# Patient Record
Sex: Male | Born: 1980 | Race: White | Hispanic: No | Marital: Married | Smoking: Former smoker
Health system: Southern US, Community
[De-identification: ages and names within clinical notes are randomized; demographics above are authoritative.]

## PROBLEM LIST (undated history)

## (undated) DIAGNOSIS — K449 Diaphragmatic hernia without obstruction or gangrene: Secondary | ICD-10-CM

## (undated) DIAGNOSIS — F419 Anxiety disorder, unspecified: Secondary | ICD-10-CM

## (undated) DIAGNOSIS — F329 Major depressive disorder, single episode, unspecified: Secondary | ICD-10-CM

## (undated) DIAGNOSIS — R002 Palpitations: Secondary | ICD-10-CM

## (undated) DIAGNOSIS — K200 Eosinophilic esophagitis: Secondary | ICD-10-CM

## (undated) DIAGNOSIS — K219 Gastro-esophageal reflux disease without esophagitis: Secondary | ICD-10-CM

## (undated) DIAGNOSIS — J8410 Pulmonary fibrosis, unspecified: Secondary | ICD-10-CM

## (undated) DIAGNOSIS — F32A Depression, unspecified: Secondary | ICD-10-CM

## (undated) DIAGNOSIS — J449 Chronic obstructive pulmonary disease, unspecified: Secondary | ICD-10-CM

## (undated) DIAGNOSIS — F99 Mental disorder, not otherwise specified: Secondary | ICD-10-CM

## (undated) DIAGNOSIS — F410 Panic disorder [episodic paroxysmal anxiety] without agoraphobia: Secondary | ICD-10-CM

## (undated) DIAGNOSIS — J45909 Unspecified asthma, uncomplicated: Secondary | ICD-10-CM

## (undated) DIAGNOSIS — M1990 Unspecified osteoarthritis, unspecified site: Secondary | ICD-10-CM

## (undated) HISTORY — DX: Unspecified asthma, uncomplicated: J45.909

## (undated) HISTORY — PX: FOREIGN BODY REMOVAL: SHX962

## (undated) HISTORY — DX: Mental disorder, not otherwise specified: F99

## (undated) HISTORY — DX: Unspecified osteoarthritis, unspecified site: M19.90

## (undated) HISTORY — PX: OTHER SURGICAL HISTORY: SHX169

---

## 2004-09-09 ENCOUNTER — Other Ambulatory Visit: Payer: Self-pay

## 2004-09-09 ENCOUNTER — Inpatient Hospital Stay: Payer: Self-pay

## 2005-09-20 ENCOUNTER — Ambulatory Visit: Payer: Self-pay | Admitting: Pulmonary Disease

## 2006-11-09 ENCOUNTER — Ambulatory Visit: Payer: Self-pay | Admitting: Specialist

## 2006-12-02 ENCOUNTER — Ambulatory Visit: Payer: Self-pay | Admitting: Specialist

## 2007-04-07 ENCOUNTER — Emergency Department: Payer: Self-pay | Admitting: Emergency Medicine

## 2009-04-13 ENCOUNTER — Emergency Department: Payer: Self-pay | Admitting: Emergency Medicine

## 2010-05-31 ENCOUNTER — Emergency Department: Payer: Self-pay | Admitting: Emergency Medicine

## 2010-12-18 ENCOUNTER — Emergency Department: Payer: Self-pay | Admitting: Internal Medicine

## 2011-03-07 ENCOUNTER — Emergency Department: Payer: Self-pay | Admitting: Emergency Medicine

## 2011-06-04 ENCOUNTER — Emergency Department: Payer: Self-pay | Admitting: Emergency Medicine

## 2011-09-06 ENCOUNTER — Emergency Department: Payer: Self-pay | Admitting: *Deleted

## 2012-08-18 ENCOUNTER — Emergency Department: Payer: Self-pay | Admitting: Emergency Medicine

## 2012-12-06 ENCOUNTER — Emergency Department (HOSPITAL_COMMUNITY)
Admission: EM | Admit: 2012-12-06 | Discharge: 2012-12-06 | Disposition: A | Discharge status: Home / Self Care | Payer: Medicare Other | Attending: Emergency Medicine | Admitting: Emergency Medicine

## 2012-12-06 ENCOUNTER — Encounter (HOSPITAL_COMMUNITY): Payer: Self-pay | Admitting: Emergency Medicine

## 2012-12-06 DIAGNOSIS — K625 Hemorrhage of anus and rectum: Secondary | ICD-10-CM | POA: Insufficient documentation

## 2012-12-06 DIAGNOSIS — K644 Residual hemorrhoidal skin tags: Secondary | ICD-10-CM | POA: Insufficient documentation

## 2012-12-06 DIAGNOSIS — Z79899 Other long term (current) drug therapy: Secondary | ICD-10-CM | POA: Insufficient documentation

## 2012-12-06 DIAGNOSIS — IMO0002 Reserved for concepts with insufficient information to code with codable children: Secondary | ICD-10-CM | POA: Insufficient documentation

## 2012-12-06 MED ORDER — HYDROCORTISONE 2.5 % RE CREA: TOPICAL_CREAM | RECTAL | Status: DC

## 2012-12-06 NOTE — ED Provider Notes (Signed)
History     CSN: [id number]  Arrival date & time 12/06/12  0205   First MD Initiated Contact with Patient 12/06/12 0231      Chief Complaint  Patient presents with  . Rectal Bleeding    (Consider location/radiation/quality/duration/timing/severity/associated sxs/prior treatment) HPI Steven [name] is a 32 y.o. male who presents to the Emergency Department complaining of rectal pain and bleeding. He has been using Tucs and Preparation H with no relief.  No past medical history on file.  No past surgical history on file.  No family history on file.  History  Substance Use Topics  . Smoking status: Not on file  . Smokeless tobacco: Not on file  . Alcohol Use: Not on file      Review of Systems  Constitutional: Negative for fever.       10 Systems reviewed and are negative for acute change except as noted in the HPI.  HENT: Negative for congestion.   Eyes: Negative for discharge and redness.  Respiratory: Negative for cough and shortness of breath.   Cardiovascular: Negative for chest pain.  Gastrointestinal: Negative for vomiting and abdominal pain.  Genitourinary:       Rectal pain and bleeding.  Musculoskeletal: Negative for back pain.  Skin: Negative for rash.  Neurological: Negative for syncope, numbness and headaches.  Psychiatric/Behavioral:       No behavior change.    Allergies  Review of patient's allergies indicates not on file.  Home Medications   Current Outpatient Rx  Name  Route  Sig  Dispense  Refill  . ACETAMINOPHEN 325 MG PO TABS   Oral   Take 650 mg by mouth every 6 (six) hours as needed.         . ALBUTEROL SULFATE HFA 108 (90 BASE) MCG/ACT IN AERS   Inhalation   Inhale 2 puffs into the lungs every 6 (six) hours as needed.         . ALBUTEROL SULFATE (5 MG/ML) 0.5% IN NEBU   Nebulization   Take 2.5 mg by nebulization every 6 (six) hours as needed.         Maximino Greenland 18-103 MCG/ACT IN AERO   Inhalation   Inhale 2  puffs into the lungs every 6 (six) hours as needed.         . ALPRAZOLAM 1 MG PO TABS   Oral   Take 1 mg by mouth at bedtime as needed.         Marland Kitchen CLONAZEPAM 1 MG PO TABS   Oral   Take 1 mg by mouth 2 (two) times daily as needed.         Marland Kitchen FEXOFENADINE HCL 180 MG PO TABS   Oral   Take 180 mg by mouth daily.         Marland Kitchen FLUTICASONE-SALMETEROL 500-50 MCG/DOSE IN AEPB   Inhalation   Inhale 1 puff into the lungs every 12 (twelve) hours.         . IPRATROPIUM-ALBUTEROL 0.5-2.5 (3) MG/3ML IN SOLN   Nebulization   Take 3 mLs by nebulization.         Marland Kitchen LEVALBUTEROL HCL 1.25 MG/3ML IN NEBU   Nebulization   Take 1.25 mg by nebulization every 4 (four) hours as needed.         Marland Kitchen OMEPRAZOLE 20 MG PO CPDR   Oral   Take 20 mg by mouth daily.         Marland Kitchen PAROXETINE HCL 10 MG PO TABS  Oral   Take 10 mg by mouth every morning.           BP 145/99  Pulse 115  Temp 98 F (36.7 C) (Oral)  Resp 18  Ht 5\' 8"  (1.727 m)  Wt 272 lb (123.378 kg)  BMI 41.36 kg/m2  SpO2 100%  Physical Exam  Nursing note and vitals reviewed. Constitutional:       Awake, alert, nontoxic appearance.  HENT:  Head: Atraumatic.  Eyes: Right eye exhibits no discharge. Left eye exhibits no discharge.  Neck: Neck supple.  Pulmonary/Chest: Effort normal. He exhibits no tenderness.  Abdominal: Soft. There is no tenderness. There is no rebound.  Genitourinary:       Genital and rectal exam performed with pt permission and male ED Tech chaparone present during exam.  Pt examined laying and standing.    Musculoskeletal: He exhibits no tenderness.       Baseline ROM, no obvious new focal weakness.  Neurological:       Mental status and motor strength appears baseline for patient and situation.  Skin: No rash noted.  Psychiatric: He has a normal mood and affect.    ED Course  Procedures (including critical care time)     MDM  Patient with external hemorrhoid that is inflamed, not thrombosed.  Rx for anusol cream. Pt stable in ED with no significant deterioration in condition.The patient appears reasonably screened and/or stabilized for discharge and I doubt any other medical condition or other [facility] requiring further screening, evaluation, or treatment in the ED at this time prior to discharge.  MDM Reviewed: nursing note and vitals           Nicoletta Dress. Colon Branch, MD 12/06/12 [phone]

## 2012-12-06 NOTE — ED Notes (Signed)
Pt was in the shower tonight when he noticed bleeding from his rectum, states he had had problems with hemmorhoids in the past.

## 2013-03-02 ENCOUNTER — Emergency Department: Payer: Self-pay | Admitting: Internal Medicine

## 2013-03-21 DIAGNOSIS — E041 Nontoxic single thyroid nodule: Secondary | ICD-10-CM | POA: Insufficient documentation

## 2013-08-02 DIAGNOSIS — R079 Chest pain, unspecified: Secondary | ICD-10-CM | POA: Insufficient documentation

## 2014-04-03 DIAGNOSIS — Z9109 Other allergy status, other than to drugs and biological substances: Secondary | ICD-10-CM | POA: Insufficient documentation

## 2015-03-17 ENCOUNTER — Encounter: Payer: Self-pay | Admitting: Internal Medicine

## 2015-03-17 ENCOUNTER — Other Ambulatory Visit: Payer: Self-pay | Admitting: *Deleted

## 2015-03-17 ENCOUNTER — Ambulatory Visit (INDEPENDENT_AMBULATORY_CARE_PROVIDER_SITE_OTHER): Payer: Medicare Other | Admitting: Internal Medicine

## 2015-03-17 VITALS — BP 110/88 | HR 86 | Temp 98.2°F | Ht 67.0 in | Wt 287.0 lb

## 2015-03-17 DIAGNOSIS — J4540 Moderate persistent asthma, uncomplicated: Secondary | ICD-10-CM | POA: Diagnosis not present

## 2015-03-17 DIAGNOSIS — E669 Obesity, unspecified: Secondary | ICD-10-CM | POA: Diagnosis not present

## 2015-03-17 DIAGNOSIS — J449 Chronic obstructive pulmonary disease, unspecified: Secondary | ICD-10-CM | POA: Insufficient documentation

## 2015-03-17 DIAGNOSIS — R0609 Other forms of dyspnea: Secondary | ICD-10-CM | POA: Diagnosis not present

## 2015-03-17 MED ORDER — FLUTICASONE FUROATE-VILANTEROL 200-25 MCG/INH IN AEPB: 200.0000 ug | INHALATION_SPRAY | Freq: Every day | RESPIRATORY_TRACT | Status: DC

## 2015-03-17 NOTE — Patient Instructions (Signed)
Follow up with Dr. Dema SeverinMungal in 3 months - pulmonary function testing and 6 minute walk test prior to follow up - BreoEllipta (200mcg)- 1 puff daily, gargle and rinse after each use.  - start walking, 30mins daily at least 3 times a week - avoid any form of smoking (tobacco, ecigs, etc) - stop chewing tobacco  - use your inhalers (maintenance inhalers) as directed - use nebulizer only for rescue purposes - for intense coughing, wheezing, and shortness breath

## 2015-03-17 NOTE — Progress Notes (Signed)
Date: 03/17/2015  MRN# [id number] Steven [name] Dec 24, 1980  Referring Physician: [facility], PA-C  Steven [name] is a 34 y.o. old male seen in consultation for asthma/COPD optimization  CC:  Chief Complaint  Patient presents with  . Advice Only    Pt referred COPD; pt reports cough,sob wheeze all the times.    HPI:  She is a 34 year old male presenting today for optimization of COPD/asthma. He has an extensive history of seen pulmonary and [facility] and at [facility], and the patient insistence for shortness of breath, he's had childhood asthma, and problems with controlling his asthma subsequent diagnosed with COPD along the line, and patient personally told that he should be evaluated for lung transplant in the past. He is currently on Advair 500/50 and Combivent. States that these meds are providing little relief, he is using his nebulizers constantly multiple times throughout the day for shortness of breath. He does have a history of smoking, mildly, in the past, but now is chewing tobacco. He states that he chews 1-1.5 cans per day. He is also noted to have nocturnal hypoxemia and wears 3 L of oxygen at night, this was diagnosed during a split-night study in 2010. He states that his last admission for breathing issues was 4 years ago, he was seen in ED/urgent care earlier this year and was given "couple of breathing treatments." He states that his breathing is worse during the winter, all with rapid seasonal changes. Today he does endorse shortness of breath with exertion, but no cough, no fever, no night sweats. He is currently unemployed, has a pet dull, states that he does not do much exercise or is not that mobile. His father had pulmonary fibrosis, and his mother was diagnosed with COPD.  Patient's chart was reviewed in great detail, 35 minutes spent reviewing records. 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 Also allergies, sees an allergy specialist in [place] [facility], previously on immunotherapy but got sick with each shot and so discontinued. Comorbidities include anxiety, depression, and panic attacks, as well as a cardiac arrhythmia and hypertrophy. No recent hospitalizations - in 2001 was intubated for anaphylactic reaction to benadryl. Now presents to the hospital 2-4x/year with asthma/COPD exacerbation, gets breathing treatments and steroids, has not needed bipap/cpap. In the last 2 years has had frequent presentations to his pcp as well, needing a steroid taper. Had a sleep study for sleep apnea but did not sleep well enough; has been given oxygen for sleeping for 4+ years but no cpap. Still having a lot of trouble with sleeping.  Symptoms are SOB, wheezing, chest tightness. Also notices palpitations seconds - minutes and has reported atypical chest pain. Activity limited to walking halfway through the grocery store (on good days) walking to the bathroom on bad days, and will present to his pcp when he has resting sob.  Pertinent Test Results from [facility]:  CT non con 08/2013 FINDINGS:The partially visualized thyroid gland is unremarkable.Small amount of residual thymus is noted in the anterior mediastinum. Tiny mediastinal nodes are noted. There is no mediastinal, hilar, or axillary lymphadenopathy.The heart size is normal. There is no pericardial effusion.There is no focal airspace consolidation. Dependent atelectasis is noted on both prone and supine images. There is no pneumothorax or pleural effusion. No pulmonary nodules or lung masses are identified.  There is no reticulation, honeycombing, or evidence of interstitial lung disease.The partially visualized upper abdomen is unremarkable.The osseous structures are unremarkable. [facility] No  evidence of interstitial lung disease.  CT chest 2003 No evidence of pulmonary parenchymal disease.   CXR 2012 IMPRESSION: Lungs clear.   PET myocardial perfusion 08/29/2013 - Normal myocardial perfusion study - No evidence for significant ischemia or scar is noted. - During stress: Global systolic function is low normal. The ejection  fraction calculated at 51%.  - No significant coronary calcifications were noted on the attenuation CT  Echo 08/01/2013 Technically suboptimal study due to chest wall and lung interference Contractile left ventricular dysfunction (mild) Segmental contractile left ventricular dysfunction (see detail below) Decreased left ventricular ejection fraction (45-50%) Diastolic left ventricular dysfunction Degenerative mitral valve disease Dilated left atrium Normal right ventricular contractile performance  Steven [name] is a 32 yom with severely decreased activity tolerance and disability 2/2 severe asthma, worsened by comorbid deconditioning, allergies, GERD, and possible sleep apnea.  Pt instructions: 1) Asthma Increase advair 500/50 2puffs twice A day   2) Morbid obesity Pulmonary rehab nutrition  3) allergeis allegra in daytime Start singulair flonase nasl sinus irrigation 2 times a day  4) manage GERD       PMHX:   Past Medical History  Diagnosis Date  . Asthma   . Arthritis     knee  . Chronic mental illness    Surgical Hx:  Past Surgical History  Procedure Laterality Date  . None     Family Hx:  Family History  Problem Relation Age of Onset  . COPD Mother   . Asthma Mother   . Mental illness Mother   . COPD Father   . Pulmonary fibrosis Father   . Lung cancer Father    Social Hx:   History  Substance Use Topics  . Smoking status: Former Smoker -- 0.30 packs/day for 3 years    Types: Cigarettes  . Smokeless tobacco: Current User    Types: Chew     Comment: 2003  . Alcohol Use: No   Medication:    Current Outpatient Rx  Name  Route  Sig  Dispense  Refill  . albuterol (PROVENTIL HFA;VENTOLIN HFA) 108 (90 BASE) MCG/ACT inhaler   Inhalation   Inhale 2 puffs into the lungs every 6 (six) hours as needed.         Marland Kitchen albuterol (PROVENTIL) (5 MG/ML) 0.5% nebulizer solution   Nebulization   Take 2.5 mg by nebulization every 6 (six) hours as needed.         Marland Kitchen albuterol-ipratropium (COMBIVENT) 18-103 MCG/ACT inhaler   Inhalation   Inhale 2 puffs into the lungs every 6 (six) hours as needed.         . clonazePAM (KLONOPIN) 1 MG tablet   Oral   Take 1 mg by mouth 2 (two) times daily as needed.         Marland Kitchen escitalopram (LEXAPRO) 20 MG tablet   Oral   Take 20 mg by mouth daily.         . fexofenadine (ALLEGRA) 180 MG tablet   Oral   Take 180 mg by mouth daily.         . Fluticasone-Salmeterol (ADVAIR) 500-50 MCG/DOSE AEPB   Inhalation   Inhale 1 puff into the lungs every 12 (twelve) hours.         Marland Kitchen ipratropium-albuterol (DUONEB) 0.5-2.5 (3) MG/3ML SOLN   Nebulization   Take  3 mLs by nebulization.         Marland Kitchen. levalbuterol (XOPENEX) 1.25 MG/3ML nebulizer solution   Nebulization   Take 1.25 mg by nebulization every 4 (four) hours as needed.         Marland Kitchen. omeprazole (PRILOSEC) 20 MG capsule   Oral   Take 20 mg by mouth daily.         . traMADol (ULTRAM) 50 MG tablet   Oral   Take 50 mg by mouth as needed.         Marland Kitchen. acetaminophen (TYLENOL) 325 MG tablet   Oral   Take 650 mg by mouth every 6 (six) hours as needed.             Allergies:  Diphenhydramine; Guaifenesin; and Amoxicillin  Review of Systems: Gen:  Denies  fever, sweats, chills HEENT: Denies blurred vision, double vision, ear pain, eye pain, hearing loss, nose bleeds, sore throat Cvc:  No dizziness, chest pain or heaviness Resp:   Denies cough or sputum porduction, shortness of breath Gi: Denies swallowing difficulty, stomach pain, nausea or vomiting, diarrhea, constipation, bowel  incontinence Gu:  Denies bladder incontinence, burning urine Ext:   No Joint pain, stiffness or swelling Skin: No skin rash, easy bruising or bleeding or hives Endoc:  No polyuria, polydipsia , polyphagia or weight change Psych: No depression, insomnia or hallucinations  Other:  All other systems negative  Physical Examination:   VS: BP 110/88 mmHg  Pulse 86  Temp(Src) 98.2 F (36.8 C) (Oral)  Ht 5\' 7"  (1.702 m)  Wt 287 lb (130.182 kg)  BMI 44.94 kg/m2  SpO2 94%  General Appearance: No distress  Neuro:without focal findings, mental status, speech normal, alert and oriented, cranial nerves 2-12 intact, reflexes normal and symmetric, sensation grossly normal  HEENT: PERRLA, EOM intact, no ptosis, no other lesions noticed Pulmonary: normal breath sounds., diaphragmatic excursion normal.No wheezing, No rales;   Sputum Production:  None CardiovascularNormal S1,S2.  No m/r/g.  Abdominal aorta pulsation normal.    Abdomen: Benign, Soft, non-tender, No masses, hepatosplenomegaly, No lymphadenopathy Renal:  No costovertebral tenderness  GU:  No performed at this time. Endoc: No evident thyromegaly, no signs of acromegaly or Cushing features Skin:   warm, no rashes, no ecchymosis  Extremities: normal, no cyanosis, clubbing, no edema, warm with normal capillary refill. Other findings: None  Assessment and Plan:Ramsey L Marvel PlanWhitehead is a 32 yom with severely decreased activity tolerance and disability 2/2 severe asthma, worsened by comorbid deconditioning, allergies, GERD, seen in consultation today for optimization of asthma/COPD. Asthma, chronic Patient's primary problem seems to be with controlling his asthma, and having a good understanding of asthma, its triggers, and control and use of rescue inhalers. He states that he's had childhood asthma and has had difficulty controlling it, [facility], with extensive workup of multiple CAT scans,  obstructive sleep apnea workup, cardiac echo. I believe that immobility, deconditioning, obesity and bad habits such as chewing tobacco and lack of exercise with poor dietary control, it is adding to his asthma issues and making it more difficult to control. Patient visit was educational, and I spent 40 minutes going over his history, current plan of care, and counseling.  Plan: -Pulmonary function testing in 6 to walk test prior to follow-up visit. -Patient states that Advair 500/50 is providing much benefit, will try Breo (200) 9-increased daily walking, at least 3 times per week, -Avoid any form of tobacco  smoking which he may include E cigarettes, cigars, vapors, etc. Stop chewing tobacco Patient was educated extensively in the use of his inhalers, rescue versus maintenance inhaler. He verbalizes understanding of both types. Patient was also educated on when to use his nebulizer, basically for intense coughing, wheezing, and shortness of breath.   Chronic obstructive airway disease with asthma Patient counseled extensively on the use of tobacco,. I believe that he has more asthma versus COPD, will further evaluate with pulmonary function testing.  See plan for asthma.   Obesity OBESITY  Discussed importance of weight reduction.  Educated regarding limitation of  intake of greasy/fried foods.  Instructed on benefit of  a low-impact exercise program, starting slowly.  Discussed benefits of 30-45 minutes of some form of exercise daily as well as benefit of supervised exercise program.  Advised patient to start out slowly, walking 30 minutes, at least 3 times per week.    DOE (dyspnea on exertion) Defect or renal: COPD, asthma, poorly controlled obstructive lung disease, obesity, deconditioning, immobility, poor health maintenance.  See plan for asthma, COPD, obesity.     Updated Medication List Outpatient Encounter Prescriptions as of 03/17/2015  Medication Sig  . albuterol  (PROVENTIL HFA;VENTOLIN HFA) 108 (90 BASE) MCG/ACT inhaler Inhale 2 puffs into the lungs every 6 (six) hours as needed.  Marland Kitchen. albuterol (PROVENTIL) (5 MG/ML) 0.5% nebulizer solution Take 2.5 mg by nebulization every 6 (six) hours as needed.  Marland Kitchen. albuterol-ipratropium (COMBIVENT) 18-103 MCG/ACT inhaler Inhale 2 puffs into the lungs every 6 (six) hours as needed.  . clonazePAM (KLONOPIN) 1 MG tablet Take 1 mg by mouth 2 (two) times daily as needed.  Marland Kitchen. escitalopram (LEXAPRO) 20 MG tablet Take 20 mg by mouth daily.  . fexofenadine (ALLEGRA) 180 MG tablet Take 180 mg by mouth daily.  . Fluticasone-Salmeterol (ADVAIR) 500-50 MCG/DOSE AEPB Inhale 1 puff into the lungs every 12 (twelve) hours.  Marland Kitchen. ipratropium-albuterol (DUONEB) 0.5-2.5 (3) MG/3ML SOLN Take 3 mLs by nebulization.  Marland Kitchen. levalbuterol (XOPENEX) 1.25 MG/3ML nebulizer solution Take 1.25 mg by nebulization every 4 (four) hours as needed.  Marland Kitchen. omeprazole (PRILOSEC) 20 MG capsule Take 20 mg by mouth daily.  . traMADol (ULTRAM) 50 MG tablet Take 50 mg by mouth as needed.  . [DISCONTINUED] PARoxetine (PAXIL) 10 MG tablet Take 10 mg by mouth every morning.  Marland Kitchen. acetaminophen (TYLENOL) 325 MG tablet Take 650 mg by mouth every 6 (six) hours as needed.  . [DISCONTINUED] ALPRAZolam (XANAX) 1 MG tablet Take 1 mg by mouth at bedtime as needed.  . [DISCONTINUED] hydrocortisone (ANUSOL-HC) 2.5 % rectal cream Apply rectally 2 times daily (Patient not taking: Reported on 03/17/2015)   No facility-administered encounter medications on file as of 03/17/2015.    Orders for this visit: Orders Placed This Encounter  Procedures  . Pulmonary function test    Standing Status: Future     Number of Occurrences:      Standing Expiration Date: 03/16/2016    Scheduling Instructions:     [place] office    Order Specific Question:  Where should this test be performed?    Answer:  [facility]    Order Specific Question:  Full PFT: includes the following: basic spirometry,  spirometry pre & post bronchodilator, diffusion capacity (DLCO), lung volumes    Answer:  Full PFT    Order Specific Question:  MIP/MEP    Answer:  No    Order Specific Question:  6 minute walk    Answer:  Yes  Order Specific Question:  ABG    Answer:  No    Order Specific Question:  Diffusion capacity (DLCO)    Answer:  No    Order Specific Question:  Lung volumes    Answer:  No    Order Specific Question:  Methacholine challenge    Answer:  No     Thank  you for the consultation and for allowing [facility] to assist in the care of your patient. Our recommendations are noted above.  Please contact us if we can be of further service.   Stephanie Acre, MD [facility] Office Number: [phone]

## 2015-04-05 DIAGNOSIS — J45909 Unspecified asthma, uncomplicated: Secondary | ICD-10-CM | POA: Insufficient documentation

## 2015-04-05 NOTE — Assessment & Plan Note (Signed)
Patient counseled extensively on the use of tobacco,. I believe that he has more asthma versus COPD, will further evaluate with pulmonary function testing.  See plan for asthma.

## 2015-04-05 NOTE — Assessment & Plan Note (Signed)
OBESITY  Discussed importance of weight reduction.  Educated regarding limitation of  intake of greasy/fried foods.  Instructed on benefit of  a low-impact exercise program, starting slowly.  Discussed benefits of 30-45 minutes of some form of exercise daily as well as benefit of supervised exercise program.  Advised patient to start out slowly, walking 30 minutes, at least 3 times per week.

## 2015-04-05 NOTE — Assessment & Plan Note (Signed)
Patient's primary problem seems to be with controlling his asthma, and having a good understanding of asthma, its triggers, and control and use of rescue inhalers. He states that he's had childhood asthma and has had difficulty controlling it, [facility], with extensive workup of multiple CAT scans, obstructive sleep apnea workup, cardiac echo. I believe that immobility, deconditioning, obesity and bad habits such as chewing tobacco and lack of exercise with poor dietary control, it is adding to his asthma issues and making it more difficult to control. Patient visit was educational, and I spent 40 minutes going over his history, current plan of care, and counseling.  Plan: -Pulmonary function testing in 6 to walk test prior to follow-up visit. -Patient states that Advair 500/50 is providing much benefit, will try Breo (200) 9-increased daily walking, at least 3 times per week, -Avoid any form of tobacco smoking which he may include E cigarettes, cigars, vapors, etc. Stop chewing tobacco Patient was educated extensively in the use of his inhalers, rescue versus maintenance inhaler. He verbalizes understanding of both types. Patient was also educated on when to use his nebulizer, basically for intense coughing, wheezing, and shortness of breath.

## 2015-04-05 NOTE — Assessment & Plan Note (Signed)
Defect or renal: COPD, asthma, poorly controlled obstructive lung disease, obesity, deconditioning, immobility, poor health maintenance.  See plan for asthma, COPD, obesity.

## 2015-05-26 ENCOUNTER — Other Ambulatory Visit: Payer: Self-pay | Admitting: Sports Medicine

## 2015-05-26 DIAGNOSIS — E049 Nontoxic goiter, unspecified: Secondary | ICD-10-CM

## 2015-06-01 ENCOUNTER — Ambulatory Visit
Admission: RE | Admit: 2015-06-01 | Discharge: 2015-06-01 | Disposition: A | Discharge status: Home / Self Care | Payer: Medicare Other | Source: Ambulatory Visit | Attending: Sports Medicine | Admitting: Sports Medicine

## 2015-06-01 DIAGNOSIS — R221 Localized swelling, mass and lump, neck: Secondary | ICD-10-CM | POA: Diagnosis not present

## 2015-06-01 DIAGNOSIS — E049 Nontoxic goiter, unspecified: Secondary | ICD-10-CM | POA: Diagnosis present

## 2015-07-11 ENCOUNTER — Emergency Department (HOSPITAL_COMMUNITY): Payer: Medicare Other

## 2015-07-11 ENCOUNTER — Encounter (HOSPITAL_COMMUNITY): Payer: Self-pay | Admitting: Emergency Medicine

## 2015-07-11 ENCOUNTER — Emergency Department (HOSPITAL_COMMUNITY)
Admission: EM | Admit: 2015-07-11 | Discharge: 2015-07-11 | Disposition: A | Discharge status: Home / Self Care | Payer: Medicare Other | Attending: Emergency Medicine | Admitting: Emergency Medicine

## 2015-07-11 DIAGNOSIS — Z87891 Personal history of nicotine dependence: Secondary | ICD-10-CM | POA: Diagnosis not present

## 2015-07-11 DIAGNOSIS — F99 Mental disorder, not otherwise specified: Secondary | ICD-10-CM | POA: Insufficient documentation

## 2015-07-11 DIAGNOSIS — M1990 Unspecified osteoarthritis, unspecified site: Secondary | ICD-10-CM | POA: Insufficient documentation

## 2015-07-11 DIAGNOSIS — Z79899 Other long term (current) drug therapy: Secondary | ICD-10-CM | POA: Insufficient documentation

## 2015-07-11 DIAGNOSIS — R0602 Shortness of breath: Secondary | ICD-10-CM | POA: Diagnosis present

## 2015-07-11 DIAGNOSIS — Z7951 Long term (current) use of inhaled steroids: Secondary | ICD-10-CM | POA: Insufficient documentation

## 2015-07-11 DIAGNOSIS — J45901 Unspecified asthma with (acute) exacerbation: Secondary | ICD-10-CM

## 2015-07-11 DIAGNOSIS — J441 Chronic obstructive pulmonary disease with (acute) exacerbation: Secondary | ICD-10-CM | POA: Diagnosis not present

## 2015-07-11 HISTORY — DX: Pulmonary fibrosis, unspecified: J84.10

## 2015-07-11 HISTORY — DX: Chronic obstructive pulmonary disease, unspecified: J44.9

## 2015-07-11 MED ORDER — BENZONATATE 100 MG PO CAPS: 100.0000 mg | ORAL_CAPSULE | Freq: Three times a day (TID) | ORAL | Status: DC

## 2015-07-11 MED ORDER — PREDNISONE 20 MG PO TABS: 20.0000 mg | ORAL_TABLET | Freq: Two times a day (BID) | ORAL | Status: DC

## 2015-07-11 MED ORDER — ALBUTEROL (5 MG/ML) CONTINUOUS INHALATION SOLN
10.0000 mg/h | INHALATION_SOLUTION | Freq: Once | RESPIRATORY_TRACT | Status: AC
  Administered 2015-07-11: 10 mg/h via RESPIRATORY_TRACT
  Filled 2015-07-11: qty 20

## 2015-07-11 MED ORDER — METHYLPREDNISOLONE SODIUM SUCC 125 MG IJ SOLR
125.0000 mg | Freq: Once | INTRAMUSCULAR | Status: AC
  Administered 2015-07-11: 125 mg via INTRAVENOUS
  Filled 2015-07-11: qty 2

## 2015-07-11 MED ORDER — METHYLPREDNISOLONE SODIUM SUCC 125 MG IJ SOLR
INTRAMUSCULAR | Status: AC
  Filled 2015-07-11: qty 2

## 2015-07-11 NOTE — ED Notes (Signed)
Respiratory paged for continuous neb tx.

## 2015-07-11 NOTE — ED Provider Notes (Signed)
CSN: [id number]     Arrival date & time 07/11/15  1552 History   First MD Initiated Contact with Patient 07/11/15 1645     Chief Complaint  Patient presents with  . Shortness of Breath      HPI  Patient presents for evaluation of difficulty breathing. Long history of COPD/asthma. States he's been flared up for about 2 days with a dry cough and using home nebulizer and states he only gets temporary and incomplete relief. Dry nonproductive cough. No fever. Some subcostal pain from coughing. No hemoptysis or sputum production. No additional symptoms.  Past Medical History  Diagnosis Date  . Asthma   . Arthritis     knee  . Chronic mental illness   . COPD (chronic obstructive pulmonary disease)   . Pulmonary fibrosis    Past Surgical History  Procedure Laterality Date  . None     Family History  Problem Relation Age of Onset  . COPD Mother   . Asthma Mother   . Mental illness Mother   . COPD Father   . Pulmonary fibrosis Father   . Lung cancer Father    Social History  Substance Use Topics  . Smoking status: Former Smoker -- 0.30 packs/day for 3 years    Types: Cigarettes  . Smokeless tobacco: Current User    Types: Chew     Comment: 2003  . Alcohol Use: No    Review of Systems  Constitutional: Negative for fever, chills, diaphoresis, appetite change and fatigue.  HENT: Negative for mouth sores, sore throat and trouble swallowing.   Eyes: Negative for visual disturbance.  Respiratory: Positive for cough, shortness of breath and wheezing. Negative for chest tightness.   Cardiovascular: Negative for chest pain.  Gastrointestinal: Negative for nausea, vomiting, abdominal pain, diarrhea and abdominal distention.  Endocrine: Negative for polydipsia, polyphagia and polyuria.  Genitourinary: Negative for dysuria, frequency and hematuria.  Musculoskeletal: Negative for gait problem.  Skin: Negative for color change, pallor and rash.  Neurological: Negative for dizziness,  syncope, light-headedness and headaches.  Hematological: Does not bruise/bleed easily.  Psychiatric/Behavioral: Negative for behavioral problems and confusion.      Allergies  Diphenhydramine; Guaifenesin; and Amoxicillin  Home Medications   Prior to Admission medications   Medication Sig Start Date End Date Taking? Authorizing Provider  acetaminophen (TYLENOL) 500 MG tablet Take 1,000 mg by mouth every 6 (six) hours as needed for moderate pain.   Yes Historical Provider, MD  albuterol (PROVENTIL HFA;VENTOLIN HFA) 108 (90 BASE) MCG/ACT inhaler Inhale 2 puffs into the lungs every 6 (six) hours as needed.   Yes Historical Provider, MD  albuterol (PROVENTIL) (5 MG/ML) 0.5% nebulizer solution Take 2.5 mg by nebulization every 6 (six) hours as needed.   Yes Historical Provider, MD  albuterol-ipratropium (COMBIVENT) 18-103 MCG/ACT inhaler Inhale 2 puffs into the lungs every 6 (six) hours as needed.   Yes Historical Provider, MD  clonazePAM (KLONOPIN) 1 MG tablet Take 1 mg by mouth 2 (two) times daily as needed.   Yes Historical Provider, MD  escitalopram (LEXAPRO) 20 MG tablet Take 20 mg by mouth daily. 02/18/15  Yes Historical Provider, MD  fexofenadine (ALLEGRA) 180 MG tablet Take 180 mg by mouth daily.   Yes Historical Provider, MD  fluticasone (FLONASE) 50 MCG/ACT nasal spray Place 1 spray into both nostrils daily. 06/23/15  Yes Historical Provider, MD  Fluticasone-Salmeterol (ADVAIR) 500-50 MCG/DOSE AEPB Inhale 1 puff into the lungs every 12 (twelve) hours.   Yes Historical Provider,  MD  ipratropium-albuterol (DUONEB) 0.5-2.5 (3) MG/3ML SOLN Take 3 mLs by nebulization every 4 (four) hours as needed (Shortness of Breath).    Yes Historical Provider, MD  omeprazole (PRILOSEC) 20 MG capsule Take 20 mg by mouth daily.   Yes Historical Provider, MD  benzonatate (TESSALON) 100 MG capsule Take 1 capsule (100 mg total) by mouth every 8 (eight) hours. 07/11/15   Rolland Porter, MD  Fluticasone  Furoate-Vilanterol (BREO ELLIPTA) 200-25 MCG/INH AEPB Inhale 200 mcg into the lungs daily. Rinse and gargle after each use. 03/17/15   Vishal Mungal, MD  predniSONE (DELTASONE) 20 MG tablet Take 1 tablet (20 mg total) by mouth 2 (two) times daily with a meal. 07/11/15   Rolland Porter, MD  traMADol (ULTRAM) 50 MG tablet Take 50 mg by mouth as needed. 02/16/15   Historical Provider, MD   BP 105/56 mmHg  Pulse 85  Temp(Src) 98.2 F (36.8 C) (Oral)  Resp 16  Ht 5\' 7"  (1.702 m)  Wt 270 lb (122.471 kg)  BMI 42.28 kg/m2  SpO2 100% Physical Exam  Constitutional: He is oriented to person, place, and time. He appears well-developed and well-nourished. No distress.  HENT:  Head: Normocephalic.  Eyes: Conjunctivae are normal. Pupils are equal, round, and reactive to light. No scleral icterus.  Neck: Normal range of motion. Neck supple. No thyromegaly present.  Cardiovascular: Normal rate and regular rhythm.  Exam reveals no gallop and no friction rub.   No murmur heard. Pulmonary/Chest: Effort normal. No accessory muscle usage. No respiratory distress. He has wheezes. He has no rales.  Globally diminished breath sounds with wheezing and prolongation in all fields.  Abdominal: Soft. Bowel sounds are normal. He exhibits no distension. There is no tenderness. There is no rebound.  Musculoskeletal: Normal range of motion.  Neurological: He is alert and oriented to person, place, and time.  Skin: Skin is warm and dry. No rash noted.  Psychiatric: He has a normal mood and affect. His behavior is normal.    ED Course  Procedures (including critical care time) Labs Review Labs Reviewed - No data to display  Imaging Review Dg Chest 2 View  07/11/2015   CLINICAL DATA:  Shortness of breath and dry cough since yesterday. History of asthma.  EXAM: CHEST  2 VIEW  COMPARISON:  None.  FINDINGS: The heart size and mediastinal contours are within normal limits. Both lungs are clear. The visualized skeletal  structures are unremarkable. Mild scarring at the LEFT base without effusion is chronic.  IMPRESSION: No active cardiopulmonary disease.   Electronically Signed   By: Elsie Stain M.D.   On: 07/11/2015 16:50   I have personally reviewed and evaluated these images and lab results as part of my medical decision-making.   EKG Interpretation None      MDM   Final diagnoses:  Asthma exacerbation    Plan chest x-ray, IV steroids, nebulized albuterol continuous over one hour. Reevaluation.  CRITICAL CARE Performed by: Rolland Porter JOSEPH   Total critical care time: 60 minutes of continuous albuterol nebulizer.  Critical care time was exclusive of separately billable procedures and treating other patients.  Critical care was necessary to treat or prevent imminent or life-threatening deterioration.  Critical care was time spent personally by me on the following activities: development of treatment plan with patient and/or surrogate as well as nursing, discussions with consultants, evaluation of patient's response to treatment, examination of patient, obtaining history from patient or surrogate, ordering and performing treatments and  interventions, ordering and review of laboratory studies, ordering and review of radiographic studies, pulse oximetry and re-evaluation of patient's condition.   18:52:  Patient underwent one hour of continuous nebulized albuterol and IV sterile 8. X-ray shows no acute findings. Clear lungs well oxygenated area and good symptom relief. He is appropriate for discharge home. Prescription prednisone, Tessalon for cough, primary care follow-up.  Rolland Porter, MD 07/11/15 [phone]

## 2015-07-11 NOTE — ED Notes (Signed)
MD James at bedside.  

## 2015-07-11 NOTE — Discharge Instructions (Signed)

## 2015-07-11 NOTE — ED Notes (Signed)
Went into room to discharge pt, pt states that he feels the same as when he came into the er, Dr Fayrene Fearing notified, in room to speak with pt,

## 2015-07-11 NOTE — ED Notes (Signed)
Pt c/o sob and dry cough since yesterday. No relief from home nebulizer. H/s asthma/copd.

## 2015-08-31 ENCOUNTER — Ambulatory Visit: Payer: Medicare Other

## 2015-08-31 ENCOUNTER — Ambulatory Visit: Payer: Medicare Other | Admitting: Internal Medicine

## 2015-11-16 ENCOUNTER — Ambulatory Visit (INDEPENDENT_AMBULATORY_CARE_PROVIDER_SITE_OTHER): Payer: Medicare Other | Admitting: *Deleted

## 2015-11-16 DIAGNOSIS — R0609 Other forms of dyspnea: Secondary | ICD-10-CM | POA: Diagnosis not present

## 2015-11-16 DIAGNOSIS — J4540 Moderate persistent asthma, uncomplicated: Secondary | ICD-10-CM

## 2015-11-16 LAB — PULMONARY FUNCTION TEST
DL/VA % pred: 112 %
DL/VA: 4.97 ml/min/mmHg/L
DLCO UNC % PRED: 101 %
DLCO UNC: 27.37 ml/min/mmHg
FEF 25-75 Post: 1.14 L/sec
FEF 25-75 Pre: 0.77 L/sec
FEF2575-%CHANGE-POST: 48 %
FEF2575-%PRED-POST: 28 %
FEF2575-%PRED-PRE: 19 %
FEV1-%CHANGE-POST: 20 %
FEV1-%PRED-POST: 51 %
FEV1-%Pred-Pre: 42 %
FEV1-POST: 2 L
FEV1-Pre: 1.66 L
FEV1FVC-%CHANGE-POST: 10 %
FEV1FVC-%Pred-Pre: 61 %
FEV6-%Change-Post: 11 %
FEV6-%PRED-PRE: 69 %
FEV6-%Pred-Post: 77 %
FEV6-PRE: 3.28 L
FEV6-Post: 3.67 L
FEV6FVC-%Change-Post: 2 %
FEV6FVC-%PRED-PRE: 98 %
FEV6FVC-%Pred-Post: 101 %
FVC-%Change-Post: 9 %
FVC-%PRED-PRE: 70 %
FVC-%Pred-Post: 76 %
FVC-PRE: 3.36 L
FVC-Post: 3.67 L
POST FEV1/FVC RATIO: 54 %
POST FEV6/FVC RATIO: 100 %
Pre FEV1/FVC ratio: 49 %
Pre FEV6/FVC Ratio: 98 %
RV % PRED: 106 %
RV: 1.55 L
TLC % pred: 91 %
TLC: 5.61 L

## 2015-11-16 NOTE — Progress Notes (Signed)
PFT performed today. 

## 2015-11-16 NOTE — Progress Notes (Signed)
SMW performed today. 

## 2015-11-17 ENCOUNTER — Ambulatory Visit: Payer: Medicare Other | Admitting: Internal Medicine

## 2015-11-19 ENCOUNTER — Encounter: Payer: Self-pay | Admitting: *Deleted

## 2015-11-19 ENCOUNTER — Ambulatory Visit: Payer: Medicare Other | Admitting: Internal Medicine

## 2015-12-10 ENCOUNTER — Ambulatory Visit (INDEPENDENT_AMBULATORY_CARE_PROVIDER_SITE_OTHER): Payer: Medicare Other | Admitting: Internal Medicine

## 2015-12-10 ENCOUNTER — Encounter: Payer: Self-pay | Admitting: Internal Medicine

## 2015-12-10 VITALS — BP 122/70 | HR 110 | Ht 66.0 in | Wt 276.8 lb

## 2015-12-10 DIAGNOSIS — R0609 Other forms of dyspnea: Secondary | ICD-10-CM

## 2015-12-10 DIAGNOSIS — J4540 Moderate persistent asthma, uncomplicated: Secondary | ICD-10-CM

## 2015-12-10 DIAGNOSIS — E669 Obesity, unspecified: Secondary | ICD-10-CM

## 2015-12-10 MED ORDER — FLUTICASONE FUROATE-VILANTEROL 200-25 MCG/INH IN AEPB: 1.0000 | INHALATION_SPRAY | Freq: Every day | RESPIRATORY_TRACT | Status: AC

## 2015-12-10 MED ORDER — FLUTICASONE FUROATE-VILANTEROL 200-25 MCG/INH IN AEPB: 1.0000 | INHALATION_SPRAY | Freq: Every day | RESPIRATORY_TRACT | Status: DC

## 2015-12-10 NOTE — Patient Instructions (Addendum)
Follow up with Dr. Dema Severin in: 6 weeks - stop Advair, combivent, atrovent - we will start you on BreoEllipta (200/25)- 1puff daily, gargle and rinse after each use - please limit the use of albuterol?Duoneb to urgent cases - wheezing and significant shortness of breath. Continued use of albuterol can cause palpitations - keep follow up with your cardiologist at [facility] - per records you have an appt on 12/15/15 with Dr. Morton Peters, and again on 12/31/15 with Dr. Jaymes Graff - we will make a referral for you to start Pulmonary Rehab  - please try to start this prior to your next visit.

## 2015-12-10 NOTE — Assessment & Plan Note (Signed)
OBESITY  Discussed importance of weight reduction.  Educated regarding limitation of  intake of greasy/fried foods.  Instructed on benefit of  a low-impact exercise program, starting slowly.  Discussed benefits of 30-45 minutes of some form of exercise daily as well as benefit of supervised exercise program.  Advised patient to start out slowly, walking 30 minutes, at least 3 times per week.  

## 2015-12-10 NOTE — Progress Notes (Signed)
. Date: 12/10/2015  MRN# [id number] Steven [name] October 14, 1981  Referring Physician: [facility], PA-C  Steven [name] is a 35 y.o. old male seen in consultation for asthma/COPD optimization  CC:  Chief Complaint  Patient presents with  . Follow-up    pt. states breathing has worsen. increased SOB X3d occ. chest pain/tightness X1wk. occ. wheezing .     HPI:  She is a 35 year old male presenting today for optimization of COPD/asthma. He has an extensive history of seen [name] and [facility] and at [facility], and the patient insistence for shortness of breath, he's had childhood asthma, and problems with controlling his asthma subsequent diagnosed with COPD along the line, and patient personally told that he should be evaluated for lung transplant in the past. He is currently on Advair 500/50 and Combivent. States that these meds are providing little relief, he is using his nebulizers constantly multiple times throughout the day for shortness of breath. He does have a history of smoking, mildly, in the past, but now is chewing tobacco. He states that he chews 1-1.5 cans per day. He is also noted to have nocturnal hypoxemia and wears 3 L of oxygen at night, this was diagnosed during a split-night study in 2010. He states that his last admission for breathing issues was 4 years ago, he was seen in ED/urgent [name] earlier this year and was given "couple of breathing treatments." He states that his breathing is worse during the winter, all with rapid seasonal changes. Today he does endorse shortness of breath with exertion, but no cough, no fever, no night sweats. He is currently unemployed, has a pet dull, states that he does not do much exercise or is not that mobile. His father had [name] fibrosis, and his mother was diagnosed with COPD.  Patient's chart was reviewed in great detail, 35 minutes spent reviewing records. 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 Also allergies, sees an allergy specialist in [facility], previously on immunotherapy but got sick with each shot and so discontinued. Comorbidities include anxiety, depression, and panic attacks, as well as a cardiac arrhythmia and hypertrophy. No recent hospitalizations - in 2001 was intubated for anaphylactic reaction to benadryl. Now presents to the hospital 2-4x/year with asthma/COPD exacerbation, gets breathing treatments and steroids, has not needed bipap/cpap. In the last 2 years has had frequent presentations to his pcp as well, needing a steroid taper. Had a sleep study for sleep apnea but did not sleep well enough; has been given oxygen for sleeping for 4+ years but no cpap. Still having a lot of trouble with sleeping.  Symptoms are SOB, wheezing, chest tightness. Also notices palpitations seconds - minutes and has reported atypical chest pain. Activity limited to walking halfway through the grocery store (on good days) walking to the bathroom on bad days, and will present to his pcp when he has resting sob.  Plan - con't with inhalers, asthma education, proper use of albuterol, diet and weight loss.   Pertinent Test Results from [facility]:  CT non con 08/2013 FINDINGS:The partially visualized thyroid gland is unremarkable.Small amount of residual thymus is noted in the anterior mediastinum. Tiny mediastinal nodes are noted. There is no mediastinal, hilar, or axillary lymphadenopathy.The heart size is normal. There is no pericardial effusion.There is no focal airspace consolidation. Dependent atelectasis  is noted on both prone and supine images. There is no pneumothorax or pleural effusion. No [name] nodules or lung masses are identified. There is no reticulation, honeycombing, or evidence of interstitial lung disease.The  partially visualized upper abdomen is unremarkable.The osseous structures are unremarkable. [facility] No evidence of interstitial lung disease.  CT chest 2003 No evidence of [name] parenchymal disease.   CXR 2012 IMPRESSION: Lungs clear.   PET myocardial perfusion 08/29/2013 - Normal myocardial perfusion study - No evidence for significant ischemia or scar is noted. - During stress: Global systolic function is low normal. The ejection  fraction calculated at 51%.  - No significant coronary calcifications were noted on the attenuation CT  Echo 08/01/2013 Technically suboptimal study due to chest wall and lung interference Contractile left ventricular dysfunction (mild) Segmental contractile left ventricular dysfunction (see detail below) Decreased left ventricular ejection fraction (45-50%) Diastolic left ventricular dysfunction Degenerative mitral valve disease Dilated left atrium Normal right ventricular contractile performance  Steven [name] is a 32 yom with severely decreased activity tolerance and disability 2/2 severe asthma, worsened by comorbid deconditioning, allergies, GERD, and possible sleep apnea.  Pt instructions: 1) Asthma Increase advair 500/50 2puffs twice A day   2) Morbid obesity [name] rehab nutrition  3) allergeis allegra in daytime Start singulair flonase nasl sinus irrigation 2 times a day  4) manage GERD  EVENTS since last clinic visit: Patient presents today for a follow-up visit, of his asthma. Since his last visit he's had one visit to the urgent [name], by review of chart, for shortness of breath, possible upper story tract infection. He tells me he is seeing his cardiologist at [facility] for chest pain and palpitations last week, they have started him on a blood pressure medication (lisinopril), and further workup to include Holter monitor and echocardiogram. Review of records showed he had  a stress test about a year ago that was normal. He states that he is currently using Advair as prescribed 1 puff twice a day, he has a prescription for Combivent which she uses every night 2 puffs, at times he will supplement his Combivent with albuterol inhaler and Atrovent inhaler at least 2-3 times per day, he does not use albuterol nebulizer, and he still using DuoNeb's throughout the day and Combivent throughout the day. He has tried going to the gym, but once he got a job he stopped going. He still endorses chronic shortness of breath, that has been worse over the past 3 days, this associated with palpitations and chest pain, he has increased his use of beta adrenergic drugs over the last 1 week. In the past, his pulmonologist at [facility] has made referrals for him to attend [name] rehabilitation, but felt unknown reasons he has not been. Today he denies any fever, chills, significant wheezing, recent sick contacts. Today he states that he's been also using his albuterol inhaler at least 6-8 times per day for the last 3 days, inaddition to what is stated above.    PMHX:   Past Medical History  Diagnosis Date  . Asthma   . Arthritis     knee  . Chronic mental illness   . COPD (chronic obstructive [name] disease) (HCC)   . [name] fibrosis (HCC)    Surgical Hx:  Past Surgical History  Procedure Laterality Date  . None     Family Hx:  Family History  Problem Relation Age of Onset  . COPD Mother   . Asthma  Mother   . Mental illness Mother   . COPD Father   . [name] fibrosis Father   . Lung cancer Father    Social Hx:   Social History  Substance Use Topics  . Smoking status: Former Smoker -- 0.30 packs/day for 3 years    Types: Cigarettes  . Smokeless tobacco: Current User    Types: Chew     Comment: 2003  . Alcohol Use: No   Medication:   Current Outpatient Rx  Name  Route  Sig  Dispense  Refill  . acetaminophen (TYLENOL) 500 MG tablet   Oral    Take 1,000 mg by mouth every 6 (six) hours as needed for moderate pain.         Marland Kitchen albuterol (PROVENTIL HFA;VENTOLIN HFA) 108 (90 BASE) MCG/ACT inhaler   Inhalation   Inhale 2 puffs into the lungs every 6 (six) hours as needed.         Marland Kitchen albuterol (PROVENTIL) (5 MG/ML) 0.5% nebulizer solution   Nebulization   Take 2.5 mg by nebulization every 6 (six) hours as needed.         . clonazePAM (KLONOPIN) 1 MG tablet   Oral   Take 1 mg by mouth 2 (two) times daily as needed.         Marland Kitchen EPIPEN 2-PAK 0.3 MG/0.3ML SOAJ injection                 Dispense as written.   . escitalopram (LEXAPRO) 20 MG tablet   Oral   Take 20 mg by mouth daily.         . fexofenadine (ALLEGRA) 180 MG tablet   Oral   Take 180 mg by mouth daily.         . fluticasone (FLONASE) 50 MCG/ACT nasal spray   Each Nare   Place 1 spray into both nostrils daily.         Marland Kitchen ipratropium-albuterol (DUONEB) 0.5-2.5 (3) MG/3ML SOLN   Nebulization   Take 3 mLs by nebulization every 4 (four) hours as needed (Shortness of Breath).          Marland Kitchen lisinopril (PRINIVIL,ZESTRIL) 10 MG tablet   Oral   Take 5 mg by mouth.         . meloxicam (MOBIC) 7.5 MG tablet               . omeprazole (PRILOSEC) 20 MG capsule   Oral   Take 20 mg by mouth daily.         . Fluticasone Furoate-Vilanterol (BREO ELLIPTA) 200-25 MCG/INH AEPB   Inhalation   Inhale 1 puff into the lungs daily.   14 each   0   . Fluticasone Furoate-Vilanterol (BREO ELLIPTA) 200-25 MCG/INH AEPB   Inhalation   Inhale 1 puff into the lungs daily.   60 each   5       Allergies:  Diphenhydramine; Guaifenesin; and Amoxicillin  Review of Systems: Gen:  Denies  fever, sweats, chills HEENT: Denies blurred vision, double vision, ear pain, eye pain, hearing loss, nose bleeds, sore throat Cvc:  Palpitations and chest pain Resp:   Denies cough or sputum porduction, shortness of breath Gi: Denies swallowing difficulty, stomach pain,  nausea or vomiting, diarrhea, constipation, bowel incontinence Gu:  Denies bladder incontinence, burning urine Ext:   No Joint pain, stiffness or swelling Skin: No skin rash, easy bruising or bleeding or hives Endoc:  No polyuria, polydipsia , polyphagia or weight change Psych: No  depression, insomnia or hallucinations  Other:  All other systems negative  Physical Examination:   VS: BP 122/70 mmHg  Pulse 110  Ht 5\' 6"  (1.676 m)  Wt 276 lb 12.8 oz (125.556 kg)  BMI 44.70 kg/m2  SpO2 94%  General Appearance: No distress  Neuro:without focal findings, mental status, speech normal, alert and oriented, cranial nerves 2-12 intact, reflexes normal and symmetric, sensation grossly normal  HEENT: PERRLA, EOM intact, no ptosis, no other lesions noticed [name]: normal breath sounds., diaphragmatic excursion normal.No wheezing, No rales;   Sputum Production:  None CardiovascularNormal S1,S2.  No m/r/g.  Abdominal aorta pulsation normal.    Abdomen: Benign, Soft, non-tender, No masses, hepatosplenomegaly, No lymphadenopathy Renal:  No costovertebral tenderness  GU:  No performed at this time. Endoc: No evident thyromegaly, no signs of acromegaly or Cushing features Skin:   warm, no rashes, no ecchymosis  Extremities: normal, no cyanosis, clubbing, no edema, warm with normal capillary refill. Other findings: None  Testing reviewed by Dr. Dema Severin on 12/10/15 walk test -11/16/15-  distance 312, lowest sat 94%, comments - walk at moderate pace, complained of chest tightness at the end of the test  PFT 11/16/15 at this time by ATS criteria, is evident for:  Moderate to severe obstruction with significant bronchodilator response, no significant restrictive defects noted.   Pre-Bronch       Post-Bronch     FEV1: Actual 1.66   Predicted 42%  Actual 2.00 Pred 51%  20%Chg  FEV1/FVC: Actual 49   Predicted 61%  Actual 54 Pred 67%  10%Chg  RV: Actual 1.55   Predicted 106%     TLC: Actual  5.61   Predicted 91%     RV/TLC ([place])(%): Actual 28 Predicted 115%    DLCO2:cor: Actual 26.95  Predicted 101%    Assessment and Plan:Steven [name] is a 32 yom with severely decreased activity tolerance and disability 2/2 severe asthma, worsened by comorbid deconditioning, allergies, GERD, seen in consultation today for optimization of asthma/obstructive disease.   Asthma, chronic Patient's primary problem seems to be with controlling his asthma, and having a good understanding of asthma, its triggers, and control and use of rescue inhalers is paramount to his overall [name] status.   He states that he's had childhood asthma and has had difficulty controlling it, he has been followed extensively at [facility] and [facility], with extensive workup of multiple CAT scans, obstructive sleep apnea workup, cardiac echo. I believe that immobility, deconditioning, obesity and bad habits such as chewing tobacco and lack of exercise with poor dietary control, it is adding to his asthma issues and making it more difficult to control. 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 At today's visit I have stopped his Combivent inhaler and his Atrovent in an attempt to start weaning him off of short acting bronchodilators. [name] rehabilitation referral also made FEV1 42%, FEV1/FVC 49% 20% bronchodilator change after albuterol given during [name] function testing: This shows moderate to severe obstruction with significant bronchodilator response.  Plan: -Stop Combivent, stop Atrovent -Stop Advair, start Brio 200/25, 1 puff daily Carl and rinse after each use. -Avoid any form of tobacco smoking which he may  include E cigarettes, cigars, vapors, etc. -Referral  to [name] debilitation, if unable to attend, patient recommended to join a gym and start of her cardiovascular exercises at a slow pace and gradually increase -Diet, healthy weight loss, exercise  Stop chewing tobacco Patient was educated extensively in the use of his inhalers, rescue versus maintenance inhaler. He verbalizes understanding of both types. Patient was also educated on when to use his nebulizer, basically for intense coughing, wheezing, and shortness of breath.    DOE (dyspnea on exertion) Multifactorial: asthma, poorly controlled obstructive lung disease, obesity, deconditioning, immobility, poor health maintenance.  See plan for asthma    Obesity OBESITY  Discussed importance of weight reduction.  Educated regarding limitation of  intake of greasy/fried foods.  Instructed on benefit of  a low-impact exercise program, starting slowly.  Discussed benefits of 30-45 minutes of some form of exercise daily as well as benefit of supervised exercise program.  Advised patient to start out slowly, walking 30 minutes, at least 3 times per week.       Updated Medication List Outpatient Encounter Prescriptions as of 12/10/2015  Medication Sig  . acetaminophen (TYLENOL) 500 MG tablet Take 1,000 mg by mouth every 6 (six) hours as needed for moderate pain.  Marland Kitchen albuterol (PROVENTIL HFA;VENTOLIN HFA) 108 (90 BASE) MCG/ACT inhaler Inhale 2 puffs into the lungs every 6 (six) hours as needed.  Marland Kitchen albuterol (PROVENTIL) (5 MG/ML) 0.5% nebulizer solution Take 2.5 mg by nebulization every 6 (six) hours as needed.  . clonazePAM (KLONOPIN) 1 MG tablet Take 1 mg by mouth 2 (two) times daily as needed.  Marland Kitchen EPIPEN 2-PAK 0.3 MG/0.3ML SOAJ injection   . escitalopram (LEXAPRO) 20 MG tablet Take 20 mg by mouth daily.  . fexofenadine (ALLEGRA) 180 MG tablet Take 180 mg by mouth daily.  . fluticasone (FLONASE) 50 MCG/ACT nasal spray Place 1 spray  into both nostrils daily.  Marland Kitchen ipratropium-albuterol (DUONEB) 0.5-2.5 (3) MG/3ML SOLN Take 3 mLs by nebulization every 4 (four) hours as needed (Shortness of Breath).   Marland Kitchen lisinopril (PRINIVIL,ZESTRIL) 10 MG tablet Take 5 mg by mouth.  . meloxicam (MOBIC) 7.5 MG tablet   . omeprazole (PRILOSEC) 20 MG capsule Take 20 mg by mouth daily.  . Fluticasone Furoate-Vilanterol (BREO ELLIPTA) 200-25 MCG/INH AEPB Inhale 1 puff into the lungs daily.  . Fluticasone Furoate-Vilanterol (BREO ELLIPTA) 200-25 MCG/INH AEPB Inhale 1 puff into the lungs daily.  . [DISCONTINUED] albuterol-ipratropium (COMBIVENT) 18-103 MCG/ACT inhaler Inhale 2 puffs into the lungs every 6 (six) hours as needed. Reported on 12/10/2015  . [DISCONTINUED] benzonatate (TESSALON) 100 MG capsule Take 1 capsule (100 mg total) by mouth every 8 (eight) hours. (Patient not taking: Reported on 12/10/2015)  . [DISCONTINUED] Fluticasone Furoate-Vilanterol (BREO ELLIPTA) 200-25 MCG/INH AEPB Inhale 200 mcg into the lungs daily. Rinse and gargle after each use. (Patient not taking: Reported on 12/10/2015)  . [DISCONTINUED] Fluticasone-Salmeterol (ADVAIR) 500-50 MCG/DOSE AEPB Inhale 1 puff into the lungs every 12 (twelve) hours. Reported on 12/10/2015  . [DISCONTINUED] predniSONE (DELTASONE) 20 MG tablet Take 1 tablet (20 mg total) by mouth 2 (two) times daily with a meal. (Patient not taking: Reported on 12/10/2015)  . [DISCONTINUED] traMADol (ULTRAM) 50 MG tablet Take 50 mg by mouth as needed. Reported on 12/10/2015   No facility-administered encounter medications on file as of 12/10/2015.    Orders for this visit: Orders Placed This Encounter  Procedures  . [name] rehab therapeutic exercise    Standing Status: Future     Number of Occurrences:  Standing Expiration Date: 12/09/2016     Thank  you for the consultation and for allowing Steven [name], Steven [name] to assist in the [name] of your patient. Our recommendations are noted above.  Please  contact us if we can be of further service.   Stephanie Acre, MD [name] Office Number: [phone]

## 2015-12-10 NOTE — Assessment & Plan Note (Addendum)
Patient's primary problem seems to be with controlling his asthma, and having a good understanding of asthma, its triggers, and control and use of rescue inhalers is paramount to his overall pulmonary status.   He states that he's had childhood asthma and has had difficulty controlling it, he has been followed extensively at [facility] and [facility], with extensive workup of multiple CAT scans, obstructive sleep apnea workup, cardiac echo. I believe that immobility, deconditioning, obesity and bad habits such as chewing tobacco and lack of exercise with poor dietary control, it is adding to his asthma issues and making it more difficult to control. 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 At today's visit I have stopped his Combivent inhaler and his Atrovent in an attempt to start weaning him off of short acting bronchodilators. Pulmonary rehabilitation referral also made FEV1 42%, FEV1/FVC 49% 20% bronchodilator change after albuterol given during pulmonary function testing: This shows moderate to severe obstruction with significant bronchodilator response.  Plan: -Stop Combivent, stop Atrovent -Stop Advair, start Brio 200/25, 1 puff daily Carl and rinse after each use. -Avoid any form of tobacco smoking which he may include E cigarettes, cigars, vapors, etc. -Referral to pulmonary debilitation, if unable to attend, patient recommended to join a gym and start of her cardiovascular exercises at a slow pace and gradually increase -Diet, healthy weight loss, exercise  Stop chewing tobacco Patient was educated extensively in the use of his inhalers, rescue versus maintenance inhaler. He verbalizes  understanding of both types. Patient was also educated on when to use his nebulizer, basically for intense coughing, wheezing, and shortness of breath.

## 2015-12-10 NOTE — Assessment & Plan Note (Signed)
Multifactorial: asthma, poorly controlled obstructive lung disease, obesity, deconditioning, immobility, poor health maintenance.  See plan for asthma

## 2016-01-13 ENCOUNTER — Encounter (HOSPITAL_COMMUNITY): Admission: RE | Admit: 2016-01-13 | Payer: Medicare Other | Source: Ambulatory Visit

## 2016-01-19 ENCOUNTER — Encounter (HOSPITAL_COMMUNITY): Payer: Medicare Other

## 2016-02-01 ENCOUNTER — Ambulatory Visit (INDEPENDENT_AMBULATORY_CARE_PROVIDER_SITE_OTHER): Payer: Medicare Other | Admitting: Internal Medicine

## 2016-02-01 ENCOUNTER — Encounter: Payer: Self-pay | Admitting: Internal Medicine

## 2016-02-01 VITALS — BP 128/76 | HR 75 | Ht 68.0 in | Wt 277.4 lb

## 2016-02-01 DIAGNOSIS — J4540 Moderate persistent asthma, uncomplicated: Secondary | ICD-10-CM

## 2016-02-01 NOTE — Assessment & Plan Note (Signed)
Patient's primary problem seems to be with controlling his asthma, and having a good understanding of asthma, its triggers, and control and use of rescue inhalers is paramount to his overall pulmonary status.   He states that he's had childhood asthma and has had difficulty controlling it, [facility], with extensive workup of multiple CAT scans, obstructive sleep apnea workup, cardiac echo. I believe that immobility, deconditioning, obesity and bad habits such as chewing tobacco and lack of exercise with poor dietary control, it is adding to his asthma issues and making it more difficult to control. 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 At his last visit  I stopped his Combivent inhaler and his Atrovent in an attempt to start weaning him off of short acting bronchodilators, but he has continue to use.  There might be an anxiety component to his beta agonist dependence, however, he did state that Virgel BouquetBreo is helping, he has cut down combivent nebs to 2 times per day, and albuterol inhaler to every 4 hours.  FEV1 42%, FEV1/FVC 49% 20% bronchodilator change after albuterol given during pulmonary function testing: This shows moderate to severe obstruction with significant bronchodilator response.  Plan: -again Stop Combivent, stop Atrovent -cont with Breo 200/25, 1 puff daily gargle and rinse after each use. -Avoid any form of tobacco smoking which he may include E cigarettes, cigars, vapors, etc. -keep appointment with pulmonary rehab.  -Diet, healthy weight loss, exercise  -Stop chewing tobacco Patient was educated extensively in the use of his inhalers, rescue versus  maintenance inhaler. He verbalizes understanding of both types. Patient was also educated on when to use his nebulizer, basically for intense coughing, wheezing, and shortness of breath.

## 2016-02-01 NOTE — Progress Notes (Signed)
. Date: 02/01/2016  MRN# [id number] Steven [name] 01-26-1981  Referring Physician: [facility], PA-C  Steven [name] is a 35 y.o. old male seen in consultation for asthma/COPD optimization  CC:  Chief Complaint  Patient presents with  . Follow-up    pt. states breathing has improved. c/o SOB, wheezing @ bedtime, chest pain. denies cough.     EVENTS since last clinic visit: Patient presents today for a follow-up visit, of his asthma. At his last visit we discussed the proper use of rescue inhaler and rescue nebulizer. I advised him to only use nebulizers and inhalers as needed for severe shortness of breath, coughing, wheezing. He states he still using Combivent nebulizer about twice per day, and albuterol inhaler at least 4 times per day for a sensation of shortness of breath and intermittent cough that is nonproductive. Also he recently had a newborn baby girl, this is his third child. 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 He reschedule pulmonary rehabilitation, which will start in the next 1-2 weeks.    PMHX:   Past Medical History  Diagnosis Date  . Asthma   . Arthritis     knee  . Chronic mental illness   . COPD (chronic obstructive pulmonary disease) (HCC)   . Pulmonary fibrosis (HCC)    Surgical Hx:  Past Surgical History  Procedure Laterality Date  . None     Family Hx:  Family History  Problem Relation Age of Onset  . COPD Mother   . Asthma Mother   . Mental illness Mother   . COPD Father   . Pulmonary fibrosis Father   . Lung cancer Father    Social Hx:   Social History  Substance Use Topics  . Smoking status: Former Smoker -- 0.30 packs/day for 3 years    Types: Cigarettes  . Smokeless tobacco: Current User    Types: Chew     Comment: 2003  . Alcohol Use: No   Medication:   Current Outpatient Rx  Name  Route  Sig  Dispense  Refill  .  acetaminophen (TYLENOL) 500 MG tablet   Oral   Take 1,000 mg by mouth every 6 (six) hours as needed for moderate pain.         Marland Kitchen albuterol (PROVENTIL HFA;VENTOLIN HFA) 108 (90 BASE) MCG/ACT inhaler   Inhalation   Inhale 2 puffs into the lungs every 6 (six) hours as needed.         Marland Kitchen albuterol (PROVENTIL) (5 MG/ML) 0.5% nebulizer solution   Nebulization   Take 2.5 mg by nebulization every 6 (six) hours as needed.         . clonazePAM (KLONOPIN) 1 MG tablet   Oral   Take 1 mg by mouth 2 (two) times daily as needed.         . COMBIVENT RESPIMAT 20-100 MCG/ACT AERS respimat                 Dispense as written.   Marland Kitchen EPIPEN 2-PAK 0.3 MG/0.3ML SOAJ injection                 Dispense as written.   . escitalopram (LEXAPRO) 20 MG tablet   Oral   Take 20 mg by mouth daily.         . fexofenadine (ALLEGRA) 180 MG tablet   Oral   Take 180  mg by mouth daily.         . fluticasone (FLONASE) 50 MCG/ACT nasal spray   Each Nare   Place 1 spray into both nostrils daily.         . Fluticasone Furoate-Vilanterol (BREO ELLIPTA) 200-25 MCG/INH AEPB   Inhalation   Inhale 1 puff into the lungs daily.   60 each   5   . ipratropium-albuterol (DUONEB) 0.5-2.5 (3) MG/3ML SOLN   Nebulization   Take 3 mLs by nebulization every 4 (four) hours as needed (Shortness of Breath).          . meloxicam (MOBIC) 7.5 MG tablet               . omeprazole (PRILOSEC) 20 MG capsule   Oral   Take 20 mg by mouth daily.         Marland Kitchen. EXPIRED: lisinopril (PRINIVIL,ZESTRIL) 10 MG tablet   Oral   Take 5 mg by mouth.             Allergies:  Diphenhydramine; Guaifenesin; and Amoxicillin  Review of Systems: Gen:  Denies  fever, sweats, chills HEENT: Denies blurred vision, double vision, ear pain, eye pain, hearing loss, nose bleeds, sore throat Cvc:  Palpitations and chest pain Resp:   Denies cough or sputum porduction, shortness of breath Gi: Denies swallowing difficulty,  stomach pain, nausea or vomiting, diarrhea, constipation, bowel incontinence Gu:  Denies bladder incontinence, burning urine Ext:   No Joint pain, stiffness or swelling Skin: No skin rash, easy bruising or bleeding or hives Endoc:  No polyuria, polydipsia , polyphagia or weight change Psych: No depression, insomnia or hallucinations  Other:  All other systems negative  Physical Examination:   VS: BP 128/76 mmHg  Pulse 75  Ht 5\' 8"  (1.727 m)  Wt 277 lb 6.4 oz (125.828 kg)  BMI 42.19 kg/m2  SpO2 96%  General Appearance: No distress  Neuro:without focal findings, mental status, speech normal, alert and oriented, cranial nerves 2-12 intact, reflexes normal and symmetric, sensation grossly normal  HEENT: PERRLA, EOM intact, no ptosis, no other lesions noticed Pulmonary: normal breath sounds., diaphragmatic excursion normal.No wheezing, No rales;   Sputum Production:  None CardiovascularNormal S1,S2.  No m/r/g.  Abdominal aorta pulsation normal.    Abdomen: Benign, Soft, non-tender, No masses, hepatosplenomegaly, No lymphadenopathy Renal:  No costovertebral tenderness  GU:  No performed at this time. Endoc: No evident thyromegaly, no signs of acromegaly or Cushing features Skin:   warm, no rashes, no ecchymosis  Extremities: normal, no cyanosis, clubbing, no edema, warm with normal capillary refill. Other findings: None  Testing reviewed by Dr. Dema SeverinMungal on 12/10/15 6min walk test -11/16/15-  distance 312, lowest sat 94%, comments - walk at moderate pace, complained of chest tightness at the end of the test  PFT 11/16/15 at this time by ATS criteria, is evident for:  Moderate to severe obstruction with significant bronchodilator response, no significant restrictive defects noted.   Pre-Bronch       Post-Bronch     FEV1: Actual 1.66   Predicted 42%  Actual 2.00 Pred 51%  20%Chg  FEV1/FVC: Actual 49   Predicted 61%  Actual 54 Pred 67%  10%Chg  RV: Actual 1.55   Predicted 106%     TLC: Actual  5.61   Predicted 91%     RV/TLC ([place])(%): Actual 28 Predicted 115%    DLCO2:cor: Actual 26.95  Predicted 101%    Assessment and Plan:Steven [name] is a  32 yom with severely decreased activity tolerance and disability 2/2 severe asthma, worsened by comorbid deconditioning, allergies, GERD, seen in consultation today for optimization of asthma/obstructive disease.   Asthma, chronic Patient's primary problem seems to be with controlling his asthma, and having a good understanding of asthma, its triggers, and control and use of rescue inhalers is paramount to his overall pulmonary status.   He states that he's had childhood asthma and has had difficulty controlling it, he has been [facility] and [facility], with extensive workup of multiple CAT scans, obstructive sleep apnea workup, cardiac echo. I believe that immobility, deconditioning, obesity and bad habits such as chewing tobacco and lack of exercise with poor dietary control, it is adding to his asthma issues and making it more difficult to control. 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 At his last visit  I stopped his Combivent inhaler and his Atrovent in an attempt to start weaning him off of short acting bronchodilators, but he has continue to use.  There might be an anxiety component to his beta agonist dependence, however, he did state that Virgel Bouquet is helping, he has cut down combivent nebs to 2 times per day, and albuterol inhaler to every 4 hours.  FEV1 42%, FEV1/FVC 49% 20% bronchodilator change after albuterol given during pulmonary function testing: This shows moderate to severe obstruction with significant bronchodilator  response.  Plan: -again Stop Combivent, stop Atrovent -cont with Breo 200/25, 1 puff daily gargle and rinse after each use. -Avoid any form of tobacco smoking which he may include E cigarettes, cigars, vapors, etc. -keep appointment with pulmonary rehab.  -Diet, healthy weight loss, exercise  -Stop chewing tobacco Patient was educated extensively in the use of his inhalers, rescue versus maintenance inhaler. He verbalizes understanding of both types. Patient was also educated on when to use his nebulizer, basically for intense coughing, wheezing, and shortness of breath.        Updated Medication List Outpatient Encounter Prescriptions as of 02/01/2016  Medication Sig  . acetaminophen (TYLENOL) 500 MG tablet Take 1,000 mg by mouth every 6 (six) hours as needed for moderate pain.  Marland Kitchen albuterol (PROVENTIL HFA;VENTOLIN HFA) 108 (90 BASE) MCG/ACT inhaler Inhale 2 puffs into the lungs every 6 (six) hours as needed.  Marland Kitchen albuterol (PROVENTIL) (5 MG/ML) 0.5% nebulizer solution Take 2.5 mg by nebulization every 6 (six) hours as needed.  . clonazePAM (KLONOPIN) 1 MG tablet Take 1 mg by mouth 2 (two) times daily as needed.  . COMBIVENT RESPIMAT 20-100 MCG/ACT AERS respimat   . EPIPEN 2-PAK 0.3 MG/0.3ML SOAJ injection   . escitalopram (LEXAPRO) 20 MG tablet Take 20 mg by mouth daily.  . fexofenadine (ALLEGRA) 180 MG tablet Take 180 mg by mouth daily.  . fluticasone (FLONASE) 50 MCG/ACT nasal spray Place 1 spray into both nostrils daily.  . Fluticasone Furoate-Vilanterol (BREO ELLIPTA) 200-25 MCG/INH AEPB Inhale 1 puff into the lungs daily.  Marland Kitchen ipratropium-albuterol (DUONEB) 0.5-2.5 (3) MG/3ML SOLN Take 3 mLs by nebulization every 4 (four) hours as needed (Shortness of Breath).   . meloxicam (MOBIC) 7.5 MG tablet   . omeprazole (PRILOSEC) 20 MG capsule Take 20 mg by mouth daily.  Marland Kitchen lisinopril (PRINIVIL,ZESTRIL) 10 MG tablet  Take 5 mg by mouth.   No facility-administered encounter medications on  file as of 02/01/2016.    Orders for this visit: No orders of the defined types were placed in this encounter.     Thank  you for the consultation and for allowing [facility] to assist in the care of your patient. Our recommendations are noted above.  Please contact us if we can be of further service.   Stephanie Acre, MD [facility] Office Number: [phone]

## 2016-02-01 NOTE — Patient Instructions (Signed)
Follow up with Dr. Dema SeverinMungal in:4 months -please keep follow-up appointments with pulmonary rehabilitation -Continue with use of Breo as prescribed -Please limit your rescue nebulizer and rescue inhaler to use only with severe shortness of breath/wheezing/coughing, as discussed during today's visit.  -Continue with diet and weight loss regiment

## 2016-02-09 ENCOUNTER — Encounter (HOSPITAL_COMMUNITY): Payer: Self-pay

## 2016-02-09 ENCOUNTER — Encounter (HOSPITAL_COMMUNITY)
Admission: RE | Admit: 2016-02-09 | Discharge: 2016-02-09 | Disposition: A | Discharge status: Home / Self Care | Payer: Medicare Other | Source: Ambulatory Visit | Attending: Internal Medicine | Admitting: Internal Medicine

## 2016-02-09 VITALS — BP 108/80 | HR 81 | Ht 69.0 in | Wt 275.0 lb

## 2016-02-09 DIAGNOSIS — J449 Chronic obstructive pulmonary disease, unspecified: Secondary | ICD-10-CM | POA: Diagnosis not present

## 2016-02-09 DIAGNOSIS — J4540 Moderate persistent asthma, uncomplicated: Secondary | ICD-10-CM | POA: Insufficient documentation

## 2016-02-09 DIAGNOSIS — F329 Major depressive disorder, single episode, unspecified: Secondary | ICD-10-CM | POA: Diagnosis not present

## 2016-02-09 DIAGNOSIS — Z79899 Other long term (current) drug therapy: Secondary | ICD-10-CM | POA: Diagnosis not present

## 2016-02-09 DIAGNOSIS — Z87891 Personal history of nicotine dependence: Secondary | ICD-10-CM | POA: Insufficient documentation

## 2016-02-09 DIAGNOSIS — J8410 Pulmonary fibrosis, unspecified: Secondary | ICD-10-CM | POA: Insufficient documentation

## 2016-02-09 HISTORY — DX: Depression, unspecified: F32.A

## 2016-02-09 HISTORY — DX: Major depressive disorder, single episode, unspecified: F32.9

## 2016-02-09 HISTORY — DX: Palpitations: R00.2

## 2016-02-09 HISTORY — DX: Anxiety disorder, unspecified: F41.9

## 2016-02-09 NOTE — Progress Notes (Signed)
Cardiac/Pulmonary Rehab Medication Review by a Pharmacist  Does the patient  feel that his/her medications are working for him/her?  yes  Has the patient been experiencing any side effects to the medications prescribed?  no  Does the patient measure his/her own blood pressure or blood glucose at home?  no   Does the patient have any problems obtaining medications due to transportation or finances?   no  Understanding of regimen: excellent Understanding of indications: excellent Potential of compliance: excellent  Questions asked to Determine Patient Understanding of Medication Regimen:  1. What is the name of the medication?  2. What is the medication used for?  3. When should it be taken?  4. How much should be taken?  5. How will you take it?  6. What side effects should you report?  Understanding Defined as: Excellent: All questions above are correct Good: Questions 1-4 are correct Fair: Questions 1-2 are correct  Poor: 1 or none of the above questions are correct   Pharmacist comments: Pt does not c/o any side effects.  States he has not use nebulizer in several months.  Pt has lots of environmental allergies but does not currently take allergy shots.  Medication list updated.  Valrie HartHall, Annina Piotrowski A 02/09/2016 3:09 PM

## 2016-02-09 NOTE — Progress Notes (Signed)
Pulmonary Individual Treatment Plan  Patient Details  Name: Steven [name] MRN: [id number] Date of Birth: [birth year] Referring Provider:  Stephanie Acre, MD  Initial Encounter Date:       PULMONARY REHAB OTHER RESP ORIENTATION from 02/09/2016 in [facility]   Date  02/09/16      Visit Diagnosis: Moderate persistent asthma, uncomplicated  Patient's Home Medications on Admission:   Current outpatient prescriptions:  .  fexofenadine-pseudoephedrine (ALLEGRA-D 24) 180-240 MG 24 hr tablet, Take 1 tablet by mouth daily as needed (will take when allergies get "really bad")., Disp: , Rfl:  .  acetaminophen (TYLENOL) 500 MG tablet, Take 1,000 mg by mouth every 6 (six) hours as needed for moderate pain., Disp: , Rfl:  .  albuterol (PROVENTIL HFA;VENTOLIN HFA) 108 (90 BASE) MCG/ACT inhaler, Inhale 2 puffs into the lungs every 6 (six) hours as needed., Disp: , Rfl:  .  clonazePAM (KLONOPIN) 1 MG tablet, Take 1 mg by mouth 2 (two) times daily as needed., Disp: , Rfl:  .  COMBIVENT RESPIMAT 20-100 MCG/ACT AERS respimat, Inhale 1 puff into the lungs every 6 (six) hours as needed for wheezing. , Disp: , Rfl:  .  EPIPEN 2-PAK 0.3 MG/0.3ML SOAJ injection, , Disp: , Rfl:  .  escitalopram (LEXAPRO) 20 MG tablet, Take 20 mg by mouth daily., Disp: , Rfl:  .  fexofenadine (ALLEGRA) 180 MG tablet, Take 180 mg by mouth daily., Disp: , Rfl:  .  fluticasone (FLONASE) 50 MCG/ACT nasal spray, Place 1 spray into both nostrils daily., Disp: , Rfl:  .  Fluticasone Furoate-Vilanterol (BREO ELLIPTA) 200-25 MCG/INH AEPB, Inhale 1 puff into the lungs daily., Disp: 60 each, Rfl: 5 .  lisinopril (PRINIVIL,ZESTRIL) 10 MG tablet, Take 5 mg by mouth., Disp: , Rfl:  .  omeprazole (PRILOSEC) 20 MG capsule, Take 20 mg by mouth daily., Disp: , Rfl:   Past Medical History: Past Medical History  Diagnosis Date  . Asthma   . Arthritis     knee  . Chronic mental illness   . COPD (chronic obstructive  pulmonary disease) (HCC)   . Pulmonary fibrosis (HCC)   . Depression   . Anxiety   . Heart palpitations     Tobacco Use: History  Smoking status  . Former Smoker -- 0.50 packs/day for 2 years  . Types: Cigarettes  . Quit date: 10/31/2000  Smokeless tobacco  . Current User  . Types: Chew    Comment: 2003    Labs:     Recent Review Flowsheet Data    There is no flowsheet data to display.      Capillary Blood Glucose: No results found for: GLUCAP   ADL UCSD:     Pulmonary Assessment Scores      02/09/16 1627       ADL UCSD   ADL Phase Entry     SOB Score total 57     Rest 1     Walk 2     Stairs 3     Bath 2     Dress 1     Shop 2     CAT Score   CAT Score 28     mMRC Score   mMRC Score 3        Pulmonary Function Assessment:   Exercise Target Goals: Date: 02/09/16  Exercise Program Goal: Individual exercise prescription set with THRR, safety & activity barriers. Participant demonstrates ability to understand and report RPE using BORG scale, to  self-measure pulse accurately, and to acknowledge the importance of the exercise prescription.  Exercise Prescription Goal: Starting with aerobic activity 30 plus minutes a day, 3 days per week for initial exercise prescription. Provide home exercise prescription and guidelines that participant acknowledges understanding prior to discharge.  Activity Barriers & Risk Stratification:     Activity Barriers & Cardiac Risk Stratification - 02/09/16 1547    Activity Barriers & Cardiac Risk Stratification   Activity Barriers None   Cardiac Risk Stratification Low      6 Minute Walk:     6 Minute Walk      02/10/16 1333       6 Minute Walk   Phase Initial     Distance 1200 feet     Walk Time 6 minutes     # of Rest Breaks 0     MPH 2.27     METS 2.74     RPE 11     Perceived Dyspnea  13     VO2 Peak 14.8     Symptoms Yes (comment)     Comments Patient c/o of chest discomfort 2 out of 10 and leg  pain 6 out 10 on pain scale.      Resting HR 81 bpm     Resting BP 108/80 mmHg     Max Ex. HR 107 bpm     Max Ex. BP 148/84 mmHg     2 Minute Post BP 132/78 mmHg        Initial Exercise Prescription:     Initial Exercise Prescription - 02/09/16 1445    Date of Initial Exercise Prescription   Date 02/09/16   Treadmill   MPH 1.5   Grade 0   Minutes 15   METs 2.15   NuStep   Level 2   Minutes 15   METs 1.5   Prescription Details   Frequency (times per week) 3   Duration Progress to 30 minutes of continuous aerobic without signs/symptoms of physical distress   Intensity   THRR REST +  20   THRR 40-80% of Max Heartrate 124-145   Ratings of Perceived Exertion 11-13   Perceived Dyspnea 0-4   Progression   Progression Continue to progress workloads to maintain intensity without signs/symptoms of physical distress.   Resistance Training   Training Prescription Yes   Weight 1.0   Reps 10-12      Perform Capillary Blood Glucose checks as needed.  Exercise Prescription Changes:   Exercise Comments:   Discharge Exercise Prescription (Final Exercise Prescription Changes):   Nutrition:  Target Goals: Understanding of nutrition guidelines, daily intake of sodium 1500mg , cholesterol 200mg , calories 30% from fat and 7% or less from saturated fats, daily to have 5 or more servings of fruits and vegetables.  Biometrics:     Pre Biometrics - 02/10/16 1350    Pre Biometrics   Waist Circumference 46 inches   Hip Circumference 43 inches   Waist to Hip Ratio 1.07 %   Triceps Skinfold 20 mm   % Body Fat 36.4 %   Grip Strength 110 kg   Flexibility 16 in   Single Leg Stand 60 seconds       Nutrition Therapy Plan and Nutrition Goals:     Nutrition Therapy & Goals - 02/09/16 1625    Personal Nutrition Goals   Personal Goal #1 increase dairy   Personal Goal #2 use fruit popsicles or sherbet instead of ice cream  Nutrition Discharge: Rate Your Plate Scores:      Nutrition Assessments - 02/09/16 1548    Rate Your Plate Scores   Pre Score 45      Psychosocial: Target Goals: Acknowledge presence or absence of depression, maximize coping skills, provide positive support system. Participant is able to verbalize types and ability to use techniques and skills needed for reducing stress and depression.  Initial Review & Psychosocial Screening:     Initial Psych Review & Screening - 02/09/16 1616    Initial Review   Current issues with Current Depression;Current Anxiety/Panic   Family Dynamics   Good Support System? Yes   Concerns --  none   Barriers   Psychosocial barriers to participate in program The patient should benefit from training in stress management and relaxation.   Screening Interventions   Interventions Encouraged to exercise      Quality of Life Scores:     Quality of Life - 02/10/16 1352    Quality of Life Scores   Health/Function Pre 20.69 %   Socioeconomic Pre 20 %   Psych/Spiritual Pre 21 %   Family Pre 33 %   GLOBAL Pre 22.44 %      PHQ-9:     Recent Review Flowsheet Data    Depression screen [facility] 2/9 02/09/2016   Decreased Interest 2   Down, Depressed, Hopeless 3   PHQ - 2 Score 5   Altered sleeping 3   Tired, decreased energy 3   Change in appetite 3   Feeling bad or failure about yourself  3   Trouble concentrating 2   Moving slowly or fidgety/restless 3   Suicidal thoughts 1   PHQ-9 Score 23   Difficult doing work/chores Extremely dIfficult      Psychosocial Evaluation and Intervention:     Psychosocial Evaluation - 02/09/16 1618    Psychosocial Evaluation & Interventions   Interventions Stress management education;Relaxation education;Encouraged to exercise with the program and follow exercise prescription   Continued Psychosocial Services Needed No   Discharge Psychosocial Assessment & Intervention   Discharge Continue support measures as needed      Psychosocial  Re-Evaluation:   Education: Education Goals: Education classes will be provided on a weekly basis, covering required topics. Participant will state understanding/return demonstration of topics presented.  Learning Barriers/Preferences:     Learning Barriers/Preferences - 02/09/16 1547    Learning Barriers/Preferences   Learning Barriers None   Learning Preferences Skilled Demonstration      Education Topics: How Lungs Work and Diseases: - Discuss the anatomy of the lungs and diseases that can affect the lungs, such as COPD.   Exercise: -Discuss the importance of exercise, FITT principles of exercise, normal and abnormal responses to exercise, and how to exercise safely.   Environmental Irritants: -Discuss types of environmental irritants and how to limit exposure to environmental irritants.   Meds/Inhalers and oxygen: - Discuss respiratory medications, definition of an inhaler and oxygen, and the proper way to use an inhaler and oxygen.   Energy Saving Techniques: - Discuss methods to conserve energy and decrease shortness of breath when performing activities of daily living.    Bronchial Hygiene / Breathing Techniques: - Discuss breathing mechanics, pursed-lip breathing technique,  proper posture, effective ways to clear airways, and other functional breathing techniques   Cleaning Equipment: - Provides group verbal and written instruction about the health risks of elevated stress, cause of high stress, and healthy ways to reduce stress.   Nutrition I: Fats: -  Discuss the types of cholesterol, what cholesterol does to the body, and how cholesterol levels can be controlled.   Nutrition II: Labels: -Discuss the different components of food labels and how to read food labels.   Respiratory Infections: - Discuss the signs and symptoms of respiratory infections, ways to prevent respiratory infections, and the importance of seeking medical treatment when having a  respiratory infection.   Stress I: Signs and Symptoms: - Discuss the causes of stress, how stress may lead to anxiety and depression, and ways to limit stress.   Stress II: Relaxation: -Discuss relaxation techniques to limit stress.   Oxygen for Home/Travel: - Discuss how to prepare for travel when on oxygen and proper ways to transport and store oxygen to ensure safety.   Knowledge Questionnaire Score:     Knowledge Questionnaire Score - 02/09/16 1547    Knowledge Questionnaire Score   Pre Score 10/14      Core Components/Risk Factors/Patient Goals at Admission:     Personal Goals and Risk Factors at Admission - 02/09/16 1548    Core Components/Risk Factors/Patient Goals on Admission    Weight Management Yes   Intervention Weight Management: Provide education and appropriate resources to help participant work on and attain dietary goals.;Weight Management/Obesity: Establish reasonable short term and long term weight goals.   Expected Outcomes Short Term: Continue to assess and modify interventions until short term weight is achieved   Sedentary Yes   Intervention Provide advice, education, support and counseling about physical activity/exercise needs.;Develop an individualized exercise prescription for aerobic and resistive training based on initial evaluation findings, risk stratification, comorbidities and participant's personal goals.   Expected Outcomes Achievement of increased cardiorespiratory fitness and enhanced flexibility, muscular endurance and strength shown through measurements of functional capacity and personal statement of participant.   Tobacco Cessation Yes   Intervention Education officer, environmental, assist with locating and accessing local/national Quit Smoking programs, and support quit date choice.   Expected Outcomes Short Term: Will demonstrate readiness to quit, by selecting a quit date.   Improve shortness of breath with ADL's Yes   Intervention  Provide education, individualized exercise plan and daily activity instruction to help decrease symptoms of SOB with activities of daily living.   Expected Outcomes Short Term: Achieves a reduction of symptoms when performing activities of daily living.   Develop more efficient breathing techniques such as purse lipped breathing and diaphragmatic breathing; and practicing self-pacing with activity Yes   Intervention Provide education, demonstration and support about specific breathing techniuqes utilized for more efficient breathing. Include techniques such as pursed lipped breathing, diaphragmatic breathing and self-pacing activity.   Expected Outcomes Short Term: Participant will be able to demonstrate and use breathing techniques as needed throughout daily activities.   Stress Yes   Intervention Offer individual and/or small group education and counseling on adjustment to heart disease, stress management and health-related lifestyle change. Teach and support self-help strategies.   Expected Outcomes Long Term: Emotional wellbeing is indicated by absence of clinically significant psychosocial distress or social isolation.;Short Term: Participant demonstrates changes in health-related behavior, relaxation and other stress management skills, ability to obtain effective social support, and compliance with psychotropic medications if prescribed.      Core Components/Risk Factors/Patient Goals Review:      Goals and Risk Factor Review      02/09/16 1552           Core Components/Risk Factors/Patient Goals Review   Personal Goals Review Weight Management/Obesity;Sedentary;Tobacco Cessation;Improve shortness of breath  with ADL's;Develop more efficient breathing techniques such as purse lipped breathing and diaphragmatic breathing and practicing self-pacing with activity.;Stress          Core Components/Risk Factors/Patient Goals at Discharge (Final Review):      Goals and Risk Factor Review -  02/09/16 1552    Core Components/Risk Factors/Patient Goals Review   Personal Goals Review Weight Management/Obesity;Sedentary;Tobacco Cessation;Improve shortness of breath with ADL's;Develop more efficient breathing techniques such as purse lipped breathing and diaphragmatic breathing and practicing self-pacing with activity.;Stress      ITP Comments:     ITP Comments      02/09/16 1629           ITP Comments Patient is a 35 year old male who lives with wife and kids.  Patient states he has strong history of anxiety and depression and already see counselor for this.  patient very personable and did not show any S/S of anxiety or depression at this time. Will continue to monitor.           Comments:

## 2016-02-09 NOTE — Patient Instructions (Addendum)
Patient arrived for 1st visit/orientation/education at 1445. Patient was referred to CR by Dr. Dema SeverinMungal due to Asthma (J45.40). During orientation advised patient on arrival and appointment times what to wear, what to do before, during and after exercise. Reviewed attendance and class policy. Talked about inclement weather and class consultation policy. Pt is scheduled to return Cardiac Rehab on 02/16/16 at 1330. Pt was advised to come to class 15 minutes before class starts. He was also given instructions on meeting with the dietician and attending the Family Structure classes. Pt entrance PHQ score is 23. Patient states he already sees a Veterinary surgeoncounselor.  Patient was able to complete 6 minute walk test. Patient was measured for the equipment. Discussed equipment safety with patient. Took patient pre-anthropometric measurements. Patient finished visit at 1620.  Pt has been told to take their medications 1 hour prior to coming to class.  If the patient is not going to attend class he has been instructed to call.

## 2016-02-16 ENCOUNTER — Encounter (HOSPITAL_COMMUNITY)
Admission: RE | Admit: 2016-02-16 | Discharge: 2016-02-16 | Disposition: A | Discharge status: Home / Self Care | Payer: Medicare Other | Source: Ambulatory Visit

## 2016-02-16 DIAGNOSIS — J4540 Moderate persistent asthma, uncomplicated: Secondary | ICD-10-CM | POA: Diagnosis not present

## 2016-02-18 ENCOUNTER — Encounter (HOSPITAL_COMMUNITY)
Admission: RE | Admit: 2016-02-18 | Discharge: 2016-02-18 | Disposition: A | Discharge status: Home / Self Care | Payer: Medicare Other | Source: Ambulatory Visit | Attending: Internal Medicine | Admitting: Internal Medicine

## 2016-02-18 DIAGNOSIS — J4540 Moderate persistent asthma, uncomplicated: Secondary | ICD-10-CM | POA: Diagnosis not present

## 2016-02-23 ENCOUNTER — Encounter (HOSPITAL_COMMUNITY): Payer: Medicare Other

## 2016-02-25 ENCOUNTER — Encounter (HOSPITAL_COMMUNITY)
Admission: RE | Admit: 2016-02-25 | Discharge: 2016-02-25 | Disposition: A | Discharge status: Home / Self Care | Payer: Medicare Other | Source: Ambulatory Visit | Attending: Internal Medicine | Admitting: Internal Medicine

## 2016-02-25 DIAGNOSIS — J4540 Moderate persistent asthma, uncomplicated: Secondary | ICD-10-CM | POA: Diagnosis not present

## 2016-02-27 NOTE — Progress Notes (Signed)
Pulmonary Individual Treatment Plan  Patient Details  Name: Steven [name] MRN: [id number] Date of Birth: 10-Feb-1981 Referring Provider:    Initial Encounter Date:       PULMONARY REHAB OTHER RESP ORIENTATION from 02/09/2016 in [facility]   Date  02/09/16      Visit Diagnosis: No diagnosis found.  Patient's Home Medications on Admission:   Current outpatient prescriptions:  .  acetaminophen (TYLENOL) 500 MG tablet, Take 1,000 mg by mouth every 6 (six) hours as needed for moderate pain., Disp: , Rfl:  .  albuterol (PROVENTIL HFA;VENTOLIN HFA) 108 (90 BASE) MCG/ACT inhaler, Inhale 2 puffs into the lungs every 6 (six) hours as needed., Disp: , Rfl:  .  clonazePAM (KLONOPIN) 1 MG tablet, Take 1 mg by mouth 2 (two) times daily as needed., Disp: , Rfl:  .  COMBIVENT RESPIMAT 20-100 MCG/ACT AERS respimat, Inhale 1 puff into the lungs every 6 (six) hours as needed for wheezing. , Disp: , Rfl:  .  EPIPEN 2-PAK 0.3 MG/0.3ML SOAJ injection, , Disp: , Rfl:  .  escitalopram (LEXAPRO) 20 MG tablet, Take 20 mg by mouth daily., Disp: , Rfl:  .  fexofenadine (ALLEGRA) 180 MG tablet, Take 180 mg by mouth daily., Disp: , Rfl:  .  fexofenadine-pseudoephedrine (ALLEGRA-D 24) 180-240 MG 24 hr tablet, Take 1 tablet by mouth daily as needed (will take when allergies get "really bad")., Disp: , Rfl:  .  fluticasone (FLONASE) 50 MCG/ACT nasal spray, Place 1 spray into both nostrils daily., Disp: , Rfl:  .  Fluticasone Furoate-Vilanterol (BREO ELLIPTA) 200-25 MCG/INH AEPB, Inhale 1 puff into the lungs daily., Disp: 60 each, Rfl: 5 .  lisinopril (PRINIVIL,ZESTRIL) 10 MG tablet, Take 5 mg by mouth., Disp: , Rfl:  .  omeprazole (PRILOSEC) 20 MG capsule, Take 20 mg by mouth daily., Disp: , Rfl:   Past Medical History: Past Medical History  Diagnosis Date  . Asthma   . Arthritis     knee  . Chronic mental illness   . COPD (chronic obstructive pulmonary disease) (HCC)   . Pulmonary  fibrosis (HCC)   . Depression   . Anxiety   . Heart palpitations     Tobacco Use: History  Smoking status  . Former Smoker -- 0.50 packs/day for 2 years  . Types: Cigarettes  . Quit date: 10/31/2000  Smokeless tobacco  . Current User  . Types: Chew    Comment: 2003    Labs:     Recent Review Flowsheet Data    There is no flowsheet data to display.      Capillary Blood Glucose: No results found for: GLUCAP   ADL UCSD:     Pulmonary Assessment Scores      02/09/16 1627       ADL UCSD   ADL Phase Entry     SOB Score total 57     Rest 1     Walk 2     Stairs 3     Bath 2     Dress 1     Shop 2     CAT Score   CAT Score 28     mMRC Score   mMRC Score 3        Pulmonary Function Assessment:   Exercise Target Goals:    Exercise Program Goal: Individual exercise prescription set with THRR, safety & activity barriers. Participant demonstrates ability to understand and report RPE using BORG scale, to self-measure pulse accurately,  and to acknowledge the importance of the exercise prescription.  Exercise Prescription Goal: Starting with aerobic activity 30 plus minutes a day, 3 days per week for initial exercise prescription. Provide home exercise prescription and guidelines that participant acknowledges understanding prior to discharge.  Activity Barriers & Risk Stratification:     Activity Barriers & Cardiac Risk Stratification - 02/09/16 1547    Activity Barriers & Cardiac Risk Stratification   Activity Barriers None   Cardiac Risk Stratification Low      6 Minute Walk:     6 Minute Walk      02/10/16 1333       6 Minute Walk   Phase Initial     Distance 1200 feet     Walk Time 6 minutes     # of Rest Breaks 0     MPH 2.27     METS 2.74     RPE 11     Perceived Dyspnea  13     VO2 Peak 14.8     Symptoms Yes (comment)     Comments Patient c/o of chest discomfort 2 out of 10 and leg pain 6 out 10 on pain scale.      Resting HR 81 bpm      Resting BP 108/80 mmHg     Max Ex. HR 107 bpm     Max Ex. BP 148/84 mmHg     2 Minute Post BP 132/78 mmHg        Initial Exercise Prescription:     Initial Exercise Prescription - 02/09/16 1445    Date of Initial Exercise RX and Referring Provider   Date 02/09/16   Treadmill   MPH 1.5   Grade 0   Minutes 15   METs 2.15   NuStep   Level 2   Minutes 15   METs 1.5   Prescription Details   Frequency (times per week) 3   Duration Progress to 30 minutes of continuous aerobic without signs/symptoms of physical distress   Intensity   THRR REST +  20   THRR 40-80% of Max Heartrate 124-145   Ratings of Perceived Exertion 11-13   Perceived Dyspnea 0-4   Progression   Progression Continue to progress workloads to maintain intensity without signs/symptoms of physical distress.   Resistance Training   Training Prescription Yes   Weight 1.0   Reps 10-12      Perform Capillary Blood Glucose checks as needed.  Exercise Prescription Changes:      Exercise Prescription Changes      02/27/16 2000           Exercise Review   Progression Yes       Response to Exercise   Blood Pressure (Admit) 108/70 mmHg       Blood Pressure (Exercise) 142/70 mmHg       Blood Pressure (Exit) 108/50 mmHg       Heart Rate (Admit) 87 bpm       Heart Rate (Exercise) 105 bpm       Heart Rate (Exit) 95 bpm       Rating of Perceived Exertion (Exercise) 9       Perceived Dyspnea (Exercise) 10       Symptoms None       Duration Progress to 30 minutes of continuous aerobic without signs/symptoms of physical distress       Intensity Rest + 20       Progression   Progression Continue to progress  workloads to maintain intensity without signs/symptoms of physical distress.       Resistance Training   Training Prescription Yes       Weight 1.0       Reps 10-12       Interval Training   Interval Training No       Treadmill   MPH 2       Grade 0       Minutes 15       METs 2.5       NuStep    Level 2       Minutes 15       METs 3.76       Home Exercise Plan   Plans to continue exercise at Home       Frequency Add 2 additional days to program exercise sessions.          Exercise Comments:      Exercise Comments      02/27/16 2032           Exercise Comments Patient is progressing appropriately.           Discharge Exercise Prescription (Final Exercise Prescription Changes):     Exercise Prescription Changes - 02/27/16 2000    Exercise Review   Progression Yes   Response to Exercise   Blood Pressure (Admit) 108/70 mmHg   Blood Pressure (Exercise) 142/70 mmHg   Blood Pressure (Exit) 108/50 mmHg   Heart Rate (Admit) 87 bpm   Heart Rate (Exercise) 105 bpm   Heart Rate (Exit) 95 bpm   Rating of Perceived Exertion (Exercise) 9   Perceived Dyspnea (Exercise) 10   Symptoms None   Duration Progress to 30 minutes of continuous aerobic without signs/symptoms of physical distress   Intensity Rest + 20   Progression   Progression Continue to progress workloads to maintain intensity without signs/symptoms of physical distress.   Resistance Training   Training Prescription Yes   Weight 1.0   Reps 10-12   Interval Training   Interval Training No   Treadmill   MPH 2   Grade 0   Minutes 15   METs 2.5   NuStep   Level 2   Minutes 15   METs 3.76   Home Exercise Plan   Plans to continue exercise at Home   Frequency Add 2 additional days to program exercise sessions.      Nutrition:  Target Goals: Understanding of nutrition guidelines, daily intake of sodium 1500mg , cholesterol 200mg , calories 30% from fat and 7% or less from saturated fats, daily to have 5 or more servings of fruits and vegetables.  Biometrics:     Pre Biometrics - 02/10/16 1350    Pre Biometrics   Waist Circumference 46 inches   Hip Circumference 43 inches   Waist to Hip Ratio 1.07 %   Triceps Skinfold 20 mm   % Body Fat 36.4 %   Grip Strength 110 kg   Flexibility 16 in    Single Leg Stand 60 seconds       Nutrition Therapy Plan and Nutrition Goals:     Nutrition Therapy & Goals - 02/09/16 1625    Personal Nutrition Goals   Personal Goal #1 increase dairy   Personal Goal #2 use fruit popsicles or sherbet instead of ice cream      Nutrition Discharge: Rate Your Plate Scores:     Nutrition Assessments - 02/09/16 1548    Rate Your Plate Scores  Pre Score 45      Psychosocial: Target Goals: Acknowledge presence or absence of depression, maximize coping skills, provide positive support system. Participant is able to verbalize types and ability to use techniques and skills needed for reducing stress and depression.  Initial Review & Psychosocial Screening:     Initial Psych Review & Screening - 02/09/16 1616    Initial Review   Current issues with Current Depression;Current Anxiety/Panic   Family Dynamics   Good Support System? Yes   Concerns --  none   Barriers   Psychosocial barriers to participate in program The patient should benefit from training in stress management and relaxation.   Screening Interventions   Interventions Encouraged to exercise      Quality of Life Scores:     Quality of Life - 02/10/16 1352    Quality of Life Scores   Health/Function Pre 20.69 %   Socioeconomic Pre 20 %   Psych/Spiritual Pre 21 %   Family Pre 33 %   GLOBAL Pre 22.44 %      PHQ-9:     Recent Review Flowsheet Data    Depression screen [facility] 2/9 02/09/2016   Decreased Interest 2   Down, Depressed, Hopeless 3   PHQ - 2 Score 5   Altered sleeping 3   Tired, decreased energy 3   Change in appetite 3   Feeling bad or failure about yourself  3   Trouble concentrating 2   Moving slowly or fidgety/restless 3   Suicidal thoughts 1   PHQ-9 Score 23   Difficult doing work/chores Extremely dIfficult      Psychosocial Evaluation and Intervention:     Psychosocial Evaluation - 02/09/16 1618    Psychosocial Evaluation & Interventions    Interventions Stress management education;Relaxation education;Encouraged to exercise with the program and follow exercise prescription   Continued Psychosocial Services Needed No   Discharge Psychosocial Assessment & Intervention   Discharge Continue support measures as needed      Psychosocial Re-Evaluation:     Psychosocial Re-Evaluation      02/26/16 1445           Psychosocial Re-Evaluation   Interventions Relaxation education;Encouraged to attend Pulmonary Rehabilitation for the exercise;Stress management education       Continued Psychosocial Services Needed No          Education: Education Goals: Education classes will be provided on a weekly basis, covering required topics. Participant will state understanding/return demonstration of topics presented.  Learning Barriers/Preferences:     Learning Barriers/Preferences - 02/09/16 1547    Learning Barriers/Preferences   Learning Barriers None   Learning Preferences Skilled Demonstration      Education Topics: How Lungs Work and Diseases: - Discuss the anatomy of the lungs and diseases that can affect the lungs, such as COPD.   Exercise: -Discuss the importance of exercise, FITT principles of exercise, normal and abnormal responses to exercise, and how to exercise safely.   Environmental Irritants: -Discuss types of environmental irritants and how to limit exposure to environmental irritants.   Meds/Inhalers and oxygen: - Discuss respiratory medications, definition of an inhaler and oxygen, and the proper way to use an inhaler and oxygen.   Energy Saving Techniques: - Discuss methods to conserve energy and decrease shortness of breath when performing activities of daily living.    Bronchial Hygiene / Breathing Techniques: - Discuss breathing mechanics, pursed-lip breathing technique,  proper posture, effective ways to clear airways, and other functional breathing techniques  Cleaning Equipment: -  Provides group verbal and written instruction about the health risks of elevated stress, cause of high stress, and healthy ways to reduce stress.   Nutrition I: Fats: - Discuss the types of cholesterol, what cholesterol does to the body, and how cholesterol levels can be controlled.   Nutrition II: Labels: -Discuss the different components of food labels and how to read food labels.   Respiratory Infections: - Discuss the signs and symptoms of respiratory infections, ways to prevent respiratory infections, and the importance of seeking medical treatment when having a respiratory infection.   Stress I: Signs and Symptoms: - Discuss the causes of stress, how stress may lead to anxiety and depression, and ways to limit stress.   Stress II: Relaxation: -Discuss relaxation techniques to limit stress.      PULMONARY REHAB OTHER RESPIRATORY from 02/25/2016 in [place] [facility]   Date  02/18/16   Educator  Hart Rochester   Instruction Review Code  2- meets goals/outcomes      Oxygen for Home/Travel: - Discuss how to prepare for travel when on oxygen and proper ways to transport and store oxygen to ensure safety.          PULMONARY REHAB OTHER RESPIRATORY from 02/25/2016 in [place] [facility]   Date  02/25/16   Educator  Hart Rochester   Instruction Review Code  2- meets goals/outcomes      Knowledge Questionnaire Score:     Knowledge Questionnaire Score - 02/09/16 1547    Knowledge Questionnaire Score   Pre Score 10/14      Core Components/Risk Factors/Patient Goals at Admission:     Personal Goals and Risk Factors at Admission - 02/09/16 1548    Core Components/Risk Factors/Patient Goals on Admission    Weight Management Yes   Intervention Weight Management: Provide education and appropriate resources to help participant work on and attain dietary goals.;Weight Management/Obesity: Establish reasonable short term and long term weight goals.    Expected Outcomes Short Term: Continue to assess and modify interventions until short term weight is achieved   Sedentary Yes   Intervention Provide advice, education, support and counseling about physical activity/exercise needs.;Develop an individualized exercise prescription for aerobic and resistive training based on initial evaluation findings, risk stratification, comorbidities and participant's personal goals.   Expected Outcomes Achievement of increased cardiorespiratory fitness and enhanced flexibility, muscular endurance and strength shown through measurements of functional capacity and personal statement of participant.   Tobacco Cessation Yes   Intervention Education officer, environmental, assist with locating and accessing local/national Quit Smoking programs, and support quit date choice.   Expected Outcomes Short Term: Will demonstrate readiness to quit, by selecting a quit date.   Improve shortness of breath with ADL's Yes   Intervention Provide education, individualized exercise plan and daily activity instruction to help decrease symptoms of SOB with activities of daily living.   Expected Outcomes Short Term: Achieves a reduction of symptoms when performing activities of daily living.   Develop more efficient breathing techniques such as purse lipped breathing and diaphragmatic breathing; and practicing self-pacing with activity Yes   Intervention Provide education, demonstration and support about specific breathing techniuqes utilized for more efficient breathing. Include techniques such as pursed lipped breathing, diaphragmatic breathing and self-pacing activity.   Expected Outcomes Short Term: Participant will be able to demonstrate and use breathing techniques as needed throughout daily activities.   Stress Yes   Intervention Offer individual and/or small group education and counseling on  adjustment to heart disease, stress management and health-related lifestyle change. Teach and  support self-help strategies.   Expected Outcomes Long Term: Emotional wellbeing is indicated by absence of clinically significant psychosocial distress or social isolation.;Short Term: Participant demonstrates changes in health-related behavior, relaxation and other stress management skills, ability to obtain effective social support, and compliance with psychotropic medications if prescribed.      Core Components/Risk Factors/Patient Goals Review:      Goals and Risk Factor Review      02/09/16 1552 02/26/16 1444         Core Components/Risk Factors/Patient Goals Review   Personal Goals Review Weight Management/Obesity;Sedentary;Tobacco Cessation;Improve shortness of breath with ADL's;Develop more efficient breathing techniques such as purse lipped breathing and diaphragmatic breathing and practicing self-pacing with activity.;Stress Weight Management/Obesity;Sedentary;Tobacco Cessation;Improve shortness of breath with ADL's;Develop more efficient breathing techniques such as purse lipped breathing and diaphragmatic breathing and practicing self-pacing with activity.;Stress      Review  Patient still smoking. has only attended 2 sessions so far in program.       Expected Outcomes  lose weight, decrease SOB and stress, Stop or slow down smoking         Core Components/Risk Factors/Patient Goals at Discharge (Final Review):      Goals and Risk Factor Review - 02/26/16 1444    Core Components/Risk Factors/Patient Goals Review   Personal Goals Review Weight Management/Obesity;Sedentary;Tobacco Cessation;Improve shortness of breath with ADL's;Develop more efficient breathing techniques such as purse lipped breathing and diaphragmatic breathing and practicing self-pacing with activity.;Stress   Review Patient still smoking. has only attended 2 sessions so far in program.    Expected Outcomes lose weight, decrease SOB and stress, Stop or slow down smoking      ITP Comments:     ITP  Comments      02/09/16 1629           ITP Comments Patient is a 35 year old male who lives with wife and kids.  Patient states he has strong history of anxiety and depression and already see counselor for this.  patient very personable and did not show any S/S of anxiety or depression at this time. Will continue to monitor.           Comments: ITP REVIEW Pt is making expected progress toward personal goals after completing 4 sessions.   Recommend continued exercise, life style modification, education, and utilization of breathing techniques to increase stamina and strength and decrease shortness of breath with exertion.

## 2016-03-01 ENCOUNTER — Encounter (HOSPITAL_COMMUNITY)
Admission: RE | Admit: 2016-03-01 | Discharge: 2016-03-01 | Disposition: A | Discharge status: Home / Self Care | Payer: Medicare Other | Source: Ambulatory Visit | Attending: Internal Medicine | Admitting: Internal Medicine

## 2016-03-01 DIAGNOSIS — J8410 Pulmonary fibrosis, unspecified: Secondary | ICD-10-CM | POA: Diagnosis not present

## 2016-03-01 DIAGNOSIS — J4540 Moderate persistent asthma, uncomplicated: Secondary | ICD-10-CM | POA: Insufficient documentation

## 2016-03-01 DIAGNOSIS — Z87891 Personal history of nicotine dependence: Secondary | ICD-10-CM | POA: Insufficient documentation

## 2016-03-01 DIAGNOSIS — Z79899 Other long term (current) drug therapy: Secondary | ICD-10-CM | POA: Diagnosis not present

## 2016-03-01 DIAGNOSIS — J449 Chronic obstructive pulmonary disease, unspecified: Secondary | ICD-10-CM | POA: Insufficient documentation

## 2016-03-01 DIAGNOSIS — F329 Major depressive disorder, single episode, unspecified: Secondary | ICD-10-CM | POA: Insufficient documentation

## 2016-03-03 ENCOUNTER — Encounter (HOSPITAL_COMMUNITY): Payer: Medicare Other

## 2016-03-08 ENCOUNTER — Encounter (HOSPITAL_COMMUNITY)
Admission: RE | Admit: 2016-03-08 | Discharge: 2016-03-08 | Disposition: A | Discharge status: Home / Self Care | Payer: Medicare Other | Source: Ambulatory Visit | Attending: Internal Medicine | Admitting: Internal Medicine

## 2016-03-08 DIAGNOSIS — J4540 Moderate persistent asthma, uncomplicated: Secondary | ICD-10-CM | POA: Diagnosis not present

## 2016-03-10 ENCOUNTER — Encounter (HOSPITAL_COMMUNITY)
Admission: RE | Admit: 2016-03-10 | Discharge: 2016-03-10 | Disposition: A | Discharge status: Home / Self Care | Payer: Medicare Other | Source: Ambulatory Visit | Attending: Internal Medicine | Admitting: Internal Medicine

## 2016-03-10 DIAGNOSIS — J4540 Moderate persistent asthma, uncomplicated: Secondary | ICD-10-CM | POA: Diagnosis not present

## 2016-03-15 ENCOUNTER — Encounter (HOSPITAL_COMMUNITY): Payer: Medicare Other

## 2016-03-17 ENCOUNTER — Encounter (HOSPITAL_COMMUNITY)
Admission: RE | Admit: 2016-03-17 | Discharge: 2016-03-17 | Disposition: A | Discharge status: Home / Self Care | Payer: Medicare Other | Source: Ambulatory Visit | Attending: Internal Medicine | Admitting: Internal Medicine

## 2016-03-17 DIAGNOSIS — J4540 Moderate persistent asthma, uncomplicated: Secondary | ICD-10-CM | POA: Diagnosis not present

## 2016-03-22 ENCOUNTER — Encounter (HOSPITAL_COMMUNITY)
Admission: RE | Admit: 2016-03-22 | Discharge: 2016-03-22 | Disposition: A | Discharge status: Home / Self Care | Payer: Medicare Other | Source: Ambulatory Visit | Attending: Internal Medicine | Admitting: Internal Medicine

## 2016-03-22 DIAGNOSIS — J4540 Moderate persistent asthma, uncomplicated: Secondary | ICD-10-CM | POA: Diagnosis not present

## 2016-03-22 NOTE — Progress Notes (Signed)
Daily Session Note  Patient Details  Name: Steven [name] MRN: [id number] Date of Birth: [birth year] Referring Provider:    Encounter Date: 03/22/2016  Check In:     Session Check In - 03/22/16 1330    Check-In   Location [facility]   Staff Present Russella Dar, MS, EP, [facility], Exercise Physiologist   Supervising physician immediately available to respond to emergencies See telemetry face sheet for immediately available MD   Medication changes reported     No   Fall or balance concerns reported    No   Warm-up and Cool-down Performed as group-led instruction   Resistance Training Performed Yes   VAD Patient? No   Pain Assessment   Currently in Pain? Yes   Pain Score 6    Pain Location Chest   Pain Orientation Anterior   Pain Descriptors / Indicators Tightness   Pain Type Acute pain   Pain Radiating Towards Not radiating   Pain Onset Today   Pain Frequency Occasional   Aggravating Factors  Patient stated he did not feel good when he arrived to Cardiac Rehab. He was not experiencing chest pain upon arrival. It stated after getting on Treadmill.    Pain Relieving Factors Stopping his exercise on the treadmill and getting him to rest for 10 minutes. Pain was relieved before checking out of CR rehab. BP was WNL.    Effect of Pain on Daily Activities N/A   Multiple Pain Sites No      Capillary Blood Glucose: No results found for this or any previous visit (from the past 24 hour(s)).   Goals Met:  Proper associated with RPD/PD & O2 Sat Independence with exercise equipment  Goals Unmet:  RPE HR O2 Sat  Comments: Patient had to stop exercising due to chest tightness, 6/10 on pain scale. Patient's pain was relieved after resting for 10 minutes. Pain was gone before leaving CR with a BP of 118/80. Share with patient that if pain returned to go to the ED.    Dr. Kate Sable is Medical Director for [facility].

## 2016-03-24 ENCOUNTER — Encounter (HOSPITAL_COMMUNITY): Payer: Medicare Other

## 2016-03-27 DIAGNOSIS — M1990 Unspecified osteoarthritis, unspecified site: Secondary | ICD-10-CM | POA: Diagnosis not present

## 2016-03-27 DIAGNOSIS — Z87891 Personal history of nicotine dependence: Secondary | ICD-10-CM | POA: Diagnosis not present

## 2016-03-27 DIAGNOSIS — J449 Chronic obstructive pulmonary disease, unspecified: Secondary | ICD-10-CM | POA: Diagnosis not present

## 2016-03-27 DIAGNOSIS — F329 Major depressive disorder, single episode, unspecified: Secondary | ICD-10-CM | POA: Diagnosis not present

## 2016-03-27 DIAGNOSIS — R0981 Nasal congestion: Secondary | ICD-10-CM | POA: Diagnosis present

## 2016-03-27 DIAGNOSIS — J45909 Unspecified asthma, uncomplicated: Secondary | ICD-10-CM | POA: Diagnosis not present

## 2016-03-28 ENCOUNTER — Encounter (HOSPITAL_COMMUNITY): Payer: Self-pay | Admitting: *Deleted

## 2016-03-28 ENCOUNTER — Emergency Department (HOSPITAL_COMMUNITY)
Admission: EM | Admit: 2016-03-28 | Discharge: 2016-03-28 | Disposition: A | Discharge status: Home / Self Care | Payer: Medicare Other | Attending: Emergency Medicine | Admitting: Emergency Medicine

## 2016-03-28 ENCOUNTER — Emergency Department (HOSPITAL_COMMUNITY): Payer: Medicare Other

## 2016-03-28 DIAGNOSIS — J449 Chronic obstructive pulmonary disease, unspecified: Secondary | ICD-10-CM

## 2016-03-28 DIAGNOSIS — W57XXXA Bitten or stung by nonvenomous insect and other nonvenomous arthropods, initial encounter: Secondary | ICD-10-CM

## 2016-03-28 MED ORDER — DOXYCYCLINE HYCLATE 100 MG PO TABS
200.0000 mg | ORAL_TABLET | Freq: Once | ORAL | Status: AC
  Administered 2016-03-28: 200 mg via ORAL
  Filled 2016-03-28: qty 2

## 2016-03-28 MED ORDER — PREDNISONE 10 MG PO TABS: 60.0000 mg | ORAL_TABLET | Freq: Every day | ORAL | Status: DC

## 2016-03-28 NOTE — ED Provider Notes (Signed)
CSN: [id number]     Arrival date & time 03/27/16  2355 History  By signing my name below, I, Steven [name], attest that this documentation has been prepared under the direction and in the presence of Derwood KaplanAnkit Lakeidra Reliford, MD. Electronically Signed: Angelene GiovanniEmmanuella [name], ED Scribe. 03/28/2016. 1:52 AM.    Chief Complaint  Patient presents with  . Nasal Congestion   The history is provided by the patient. No language interpreter was used.    HPI Comments: Steven PomfretRandy L [name] is a 35 y.o. male with a hx of asthma and COPD who presents to the Emergency Department complaining of gradually worsening non-productive cough onset one week ago. He reports associated nasal congestion. He states that he has been using his 20 mg Prednisone yesterday, nebulizer treatments, rescue inhaler, and home night O2 with no relief. He states that he saw his PCP 5 days ago who told him that he might be coming down with Bronchitis but did not receive any medication. No fever   He states that he has been pulling ticks off him approx. once every few days.     Past Medical History  Diagnosis Date  . Asthma   . Arthritis     knee  . Chronic mental illness   . COPD (chronic obstructive pulmonary disease) (HCC)   . Pulmonary fibrosis (HCC)   . Depression   . Anxiety   . Heart palpitations    Past Surgical History  Procedure Laterality Date  . None     Family History  Problem Relation Age of Onset  . COPD Mother   . Asthma Mother   . Mental illness Mother   . COPD Father   . Pulmonary fibrosis Father   . Lung cancer Father    Social History  Substance Use Topics  . Smoking status: Former Smoker -- 0.50 packs/day for 2 years    Types: Cigarettes    Quit date: 10/31/2000  . Smokeless tobacco: Current User    Types: Chew     Comment: 2003  . Alcohol Use: No    Review of Systems  Respiratory: Positive for cough.   All other systems reviewed and are negative.     Allergies  Diphenhydramine; Guaifenesin;  and Amoxicillin  Home Medications   Prior to Admission medications   Medication Sig Start Date End Date Taking? Authorizing Provider  acetaminophen (TYLENOL) 500 MG tablet Take 1,000 mg by mouth every 6 (six) hours as needed for moderate pain.    Historical Provider, MD  albuterol (PROVENTIL HFA;VENTOLIN HFA) 108 (90 BASE) MCG/ACT inhaler Inhale 2 puffs into the lungs every 6 (six) hours as needed.    Historical Provider, MD  clonazePAM (KLONOPIN) 1 MG tablet Take 1 mg by mouth 2 (two) times daily as needed.    Historical Provider, MD  COMBIVENT RESPIMAT 20-100 MCG/ACT AERS respimat Inhale 1 puff into the lungs every 6 (six) hours as needed for wheezing.  01/20/16   Historical Provider, MD  EPIPEN 2-PAK 0.3 MG/0.3ML SOAJ injection  12/03/15   Historical Provider, MD  escitalopram (LEXAPRO) 20 MG tablet Take 20 mg by mouth daily. 02/18/15   Historical Provider, MD  fexofenadine (ALLEGRA) 180 MG tablet Take 180 mg by mouth daily.    Historical Provider, MD  fexofenadine-pseudoephedrine (ALLEGRA-D 24) 180-240 MG 24 hr tablet Take 1 tablet by mouth daily as needed (will take when allergies get "really bad").    Historical Provider, MD  fluticasone (FLONASE) 50 MCG/ACT nasal spray Place 1 spray into  both nostrils daily. 06/23/15   Historical Provider, MD  Fluticasone Furoate-Vilanterol (BREO ELLIPTA) 200-25 MCG/INH AEPB Inhale 1 puff into the lungs daily. 12/10/15   Vishal Mungal, MD  lisinopril (PRINIVIL,ZESTRIL) 10 MG tablet Take 5 mg by mouth. 12/03/15 01/02/16  Historical Provider, MD  omeprazole (PRILOSEC) 20 MG capsule Take 20 mg by mouth daily.    Historical Provider, MD  predniSONE (DELTASONE) 10 MG tablet Take 6 tablets (60 mg total) by mouth daily. 03/28/16   [name], MD   BP 132/75 mmHg  Pulse 108  Temp(Src) 99 F (37.2 C) (Oral)  Resp 20  Ht  (1.702 m)  Wt 268 lb (121.564 kg)  BMI 41.96 kg/m2  SpO2 97% Physical Exam  HENT:  Mouth/Throat: Oropharynx is clear and moist. No  oropharyngeal exudate.  Pulmonary/Chest:  Lungs are clear to auscultation  Musculoskeletal:  No bilateral calf swelling or tenderness  Lymphadenopathy:    He has no cervical adenopathy.    ED Course  Procedures (including critical care time) DIAGNOSTIC STUDIES: Oxygen Saturation is 97% on RA, normal by my interpretation.    COORDINATION OF CARE: 1:52 AM- Pt advised of plan for treatment and pt agrees. Pt will receive chest x-ray for further evaluation. He will also receive Prednisone. Explained that if results are negative, he will not need antibiotics.    Labs Review Labs Reviewed - No data to display  Imaging Review Dg Chest 2 View  03/28/2016  CLINICAL DATA:  Nasal congestion, cough and headache. Symptoms for 6 days, progressive. EXAM: CHEST  2 VIEW COMPARISON:  07/11/2015 FINDINGS: The cardiomediastinal contours are normal. Mild left basilar scarring is unchanged. Pulmonary vasculature is normal. No consolidation, pleural effusion, or pneumothorax. No acute osseous abnormalities are seen. IMPRESSION: No acute pulmonary process. Electronically Signed   By: Rubye Oaks M.D.   On: 03/28/2016 01:08     [name], MD has personally reviewed and evaluated these images and lab results as part of his medical decision-making.   EKG Interpretation None      MDM   Final diagnoses:  Chronic obstructive pulmonary disease, unspecified COPD type (HCC)  Tick bite    I personally performed the services described in this documentation, which was scribed in my presence. The recorded information has been reviewed and is accurate.  PT comes in with cc of dib. Has COPD. He is having URI like symptoms.Lungas are clear. CXr is clear. Pt has no hx of PE, DVT and denies any exogenous estrogen use, long distance travels or surgery in the past 6 weeks, active cancer, recent immobilization. Will tx with prednisone. He will see his pcp in 1 week.  Also reports multiple tick bites spannig  the last few days, including one within the last 2 days. Discussed the risk of lyme dz. No signs of primary lyme right now.  Strict ER return precautions have been discussed, and patient is agreeing with the plan and is comfortable with the workup done and the recommendations from the ER.      [name], MD 03/28/16 1610

## 2016-03-28 NOTE — Discharge Instructions (Signed)
We saw you in the ER for the shortness of breath. Chest X-RAY is normal. Exam is normal. We are not sure what is causing your discomfort, but we feel comfortable sending you home at this time. The workup in the ER is not complete, and you should follow up with your primary care doctor for further evaluation.   Please start prednisone. It should help with a flair up. Please see your doctor immediately for lyme disease evaluation if you develop rash or joint aches. Read the information below on tick bite. We gave you a dose of doxy now, which shold protect you from the tick bite you had 2 days ago, but it wont protect you from the tick bite you had several days ago.  Please return to the ER if you have worsening chest pain, shortness of breath, pain radiating to your jaw, shoulder, or back, sweats or fainting. Otherwise see the Cardiologist or your primary care doctor as requested.    Chronic Bronchitis Chronic bronchitis is a lasting inflammation of the bronchial tubes, which are the tubes that carry air into your lungs. This is inflammation that occurs:   On most days of the week.   For at least three months at a time.   Over a period of two years in a row. When the bronchial tubes are inflamed, they start to produce mucus. The inflammation and buildup of mucus make it more difficult to breathe. Chronic bronchitis is usually a permanent problem and is one type of chronic obstructive pulmonary disease (COPD). People with chronic bronchitis are at greater risk for getting repeated colds, or respiratory infections. CAUSES  Chronic bronchitis most often occurs in people who have:  Long-standing, severe asthma.  A history of smoking.  Asthma and who also smoke. SIGNS AND SYMPTOMS  Chronic bronchitis may cause the following:  1. A cough that brings up mucus (productive cough). 2. Shortness of breath. 3. Early morning headache. 4. Wheezing. 5. Chest discomfort.  6. Recurring  respiratory infections. DIAGNOSIS  Your health care provider may confirm the diagnosis by:  Taking your medical history.  Performing a physical exam.  Taking a chest X-ray.   Performing pulmonary function tests. TREATMENT  Treatment involves controlling symptoms with medicines, oxygen therapy, or making lifestyle changes, such as exercising and eating a healthy, well-balanced diet. Medicines could include:  Inhalers to improve air flow in and out of your lungs.  Antibiotics to treat bacterial infections, such as pneumonia, sinus infections, and acute bronchitis. As a preventative measure, your health care provider may recommend routine vaccinations for influenza and pneumonia. This is to prevent infection and hospitalization since you may be more at risk for these types of infections.  HOME CARE INSTRUCTIONS  Take medicines only as directed by your health care provider.   If you smoke cigarettes, chew tobacco, or use electronic cigarettes, quit. If you need help quitting, ask your health care provider.  Avoid pollen, dust, animal dander, molds, smoke, and other things that cause shortness of breath or wheezing attacks.  Talk to your health care provider about possible exercise routines. Regular exercise is very important to help you feel better.  If you are prescribed oxygen use at home follow these guidelines:  Never smoke while using oxygen. Oxygen does not burn or explode, but flammable materials will burn faster in the presence of oxygen.  Keep a Government social research officer close by. Let your fire department know that you have oxygen in your home.  Warn visitors not to  smoke near you when you are using oxygen. Put up "no smoking" signs in your home where you most often use the oxygen.  Regularly test your smoke detectors at home to make sure they work. If you receive care in your home from a nurse or other health care provider, he or she may also check to make sure your smoke  detectors work.  Ask your health care provider whether you would benefit from a pulmonary rehabilitation program.  Do not wait to get medical care if you have any concerning symptoms. Delays could cause permanent injury and may be life threatening. SEEK MEDICAL CARE IF:  You have increased coughing or shortness of breath or both.  You have muscle aches.  You have chest pain.  Your mucus gets thicker.  Your mucus changes from clear or white to yellow, green, gray, or bloody. SEEK IMMEDIATE MEDICAL CARE IF:  Your usual medicines do not stop your wheezing.   You have increased difficulty breathing.   You have any problems with the medicine you are taking, such as a rash, itching, swelling, or trouble breathing. MAKE SURE YOU:   Understand these instructions.  Will watch your condition.  Will get help right away if you are not doing well or get worse.   This information is not intended to replace advice given to you by your health care provider. Make sure you discuss any questions you have with your health care provider.   Document Released: 08/04/2006 Document Revised: 11/07/2014 Document Reviewed: 11/25/2013 Elsevier Interactive Patient Education 2016 [employer]   Tick Bite Information Ticks are insects that attach themselves to the skin and draw blood for food. There are various types of ticks. Common types include wood ticks and deer ticks. Most ticks live in shrubs and grassy areas. Ticks can climb onto your body when you make contact with leaves or grass where the tick is waiting. The most common places on the body for ticks to attach themselves are the scalp, neck, armpits, waist, and groin. Most tick bites are harmless, but sometimes ticks carry germs that cause diseases. These germs can be spread to a person during the tick's feeding process. The chance of a disease spreading through a tick bite depends on:   The type of tick.  Time of year.   How long the  tick is attached.   Geographic location.  HOW CAN YOU PREVENT TICK BITES? Take these steps to help prevent tick bites when you are outdoors:  Wear protective clothing. Long sleeves and long pants are best.   Wear white clothes so you can see ticks more easily.  Tuck your pant legs into your socks.   If walking on a trail, stay in the middle of the trail to avoid brushing against bushes.  Avoid walking through areas with long grass.  Put insect repellent on all exposed skin and along boot tops, pant legs, and sleeve cuffs.   Check clothing, hair, and skin repeatedly and before going inside.   Brush off any ticks that are not attached.  Take a shower or bath as soon as possible after being outdoors.  WHAT IS THE PROPER WAY TO REMOVE A TICK? Ticks should be removed as soon as possible to help prevent diseases caused by tick bites. 7. If latex gloves are available, put them on before trying to remove a tick.  8. Using fine-point tweezers, grasp the tick as close to the skin as possible. You may also use curved forceps or  a tick removal tool. Grasp the tick as close to its head as possible. Avoid grasping the tick on its body. 9. Pull gently with steady upward pressure until the tick lets go. Do not twist the tick or jerk it suddenly. This may break off the tick's head or mouth parts. 10. Do not squeeze or crush the tick's body. This could force disease-carrying fluids from the tick into your body.  11. After the tick is removed, wash the bite area and your hands with soap and water or other disinfectant such as alcohol. 12. Apply a small amount of antiseptic cream or ointment to the bite site.  13. Wash and disinfect any instruments that were used.  Do not try to remove a tick by applying a hot match, petroleum jelly, or fingernail polish to the tick. These methods do not work and may increase the chances of disease being spread from the tick bite.  WHEN SHOULD YOU SEEK  MEDICAL CARE? Contact your health care provider if you are unable to remove a tick from your skin or if a part of the tick breaks off and is stuck in the skin.  After a tick bite, you need to be aware of signs and symptoms that could be related to diseases spread by ticks. Contact your health care provider if you develop any of the following in the days or weeks after the tick bite:  Unexplained fever.  Rash. A circular rash that appears days or weeks after the tick bite may indicate the possibility of Lyme disease. The rash may resemble a target with a bull's-eye and may occur at a different part of your body than the tick bite.  Redness and swelling in the area of the tick bite.   Tender, swollen lymph glands.   Diarrhea.   Weight loss.   Cough.   Fatigue.   Muscle, joint, or bone pain.   Abdominal pain.   Headache.   Lethargy or a change in your level of consciousness.  Difficulty walking or moving your legs.   Numbness in the legs.   Paralysis.  Shortness of breath.   Confusion.   Repeated vomiting.    This information is not intended to replace advice given to you by your health care provider. Make sure you discuss any questions you have with your health care provider.   Document Released: 10/14/2000 Document Revised: 11/07/2014 Document Reviewed: 03/27/2013 Elsevier Interactive Patient Education [employer]

## 2016-03-28 NOTE — Progress Notes (Signed)
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  acetaminophen (TYLENOL) 500 MG tablet, Take 1,000 mg by mouth every 6 (six) hours as needed for moderate pain., Disp: , Rfl:  .  albuterol (PROVENTIL HFA;VENTOLIN HFA) 108 (90 BASE) MCG/ACT inhaler, Inhale 2 puffs into the lungs every 6 (six) hours as needed., Disp: , Rfl:  .  clonazePAM (KLONOPIN) 1 MG tablet, Take 1 mg by mouth 2 (two) times daily as needed., Disp: , Rfl:  .  COMBIVENT RESPIMAT 20-100 MCG/ACT AERS respimat, Inhale 1 puff into the lungs every 6 (six) hours as needed for wheezing. , Disp: , Rfl:  .  EPIPEN 2-PAK 0.3 MG/0.3ML SOAJ injection, , Disp: , Rfl:  .  escitalopram (LEXAPRO) 20 MG tablet, Take 20 mg by mouth daily., Disp: , Rfl:  .  fexofenadine (ALLEGRA) 180 MG tablet, Take 180 mg by mouth daily., Disp: , Rfl:  .  fexofenadine-pseudoephedrine (ALLEGRA-D 24) 180-240 MG 24 hr tablet, Take 1 tablet by mouth daily as needed (will take when allergies get "really bad")., Disp: , Rfl:  .  fluticasone (FLONASE) 50 MCG/ACT nasal spray, Place 1 spray into both nostrils daily., Disp: , Rfl:  .  Fluticasone Furoate-Vilanterol (BREO ELLIPTA) 200-25 MCG/INH AEPB, Inhale 1 puff into the lungs daily., Disp: 60 each, Rfl: 5 .  lisinopril (PRINIVIL,ZESTRIL) 10 MG tablet, Take 5 mg by mouth., Disp: , Rfl:  .  omeprazole (PRILOSEC) 20 MG capsule, Take 20 mg by mouth daily., Disp: , Rfl:  .  predniSONE (DELTASONE) 10 MG tablet, Take 6 tablets (60 mg total) by mouth daily., Disp: 30 tablet, Rfl: 0  Past Medical  History: Past Medical History  Diagnosis Date  . Asthma   . Arthritis     knee  . Chronic mental illness   . COPD (chronic obstructive pulmonary disease) (HCC)   . Pulmonary fibrosis (HCC)   . Depression   . Anxiety   . Heart palpitations     Tobacco Use: History  Smoking status  . Former Smoker -- 0.50 packs/day for 2 years  . Types: Cigarettes  . Quit date: 10/31/2000  Smokeless tobacco  . Current User  . Types: Chew    Comment: 2003    Labs:     Recent Review Flowsheet Data    There is no flowsheet data to display.      Capillary Blood Glucose: No results found for: GLUCAP   ADL UCSD:     Pulmonary Assessment Scores      02/09/16 1627       ADL UCSD   ADL Phase Entry     SOB Score total 57     Rest 1     Walk 2     Stairs 3     Bath 2     Dress 1     Shop 2     CAT Score   CAT Score 28     mMRC Score   mMRC Score 3        Pulmonary Function Assessment:   Exercise Target Goals:    Exercise Program  Goal: Individual exercise prescription set with THRR, safety & activity barriers. Participant demonstrates ability to understand and report RPE using BORG scale, to self-measure pulse accurately, and to acknowledge the importance of the exercise prescription.  Exercise Prescription Goal: Starting with aerobic activity 30 plus minutes a day, 3 days per week for initial exercise prescription. Provide home exercise prescription and guidelines that participant acknowledges understanding prior to discharge.  Activity Barriers & Risk Stratification:     Activity Barriers & Cardiac Risk Stratification - 02/09/16 1547    Activity Barriers & Cardiac Risk Stratification   Activity Barriers None   Cardiac Risk Stratification Low      6 Minute Walk:     6 Minute Walk      02/10/16 1333       6 Minute Walk   Phase Initial     Distance 1200 feet     Walk Time 6 minutes     # of Rest Breaks 0     MPH 2.27     METS 2.74     RPE 11      Perceived Dyspnea  13     VO2 Peak 14.8     Symptoms Yes (comment)     Comments Patient c/o of chest discomfort 2 out of 10 and leg pain 6 out 10 on pain scale.      Resting HR 81 bpm     Resting BP 108/80 mmHg     Max Ex. HR 107 bpm     Max Ex. BP 148/84 mmHg     2 Minute Post BP 132/78 mmHg        Initial Exercise Prescription:     Initial Exercise Prescription - 02/09/16 1445    Date of Initial Exercise RX and Referring Provider   Date 02/09/16   Treadmill   MPH 1.5   Grade 0   Minutes 15   METs 2.15   NuStep   Level 2   Minutes 15   METs 1.5   Prescription Details   Frequency (times per week) 3   Duration Progress to 30 minutes of continuous aerobic without signs/symptoms of physical distress   Intensity   THRR REST +  20   THRR 40-80% of Max Heartrate 124-145   Ratings of Perceived Exertion 11-13   Perceived Dyspnea 0-4   Progression   Progression Continue to progress workloads to maintain intensity without signs/symptoms of physical distress.   Resistance Training   Training Prescription Yes   Weight 1.0   Reps 10-12      Perform Capillary Blood Glucose checks as needed.  Exercise Prescription Changes:      Exercise Prescription Changes      02/27/16 2000 03/28/16 1100         Exercise Review   Progression Yes Yes      Response to Exercise   Blood Pressure (Admit) 108/70 mmHg 110/78 mmHg      Blood Pressure (Exercise) 142/70 mmHg 132/88 mmHg      Blood Pressure (Exit) 108/50 mmHg 118/80 mmHg      Heart Rate (Admit) 87 bpm 107 bpm      Heart Rate (Exercise) 105 bpm 107 bpm      Heart Rate (Exit) 95 bpm 121 bpm      Oxygen Saturation (Admit)  95 %      Oxygen Saturation (Exercise)  93 %      Oxygen Saturation (Exit)  94 %      Rating  of Perceived Exertion (Exercise) 9 10      Perceived Dyspnea (Exercise) 10 10      Symptoms None Yes      Comments  On 03/22/16 had to stop patient due to chest tightness 6/10 during treadmill with BP of 160/100.  Patient rested. BP came down to 118/80 with pain 2/10. When patient left gym to go home he did not have pain. Was instructed to call doctor or go to ED if pain returned.       Duration Progress to 30 minutes of continuous aerobic without signs/symptoms of physical distress Progress to 30 minutes of continuous aerobic without signs/symptoms of physical distress      Intensity Rest + 20 Rest + 20      Progression   Progression Continue to progress workloads to maintain intensity without signs/symptoms of physical distress. Continue to progress workloads to maintain intensity without signs/symptoms of physical distress.      Resistance Training   Training Prescription Yes Yes      Weight 1.0 3.0      Reps 10-12 10-12      Interval Training   Interval Training No No      Treadmill   MPH 2 2.2      Grade 0 0      Minutes 15 15      METs 2.5 2.68      NuStep   Level 2 2      Minutes 15 15      METs 3.76 3.75      Home Exercise Plan   Plans to continue exercise at Home Home      Frequency Add 2 additional days to program exercise sessions. Add 2 additional days to program exercise sessions.         Exercise Comments:      Exercise Comments      02/27/16 2032 03/28/16 1142         Exercise Comments Patient is progressing appropriately.  On 03/22/16 had to stop patient due to chest tightness 6/10 during treadmill with BP of 160/100. Patient rested. BP came down to 118/80 with pain 2/10. When patient left gym to go home he did not have pain. Was instructed to call doctor or go to ED if pain returned.          Discharge Exercise Prescription (Final Exercise Prescription Changes):     Exercise Prescription Changes - 03/28/16 1100    Exercise Review   Progression Yes   Response to Exercise   Blood Pressure (Admit) 110/78 mmHg   Blood Pressure (Exercise) 132/88 mmHg   Blood Pressure (Exit) 118/80 mmHg   Heart Rate (Admit) 107 bpm   Heart Rate (Exercise) 107 bpm   Heart Rate (Exit)  121 bpm   Oxygen Saturation (Admit) 95 %   Oxygen Saturation (Exercise) 93 %   Oxygen Saturation (Exit) 94 %   Rating of Perceived Exertion (Exercise) 10   Perceived Dyspnea (Exercise) 10   Symptoms Yes   Comments On 03/22/16 had to stop patient due to chest tightness 6/10 during treadmill with BP of 160/100. Patient rested. BP came down to 118/80 with pain 2/10. When patient left gym to go home he did not have pain. Was instructed to call doctor or go to ED if pain returned.    Duration Progress to 30 minutes of continuous aerobic without signs/symptoms of physical distress   Intensity Rest + 20   Progression   Progression Continue to  progress workloads to maintain intensity without signs/symptoms of physical distress.   Resistance Training   Training Prescription Yes   Weight 3.0   Reps 10-12   Interval Training   Interval Training No   Treadmill   MPH 2.2   Grade 0   Minutes 15   METs 2.68   NuStep   Level 2   Minutes 15   METs 3.75   Home Exercise Plan   Plans to continue exercise at Home   Frequency Add 2 additional days to program exercise sessions.      Nutrition:  Target Goals: Understanding of nutrition guidelines, daily intake of sodium 1500mg , cholesterol 200mg , calories 30% from fat and 7% or less from saturated fats, daily to have 5 or more servings of fruits and vegetables.  Biometrics:     Pre Biometrics - 02/10/16 1350    Pre Biometrics   Waist Circumference 46 inches   Hip Circumference 43 inches   Waist to Hip Ratio 1.07 %   Triceps Skinfold 20 mm   % Body Fat 36.4 %   Grip Strength 110 kg   Flexibility 16 in   Single Leg Stand 60 seconds       Nutrition Therapy Plan and Nutrition Goals:     Nutrition Therapy & Goals - 02/09/16 1625    Personal Nutrition Goals   Personal Goal #1 increase dairy   Personal Goal #2 use fruit popsicles or sherbet instead of ice cream      Nutrition Discharge: Rate Your Plate Scores:     Nutrition  Assessments - 02/09/16 1548    Rate Your Plate Scores   Pre Score 45      Psychosocial: Target Goals: Acknowledge presence or absence of depression, maximize coping skills, provide positive support system. Participant is able to verbalize types and ability to use techniques and skills needed for reducing stress and depression.  Initial Review & Psychosocial Screening:     Initial Psych Review & Screening - 02/09/16 1616    Initial Review   Current issues with Current Depression;Current Anxiety/Panic   Family Dynamics   Good Support System? Yes   Concerns --  none   Barriers   Psychosocial barriers to participate in program The patient should benefit from training in stress management and relaxation.   Screening Interventions   Interventions Encouraged to exercise      Quality of Life Scores:     Quality of Life - 02/10/16 1352    Quality of Life Scores   Health/Function Pre 20.69 %   Socioeconomic Pre 20 %   Psych/Spiritual Pre 21 %   Family Pre 33 %   GLOBAL Pre 22.44 %      PHQ-9:     Recent Review Flowsheet Data    Depression screen [facility] 2/9 02/09/2016   Decreased Interest 2   Down, Depressed, Hopeless 3   PHQ - 2 Score 5   Altered sleeping 3   Tired, decreased energy 3   Change in appetite 3   Feeling bad or failure about yourself  3   Trouble concentrating 2   Moving slowly or fidgety/restless 3   Suicidal thoughts 1   PHQ-9 Score 23   Difficult doing work/chores Extremely dIfficult      Psychosocial Evaluation and Intervention:     Psychosocial Evaluation - 02/09/16 1618    Psychosocial Evaluation & Interventions   Interventions Stress management education;Relaxation education;Encouraged to exercise with the program and follow exercise prescription  Continued Psychosocial Services Needed No   Discharge Psychosocial Assessment & Intervention   Discharge Continue support measures as needed      Psychosocial Re-Evaluation:     Psychosocial  Re-Evaluation      02/26/16 1445 03/23/16 1455         Psychosocial Re-Evaluation   Interventions Relaxation education;Encouraged to attend Pulmonary Rehabilitation for the exercise;Stress management education Relaxation education;Encouraged to attend Pulmonary Rehabilitation for the exercise;Stress management education      Continued Psychosocial Services Needed No No         Education: Education Goals: Education classes will be provided on a weekly basis, covering required topics. Participant will state understanding/return demonstration of topics presented.  Learning Barriers/Preferences:     Learning Barriers/Preferences - 02/09/16 1547    Learning Barriers/Preferences   Learning Barriers None   Learning Preferences Skilled Demonstration      Education Topics: How Lungs Work and Diseases: - Discuss the anatomy of the lungs and diseases that can affect the lungs, such as COPD.   Exercise: -Discuss the importance of exercise, FITT principles of exercise, normal and abnormal responses to exercise, and how to exercise safely.      PULMONARY REHAB OTHER RESPIRATORY from 03/17/2016 in [place] [facility]   Date  03/10/16   Educator  Hart Rochester   Instruction Review Code  2- meets goals/outcomes      Environmental Irritants: -Discuss types of environmental irritants and how to limit exposure to environmental irritants.      PULMONARY REHAB OTHER RESPIRATORY from 03/17/2016 in [place] [facility]   Date  03/17/16   Educator  DC   Instruction Review Code  2- meets goals/outcomes      Meds/Inhalers and oxygen: - Discuss respiratory medications, definition of an inhaler and oxygen, and the proper way to use an inhaler and oxygen.   Energy Saving Techniques: - Discuss methods to conserve energy and decrease shortness of breath when performing activities of daily living.    Bronchial Hygiene / Breathing Techniques: - Discuss breathing mechanics,  pursed-lip breathing technique,  proper posture, effective ways to clear airways, and other functional breathing techniques   Cleaning Equipment: - Provides group verbal and written instruction about the health risks of elevated stress, cause of high stress, and healthy ways to reduce stress.   Nutrition I: Fats: - Discuss the types of cholesterol, what cholesterol does to the body, and how cholesterol levels can be controlled.   Nutrition II: Labels: -Discuss the different components of food labels and how to read food labels.   Respiratory Infections: - Discuss the signs and symptoms of respiratory infections, ways to prevent respiratory infections, and the importance of seeking medical treatment when having a respiratory infection.   Stress I: Signs and Symptoms: - Discuss the causes of stress, how stress may lead to anxiety and depression, and ways to limit stress.   Stress II: Relaxation: -Discuss relaxation techniques to limit stress.      PULMONARY REHAB OTHER RESPIRATORY from 03/17/2016 in [place] [facility]   Date  02/18/16   Educator  Hart Rochester   Instruction Review Code  2- meets goals/outcomes      Oxygen for Home/Travel: - Discuss how to prepare for travel when on oxygen and proper ways to transport and store oxygen to ensure safety.          PULMONARY REHAB OTHER RESPIRATORY from 03/17/2016 in [place] [facility]   Date  02/25/16   Educator  Hart Rochesteriane Sanam Marmo   Instruction Review Code  2- meets goals/outcomes      Knowledge Questionnaire Score:     Knowledge Questionnaire Score - 02/09/16 1547    Knowledge Questionnaire Score   Pre Score 10/14      Core Components/Risk Factors/Patient Goals at Admission:     Personal Goals and Risk Factors at Admission - 02/09/16 1548    Core Components/Risk Factors/Patient Goals on Admission    Weight Management Yes   Intervention Weight Management: Provide education and appropriate  resources to help participant work on and attain dietary goals.;Weight Management/Obesity: Establish reasonable short term and long term weight goals.   Expected Outcomes Short Term: Continue to assess and modify interventions until short term weight is achieved   Sedentary Yes   Intervention Provide advice, education, support and counseling about physical activity/exercise needs.;Develop an individualized exercise prescription for aerobic and resistive training based on initial evaluation findings, risk stratification, comorbidities and participant's personal goals.   Expected Outcomes Achievement of increased cardiorespiratory fitness and enhanced flexibility, muscular endurance and strength shown through measurements of functional capacity and personal statement of participant.   Tobacco Cessation Yes   Intervention Education officer, environmentalffer self-teaching materials, assist with locating and accessing local/national Quit Smoking programs, and support quit date choice.   Expected Outcomes Short Term: Will demonstrate readiness to quit, by selecting a quit date.   Improve shortness of breath with ADL's Yes   Intervention Provide education, individualized exercise plan and daily activity instruction to help decrease symptoms of SOB with activities of daily living.   Expected Outcomes Short Term: Achieves a reduction of symptoms when performing activities of daily living.   Develop more efficient breathing techniques such as purse lipped breathing and diaphragmatic breathing; and practicing self-pacing with activity Yes   Intervention Provide education, demonstration and support about specific breathing techniuqes utilized for more efficient breathing. Include techniques such as pursed lipped breathing, diaphragmatic breathing and self-pacing activity.   Expected Outcomes Short Term: Participant will be able to demonstrate and use breathing techniques as needed throughout daily activities.   Stress Yes   Intervention Offer  individual and/or small group education and counseling on adjustment to heart disease, stress management and health-related lifestyle change. Teach and support self-help strategies.   Expected Outcomes Long Term: Emotional wellbeing is indicated by absence of clinically significant psychosocial distress or social isolation.;Short Term: Participant demonstrates changes in health-related behavior, relaxation and other stress management skills, ability to obtain effective social support, and compliance with psychotropic medications if prescribed.      Core Components/Risk Factors/Patient Goals Review:      Goals and Risk Factor Review      02/09/16 1552 02/26/16 1444 03/23/16 1453       Core Components/Risk Factors/Patient Goals Review   Personal Goals Review Weight Management/Obesity;Sedentary;Tobacco Cessation;Improve shortness of breath with ADL's;Develop more efficient breathing techniques such as purse lipped breathing and diaphragmatic breathing and practicing self-pacing with activity.;Stress Weight Management/Obesity;Sedentary;Tobacco Cessation;Improve shortness of breath with ADL's;Develop more efficient breathing techniques such as purse lipped breathing and diaphragmatic breathing and practicing self-pacing with activity.;Stress Weight Management/Obesity;Sedentary;Tobacco Cessation;Improve shortness of breath with ADL's;Develop more efficient breathing techniques such as purse lipped breathing and diaphragmatic breathing and practicing self-pacing with activity.;Stress     Review  Patient still smoking. has only attended 2 sessions so far in program.  patient has cut down on smoking some.  Progressing well in the program. Patient has lost 5 lbs since starting program.  Expected Outcomes  lose weight, decrease SOB and stress, Stop or slow down smoking lose weight, decrease SOB and stress, Stop or slow down smoking        Core Components/Risk Factors/Patient Goals at Discharge (Final  Review):      Goals and Risk Factor Review - 03/23/16 1453    Core Components/Risk Factors/Patient Goals Review   Personal Goals Review Weight Management/Obesity;Sedentary;Tobacco Cessation;Improve shortness of breath with ADL's;Develop more efficient breathing techniques such as purse lipped breathing and diaphragmatic breathing and practicing self-pacing with activity.;Stress   Review patient has cut down on smoking some.  Progressing well in the program. Patient has lost 5 lbs since starting program.     Expected Outcomes lose weight, decrease SOB and stress, Stop or slow down smoking      ITP Comments:     ITP Comments      02/09/16 1629           ITP Comments Patient is a 35 year old male who lives with wife and kids.  Patient states he has strong history of anxiety and depression and already see counselor for this.  patient very personable and did not show any S/S of anxiety or depression at this time. Will continue to monitor.           Comments: ITP REVIEW Pt is making expected progress toward personal goals after completing 9 sessions.   Recommend continued exercise, life style modification, education, and utilization of breathing techniques to increase stamina and strength and decrease shortness of breath with exertion. On 03/22/16 had to stop patient due to chest tightness 6/10 during treadmill with BP of 160/100. Patient rested. BP came down to 118/80 with pain 2/10. When patient left gym to go home he did not have pain. Was instructed to call doctor or go to ED if pain returned.

## 2016-03-28 NOTE — ED Notes (Signed)
Pt c/o nasal congestion with cough and headache; pt states he saw his PCP x 6 days ago and was told he may be getting bronchitis but no meds were prescribed; pt states he feels like he is getting worse

## 2016-03-29 ENCOUNTER — Encounter (HOSPITAL_COMMUNITY): Payer: Medicare Other

## 2016-03-31 ENCOUNTER — Encounter (HOSPITAL_COMMUNITY): Payer: Medicare Other

## 2016-04-05 ENCOUNTER — Encounter (HOSPITAL_COMMUNITY): Payer: Medicare Other

## 2016-04-07 ENCOUNTER — Encounter (HOSPITAL_COMMUNITY): Admission: RE | Admit: 2016-04-07 | Payer: Medicare Other | Source: Ambulatory Visit

## 2016-04-12 ENCOUNTER — Encounter (HOSPITAL_COMMUNITY): Admission: RE | Admit: 2016-04-12 | Payer: Medicare Other | Source: Ambulatory Visit

## 2016-04-14 ENCOUNTER — Encounter (HOSPITAL_COMMUNITY): Payer: Medicare Other

## 2016-04-19 ENCOUNTER — Encounter (HOSPITAL_COMMUNITY): Payer: Medicare Other

## 2016-04-21 ENCOUNTER — Encounter (HOSPITAL_COMMUNITY): Payer: Medicare Other

## 2016-04-25 NOTE — Progress Notes (Signed)
Pulmonary Individual Treatment Plan  Patient Details  Name: Steven [name] MRN: [id number] Date of Birth: [birth year] Referring Provider:    Initial Encounter Date:       PULMONARY REHAB OTHER RESP ORIENTATION from 02/09/2016 in [facility]   Date  02/09/16      Visit Diagnosis: No diagnosis found.  Patient's Home Medications on Admission:   Current outpatient prescriptions:  .  acetaminophen (TYLENOL) 500 MG tablet, Take 1,000 mg by mouth every 6 (six) hours as needed for moderate pain., Disp: , Rfl:  .  albuterol (PROVENTIL HFA;VENTOLIN HFA) 108 (90 BASE) MCG/ACT inhaler, Inhale 2 puffs into the lungs every 6 (six) hours as needed., Disp: , Rfl:  .  clonazePAM (KLONOPIN) 1 MG tablet, Take 1 mg by mouth 2 (two) times daily as needed., Disp: , Rfl:  .  COMBIVENT RESPIMAT 20-100 MCG/ACT AERS respimat, Inhale 1 puff into the lungs every 6 (six) hours as needed for wheezing. , Disp: , Rfl:  .  EPIPEN 2-PAK 0.3 MG/0.3ML SOAJ injection, , Disp: , Rfl:  .  escitalopram (LEXAPRO) 20 MG tablet, Take 20 mg by mouth daily., Disp: , Rfl:  .  fexofenadine (ALLEGRA) 180 MG tablet, Take 180 mg by mouth daily., Disp: , Rfl:  .  fexofenadine-pseudoephedrine (ALLEGRA-D 24) 180-240 MG 24 hr tablet, Take 1 tablet by mouth daily as needed (will take when allergies get "really bad")., Disp: , Rfl:  .  fluticasone (FLONASE) 50 MCG/ACT nasal spray, Place 1 spray into both nostrils daily., Disp: , Rfl:  .  Fluticasone Furoate-Vilanterol (BREO ELLIPTA) 200-25 MCG/INH AEPB, Inhale 1 puff into the lungs daily., Disp: 60 each, Rfl: 5 .  lisinopril (PRINIVIL,ZESTRIL) 10 MG tablet, Take 5 mg by mouth., Disp: , Rfl:  .  omeprazole (PRILOSEC) 20 MG capsule, Take 20 mg by mouth daily., Disp: , Rfl:  .  predniSONE (DELTASONE) 10 MG tablet, Take 6 tablets (60 mg total) by mouth daily., Disp: 30 tablet, Rfl: 0  Past Medical History: Past Medical History  Diagnosis Date  . Asthma   . Arthritis      knee  . Chronic mental illness   . COPD (chronic obstructive pulmonary disease) (HCC)   . Pulmonary fibrosis (HCC)   . Depression   . Anxiety   . Heart palpitations     Tobacco Use: History  Smoking status  . Former Smoker -- 0.50 packs/day for 2 years  . Types: Cigarettes  . Quit date: 10/31/2000  Smokeless tobacco  . Current User  . Types: Chew    Comment: 2003    Labs:     Recent Review Flowsheet Data    There is no flowsheet data to display.      Capillary Blood Glucose: No results found for: GLUCAP   ADL UCSD:     Pulmonary Assessment Scores      02/09/16 1627       ADL UCSD   ADL Phase Entry     SOB Score total 57     Rest 1     Walk 2     Stairs 3     Bath 2     Dress 1     Shop 2     CAT Score   CAT Score 28     mMRC Score   mMRC Score 3        Pulmonary Function Assessment:   Exercise Target Goals:    Exercise Program Goal: Individual exercise prescription set  with THRR, safety & activity barriers. Participant demonstrates ability to understand and report RPE using BORG scale, to self-measure pulse accurately, and to acknowledge the importance of the exercise prescription.  Exercise Prescription Goal: Starting with aerobic activity 30 plus minutes a day, 3 days per week for initial exercise prescription. Provide home exercise prescription and guidelines that participant acknowledges understanding prior to discharge.  Activity Barriers & Risk Stratification:     Activity Barriers & Cardiac Risk Stratification - 02/09/16 1547    Activity Barriers & Cardiac Risk Stratification   Activity Barriers None   Cardiac Risk Stratification Low      6 Minute Walk:     6 Minute Walk      02/10/16 1333       6 Minute Walk   Phase Initial     Distance 1200 feet     Walk Time 6 minutes     # of Rest Breaks 0     MPH 2.27     METS 2.74     RPE 11     Perceived Dyspnea  13     VO2 Peak 14.8     Symptoms Yes (comment)      Comments Patient c/o of chest discomfort 2 out of 10 and leg pain 6 out 10 on pain scale.      Resting HR 81 bpm     Resting BP 108/80 mmHg     Max Ex. HR 107 bpm     Max Ex. BP 148/84 mmHg     2 Minute Post BP 132/78 mmHg        Initial Exercise Prescription:     Initial Exercise Prescription - 02/09/16 1445    Date of Initial Exercise RX and Referring Provider   Date 02/09/16   Treadmill   MPH 1.5   Grade 0   Minutes 15   METs 2.15   NuStep   Level 2   Minutes 15   METs 1.5   Prescription Details   Frequency (times per week) 3   Duration Progress to 30 minutes of continuous aerobic without signs/symptoms of physical distress   Intensity   THRR REST +  20   THRR 40-80% of Max Heartrate 124-145   Ratings of Perceived Exertion 11-13   Perceived Dyspnea 0-4   Progression   Progression Continue to progress workloads to maintain intensity without signs/symptoms of physical distress.   Resistance Training   Training Prescription Yes   Weight 1.0   Reps 10-12      Perform Capillary Blood Glucose checks as needed.  Exercise Prescription Changes:      Exercise Prescription Changes      02/27/16 2000 03/28/16 1100 04/25/16 1000       Exercise Review   Progression Yes Yes Yes     Response to Exercise   Blood Pressure (Admit) 108/70 mmHg 110/78 mmHg 120/72 mmHg     Blood Pressure (Exercise) 142/70 mmHg 132/88 mmHg 122/72 mmHg     Blood Pressure (Exit) 108/50 mmHg 118/80 mmHg 112/80 mmHg     Heart Rate (Admit) 87 bpm 107 bpm 93 bpm     Heart Rate (Exercise) 105 bpm 107 bpm 107 bpm     Heart Rate (Exit) 95 bpm 121 bpm 88 bpm     Oxygen Saturation (Admit)  95 % 95 %     Oxygen Saturation (Exercise)  93 % 93 %     Oxygen Saturation (Exit)  94 % 94 %  Rating of Perceived Exertion (Exercise) 9 10 10      Perceived Dyspnea (Exercise) 10 10 10      Symptoms None Yes Yes     Comments  On 03/22/16 had to stop patient due to chest tightness 6/10 during treadmill with BP of  160/100. Patient rested. BP came down to 118/80 with pain 2/10. When patient left gym to go home he did not have pain. Was instructed to call doctor or go to ED if pain returned.  On 03/22/16 had to stop patient due to chest tightness 6/10 during treadmill with BP of 160/100. Patient rested. BP came down to 118/80 with pain 2/10. When patient left gym to go home he did not have pain. Was instructed to call doctor or go to ED if pain returned.      Duration Progress to 30 minutes of continuous aerobic without signs/symptoms of physical distress Progress to 30 minutes of continuous aerobic without signs/symptoms of physical distress Progress to 30 minutes of continuous aerobic without signs/symptoms of physical distress     Intensity Rest + 20 Rest + 20 Rest + 20     Progression   Progression Continue to progress workloads to maintain intensity without signs/symptoms of physical distress. Continue to progress workloads to maintain intensity without signs/symptoms of physical distress. Continue to progress workloads to maintain intensity without signs/symptoms of physical distress.     Resistance Training   Training Prescription Yes Yes Yes     Weight 1.0 3.0 3.0     Reps 10-12 10-12 10-12     Interval Training   Interval Training No No No     Treadmill   MPH 2 2.2 2.3     Grade 0 0 0     Minutes 15 15 15      METs 2.5 2.68 2.68     NuStep   Level 2 2 2      Minutes 15 15 15      METs 3.76 3.75 3.75     Home Exercise Plan   Plans to continue exercise at Home Home Home     Frequency Add 2 additional days to program exercise sessions. Add 2 additional days to program exercise sessions. Add 2 additional days to program exercise sessions.        Exercise Comments:      Exercise Comments      02/27/16 2032 03/28/16 1142         Exercise Comments Patient is progressing appropriately.  On 03/22/16 had to stop patient due to chest tightness 6/10 during treadmill with BP of 160/100. Patient rested.  BP came down to 118/80 with pain 2/10. When patient left gym to go home he did not have pain. Was instructed to call doctor or go to ED if pain returned.          Discharge Exercise Prescription (Final Exercise Prescription Changes):     Exercise Prescription Changes - 04/25/16 1000    Exercise Review   Progression Yes   Response to Exercise   Blood Pressure (Admit) 120/72 mmHg   Blood Pressure (Exercise) 122/72 mmHg   Blood Pressure (Exit) 112/80 mmHg   Heart Rate (Admit) 93 bpm   Heart Rate (Exercise) 107 bpm   Heart Rate (Exit) 88 bpm   Oxygen Saturation (Admit) 95 %   Oxygen Saturation (Exercise) 93 %   Oxygen Saturation (Exit) 94 %   Rating of Perceived Exertion (Exercise) 10   Perceived Dyspnea (Exercise) 10   Symptoms Yes  Comments On 03/22/16 had to stop patient due to chest tightness 6/10 during treadmill with BP of 160/100. Patient rested. BP came down to 118/80 with pain 2/10. When patient left gym to go home he did not have pain. Was instructed to call doctor or go to ED if pain returned.    Duration Progress to 30 minutes of continuous aerobic without signs/symptoms of physical distress   Intensity Rest + 20   Progression   Progression Continue to progress workloads to maintain intensity without signs/symptoms of physical distress.   Resistance Training   Training Prescription Yes   Weight 3.0   Reps 10-12   Interval Training   Interval Training No   Treadmill   MPH 2.3   Grade 0   Minutes 15   METs 2.68   NuStep   Level 2   Minutes 15   METs 3.75   Home Exercise Plan   Plans to continue exercise at Home   Frequency Add 2 additional days to program exercise sessions.      Nutrition:  Target Goals: Understanding of nutrition guidelines, daily intake of sodium 1500mg , cholesterol 200mg , calories 30% from fat and 7% or less from saturated fats, daily to have 5 or more servings of fruits and vegetables.  Biometrics:     Pre Biometrics - 02/10/16 1350     Pre Biometrics   Waist Circumference 46 inches   Hip Circumference 43 inches   Waist to Hip Ratio 1.07 %   Triceps Skinfold 20 mm   % Body Fat 36.4 %   Grip Strength 110 kg   Flexibility 16 in   Single Leg Stand 60 seconds       Nutrition Therapy Plan and Nutrition Goals:     Nutrition Therapy & Goals - 02/09/16 1625    Personal Nutrition Goals   Personal Goal #1 increase dairy   Personal Goal #2 use fruit popsicles or sherbet instead of ice cream      Nutrition Discharge: Rate Your Plate Scores:     Nutrition Assessments - 02/09/16 1548    Rate Your Plate Scores   Pre Score 45      Psychosocial: Target Goals: Acknowledge presence or absence of depression, maximize coping skills, provide positive support system. Participant is able to verbalize types and ability to use techniques and skills needed for reducing stress and depression.  Initial Review & Psychosocial Screening:     Initial Psych Review & Screening - 02/09/16 1616    Initial Review   Current issues with Current Depression;Current Anxiety/Panic   Family Dynamics   Good Support System? Yes   Concerns --  none   Barriers   Psychosocial barriers to participate in program The patient should benefit from training in stress management and relaxation.   Screening Interventions   Interventions Encouraged to exercise      Quality of Life Scores:     Quality of Life - 02/10/16 1352    Quality of Life Scores   Health/Function Pre 20.69 %   Socioeconomic Pre 20 %   Psych/Spiritual Pre 21 %   Family Pre 33 %   GLOBAL Pre 22.44 %      PHQ-9:     Recent Review Flowsheet Data    Depression screen [facility] 2/9 02/09/2016   Decreased Interest 2   Down, Depressed, Hopeless 3   PHQ - 2 Score 5   Altered sleeping 3   Tired, decreased energy 3   Change in appetite 3  Feeling bad or failure about yourself  3   Trouble concentrating 2   Moving slowly or fidgety/restless 3   Suicidal thoughts 1   PHQ-9  Score 23   Difficult doing work/chores Extremely dIfficult      Psychosocial Evaluation and Intervention:     Psychosocial Evaluation - 02/09/16 1618    Psychosocial Evaluation & Interventions   Interventions Stress management education;Relaxation education;Encouraged to exercise with the program and follow exercise prescription   Continued Psychosocial Services Needed No   Discharge Psychosocial Assessment & Intervention   Discharge Continue support measures as needed      Psychosocial Re-Evaluation:     Psychosocial Re-Evaluation      02/26/16 1445 03/23/16 1455 04/25/16 1128       Psychosocial Re-Evaluation   Interventions Relaxation education;Encouraged to attend Pulmonary Rehabilitation for the exercise;Stress management education Relaxation education;Encouraged to attend Pulmonary Rehabilitation for the exercise;Stress management education Relaxation education;Encouraged to attend Pulmonary Rehabilitation for the exercise;Stress management education     Continued Psychosocial Services Needed No No No        Education: Education Goals: Education classes will be provided on a weekly basis, covering required topics. Participant will state understanding/return demonstration of topics presented.  Learning Barriers/Preferences:     Learning Barriers/Preferences - 02/09/16 1547    Learning Barriers/Preferences   Learning Barriers None   Learning Preferences Skilled Demonstration      Education Topics: How Lungs Work and Diseases: - Discuss the anatomy of the lungs and diseases that can affect the lungs, such as COPD.   Exercise: -Discuss the importance of exercise, FITT principles of exercise, normal and abnormal responses to exercise, and how to exercise safely.      PULMONARY REHAB OTHER RESPIRATORY from 03/17/2016 in [facility]   Date  03/10/16   Educator  Hart Rochester   Instruction Review Code  2- meets goals/outcomes      Environmental  Irritants: -Discuss types of environmental irritants and how to limit exposure to environmental irritants.      PULMONARY REHAB OTHER RESPIRATORY from 03/17/2016 in [facility]   Date  03/17/16   Educator  DC   Instruction Review Code  2- meets goals/outcomes      Meds/Inhalers and oxygen: - Discuss respiratory medications, definition of an inhaler and oxygen, and the proper way to use an inhaler and oxygen.   Energy Saving Techniques: - Discuss methods to conserve energy and decrease shortness of breath when performing activities of daily living.    Bronchial Hygiene / Breathing Techniques: - Discuss breathing mechanics, pursed-lip breathing technique,  proper posture, effective ways to clear airways, and other functional breathing techniques   Cleaning Equipment: - Provides group verbal and written instruction about the health risks of elevated stress, cause of high stress, and healthy ways to reduce stress.   Nutrition I: Fats: - Discuss the types of cholesterol, what cholesterol does to the body, and how cholesterol levels can be controlled.   Nutrition II: Labels: -Discuss the different components of food labels and how to read food labels.   Respiratory Infections: - Discuss the signs and symptoms of respiratory infections, ways to prevent respiratory infections, and the importance of seeking medical treatment when having a respiratory infection.   Stress I: Signs and Symptoms: - Discuss the causes of stress, how stress may lead to anxiety and depression, and ways to limit stress.   Stress II: Relaxation: -Discuss relaxation techniques to limit stress.  PULMONARY REHAB OTHER RESPIRATORY from 03/17/2016 in [facility]   Date  02/18/16   Educator  Hart Rochesteriane Coad   Instruction Review Code  2- meets goals/outcomes      Oxygen for Home/Travel: - Discuss how to prepare for travel when on oxygen and proper ways to transport  and store oxygen to ensure safety.          [facility]UL[facility]ARY RE[facility] OTHER [facility]   Date  02/25/16   Educator  Hart Rochesteriane Coad   Instruction Review Code  2- meets goals/outcomes      Knowledge Questionnaire Score:     Knowledge Questionnaire Score - 02/09/16 1547    Knowledge Questionnaire Score   Pre Score 10/14      Core Components/Risk Factors/Patient Goals at Admission:     Personal Goals and Risk Factors at Admission - 02/09/16 1548    Core Components/Risk Factors/Patient Goals on Admission    Weight Management Yes   Intervention Weight Management: Provide education and appropriate resources to help participant work on and attain dietary goals.;Weight Management/Obesity: Establish reasonable short term and long term weight goals.   Expected Outcomes Short Term: Continue to assess and modify interventions until short term weight is achieved   Sedentary Yes   Intervention Provide advice, education, support and counseling about physical activity/exercise needs.;Develop an individualized exercise prescription for aerobic and resistive training based on initial evaluation findings, risk stratification, comorbidities and participant's personal goals.   Expected Outcomes Achievement of increased cardiorespiratory fitness and enhanced flexibility, muscular endurance and strength shown through measurements of functional capacity and personal statement of participant.   Tobacco Cessation Yes   Intervention Education officer, environmentalffer self-teaching materials, assist with locating and accessing local/national Quit Smoking programs, and support quit date choice.   Expected Outcomes Short Term: Will demonstrate readiness to quit, by selecting a quit date.   Improve shortness of breath with ADL's Yes   Intervention Provide education, individualized exercise plan and daily activity instruction to help decrease symptoms of SOB with activities of daily living.   Expected  Outcomes Short Term: Achieves a reduction of symptoms when performing activities of daily living.   Develop more efficient breathing techniques such as purse lipped breathing and diaphragmatic breathing; and practicing self-pacing with activity Yes   Intervention Provide education, demonstration and support about specific breathing techniuqes utilized for more efficient breathing. Include techniques such as pursed lipped breathing, diaphragmatic breathing and self-pacing activity.   Expected Outcomes Short Term: Participant will be able to demonstrate and use breathing techniques as needed throughout daily activities.   Stress Yes   Intervention Offer individual and/or small group education and counseling on adjustment to heart disease, stress management and health-related lifestyle change. Teach and support self-help strategies.   Expected Outcomes Long Term: Emotional wellbeing is indicated by absence of clinically significant psychosocial distress or social isolation.;Short Term: Participant demonstrates changes in health-related behavior, relaxation and other stress management skills, ability to obtain effective social support, and compliance with psychotropic medications if prescribed.      Core Components/Risk Factors/Patient Goals Review:      Goals and Risk Factor Review      02/09/16 1552 02/26/16 1444 03/23/16 1453 04/25/16 1127     Core Components/Risk Factors/Patient Goals Review   Personal Goals Review Weight Management/Obesity;Sedentary;Tobacco Cessation;Improve shortness of breath with ADL's;Develop more efficient breathing techniques such as purse lipped breathing and diaphragmatic breathing and practicing self-pacing with activity.;Stress Weight Management/Obesity;Sedentary;Tobacco Cessation;Improve shortness of breath with  ADL's;Develop more efficient breathing techniques such as purse lipped breathing and diaphragmatic breathing and practicing self-pacing with activity.;Stress  Weight Management/Obesity;Sedentary;Tobacco Cessation;Improve shortness of breath with ADL's;Develop more efficient breathing techniques such as purse lipped breathing and diaphragmatic breathing and practicing self-pacing with activity.;Stress Weight Management/Obesity;Sedentary;Tobacco Cessation;Improve shortness of breath with ADL's;Develop more efficient breathing techniques such as purse lipped breathing and diaphragmatic breathing and practicing self-pacing with activity.;Stress    Review  Patient still smoking. has only attended 2 sessions so far in program.  patient has cut down on smoking some.  Progressing well in the program. Patient has lost 5 lbs since starting program.   patient continues to cut down on smoking some.  Progressing well in the program. Patient has lost 5 lbs since starting program.      Expected Outcomes  lose weight, decrease SOB and stress, Stop or slow down smoking lose weight, decrease SOB and stress, Stop or slow down smoking lose weight, decrease SOB and stress, Stop or slow down smoking       Core Components/Risk Factors/Patient Goals at Discharge (Final Review):      Goals and Risk Factor Review - 04/25/16 1127    Core Components/Risk Factors/Patient Goals Review   Personal Goals Review Weight Management/Obesity;Sedentary;Tobacco Cessation;Improve shortness of breath with ADL's;Develop more efficient breathing techniques such as purse lipped breathing and diaphragmatic breathing and practicing self-pacing with activity.;Stress   Review patient continues to cut down on smoking some.  Progressing well in the program. Patient has lost 5 lbs since starting program.     Expected Outcomes lose weight, decrease SOB and stress, Stop or slow down smoking      ITP Comments:     ITP Comments      02/09/16 1629           ITP Comments Patient is a 35 year old male who lives with wife and kids.  Patient states he has strong history of anxiety and depression and  already see counselor for this.  patient very personable and did not show any S/S of anxiety or depression at this time. Will continue to monitor.           Comments:

## 2016-04-25 NOTE — Addendum Note (Signed)
Encounter addended by: Joette CatchingGregory Teyana Pierron on: 04/25/2016 10:49 AM<BR>     Documentation filed: Inpatient Document Flowsheet

## 2016-04-26 ENCOUNTER — Encounter (HOSPITAL_COMMUNITY)
Admission: RE | Admit: 2016-04-26 | Discharge: 2016-04-26 | Disposition: A | Discharge status: Home / Self Care | Payer: Medicare Other | Source: Ambulatory Visit | Attending: Internal Medicine | Admitting: Internal Medicine

## 2016-04-26 DIAGNOSIS — Z87891 Personal history of nicotine dependence: Secondary | ICD-10-CM | POA: Insufficient documentation

## 2016-04-26 DIAGNOSIS — J8410 Pulmonary fibrosis, unspecified: Secondary | ICD-10-CM | POA: Diagnosis not present

## 2016-04-26 DIAGNOSIS — Z79899 Other long term (current) drug therapy: Secondary | ICD-10-CM | POA: Insufficient documentation

## 2016-04-26 DIAGNOSIS — J4540 Moderate persistent asthma, uncomplicated: Secondary | ICD-10-CM | POA: Diagnosis present

## 2016-04-26 DIAGNOSIS — F329 Major depressive disorder, single episode, unspecified: Secondary | ICD-10-CM | POA: Insufficient documentation

## 2016-04-26 DIAGNOSIS — J449 Chronic obstructive pulmonary disease, unspecified: Secondary | ICD-10-CM | POA: Insufficient documentation

## 2016-04-26 NOTE — Progress Notes (Signed)
Daily Session Note  Patient Details  Name: Steven [name] MRN: [id number] Date of Birth: 03/18/1981 Referring Provider:    Encounter Date: 04/26/2016  Check In:     Session Check In - 04/26/16 1330    Check-In   Location [facility]   Staff Present Diane Angelina Pih, MS, EP, [facility], Exercise Physiologist;Younis Mathey Luther Parody, BS, EP, Exercise Physiologist   Supervising physician immediately available to respond to emergencies See telemetry face sheet for immediately available MD   Medication changes reported     No   Fall or balance concerns reported    No   Warm-up and Cool-down Performed as group-led instruction   Resistance Training Performed Yes   VAD Patient? No   Pain Assessment   Currently in Pain? No/denies   Pain Score 0-No pain   Multiple Pain Sites No      Capillary Blood Glucose: No results found for this or any previous visit (from the past 24 hour(s)).   Goals Met:  Independence with exercise equipment Exercise tolerated well No report of cardiac concerns or symptoms Strength training completed today  Goals Unmet:  Not Applicable  Comments: Check out 230   Dr. Kate Sable is Medical Director for [facility].

## 2016-04-28 ENCOUNTER — Encounter (HOSPITAL_COMMUNITY)
Admission: RE | Admit: 2016-04-28 | Discharge: 2016-04-28 | Disposition: A | Discharge status: Home / Self Care | Payer: Medicare Other | Source: Ambulatory Visit | Attending: Internal Medicine | Admitting: Internal Medicine

## 2016-04-28 DIAGNOSIS — J4540 Moderate persistent asthma, uncomplicated: Secondary | ICD-10-CM | POA: Diagnosis not present

## 2016-04-28 NOTE — Progress Notes (Signed)
Daily Session Note  Patient Details  Name: Steven [name] MRN: [id number] Date of Birth: [birth year] Referring Provider:    Encounter Date: 04/28/2016  Check In:     Session Check In - 04/28/16 1330    Check-In   Location [facility]   Staff Present Diane Angelina Pih, MS, EP, [facility], Exercise Physiologist;Rollan Roger Luther Parody, BS, EP, Exercise Physiologist   Supervising physician immediately available to respond to emergencies See telemetry face sheet for immediately available MD   Medication changes reported     No   Fall or balance concerns reported    No   Warm-up and Cool-down Performed as group-led instruction   Resistance Training Performed Yes   VAD Patient? No   Pain Assessment   Currently in Pain? No/denies   Pain Score 0-No pain   Multiple Pain Sites No      Capillary Blood Glucose: No results found for this or any previous visit (from the past 24 hour(s)).      Exercise Prescription Changes - 04/27/16 1500    Exercise Review   Progression No   Response to Exercise   Blood Pressure (Admit) 118/72 mmHg   Blood Pressure (Exercise) 136/74 mmHg   Blood Pressure (Exit) 116/72 mmHg   Heart Rate (Admit) 94 bpm   Heart Rate (Exercise) 110 bpm   Heart Rate (Exit) 95 bpm   Oxygen Saturation (Admit) 96 %   Oxygen Saturation (Exercise) 100 %   Oxygen Saturation (Exit) 95 %   Rating of Perceived Exertion (Exercise) 10   Perceived Dyspnea (Exercise) 10   Symptoms Yes   Comments Patient has retuned from May incident and has decreased in intensity and resistance.    Duration Progress to 30 minutes of continuous aerobic without signs/symptoms of physical distress   Intensity Rest + 20   Progression   Progression Continue to progress workloads to maintain intensity without signs/symptoms of physical distress.   Resistance Training   Training Prescription Yes   Weight 3.0   Reps 10-12   Interval Training   Interval Training No   Treadmill   MPH 1.5   Grade 0   Minutes 20   METs 2.1   NuStep   Level 2   Minutes 15   METs 3.76   Home Exercise Plan   Plans to continue exercise at Home   Frequency Add 2 additional days to program exercise sessions.     Goals Met:  Independence with exercise equipment Exercise tolerated well No report of cardiac concerns or symptoms Strength training completed today  Goals Unmet:  Not Applicable  Comments: Check out 230   Dr. Kate Sable is Medical Director for [facility].

## 2016-05-03 ENCOUNTER — Encounter (HOSPITAL_COMMUNITY): Payer: Medicare Other

## 2016-05-05 ENCOUNTER — Encounter (HOSPITAL_COMMUNITY): Admission: RE | Admit: 2016-05-05 | Payer: Medicare Other | Source: Ambulatory Visit

## 2016-05-10 ENCOUNTER — Encounter (HOSPITAL_COMMUNITY)
Admission: RE | Admit: 2016-05-10 | Payer: Medicare Other | Source: Ambulatory Visit | Attending: Internal Medicine | Admitting: Internal Medicine

## 2016-05-12 ENCOUNTER — Encounter (HOSPITAL_COMMUNITY)
Admission: RE | Admit: 2016-05-12 | Discharge: 2016-05-12 | Disposition: A | Discharge status: Home / Self Care | Payer: Medicare Other | Source: Ambulatory Visit | Attending: Internal Medicine | Admitting: Internal Medicine

## 2016-05-12 DIAGNOSIS — J8410 Pulmonary fibrosis, unspecified: Secondary | ICD-10-CM | POA: Insufficient documentation

## 2016-05-12 DIAGNOSIS — J4540 Moderate persistent asthma, uncomplicated: Secondary | ICD-10-CM | POA: Diagnosis not present

## 2016-05-12 DIAGNOSIS — Z79899 Other long term (current) drug therapy: Secondary | ICD-10-CM | POA: Diagnosis not present

## 2016-05-12 DIAGNOSIS — Z87891 Personal history of nicotine dependence: Secondary | ICD-10-CM | POA: Insufficient documentation

## 2016-05-12 DIAGNOSIS — J449 Chronic obstructive pulmonary disease, unspecified: Secondary | ICD-10-CM | POA: Insufficient documentation

## 2016-05-12 DIAGNOSIS — F329 Major depressive disorder, single episode, unspecified: Secondary | ICD-10-CM | POA: Insufficient documentation

## 2016-05-12 NOTE — Progress Notes (Signed)
Daily Session Note  Patient Details  Name: ALDRIC WENZLER MRN: [id number] Date of Birth: 1980-11-26 Referring Provider:    Encounter Date: 05/12/2016  Check In:     Session Check In - 05/12/16 1322    Check-In   Location [facility]   Staff Present Diane Angelina Pih, MS, EP, [facility], Exercise Physiologist;Jensyn Shave Luther Parody, BS, EP, Exercise Physiologist   Supervising physician immediately available to respond to emergencies See telemetry face sheet for immediately available MD   Medication changes reported     No   Fall or balance concerns reported    No   Warm-up and Cool-down Performed as group-led instruction   Resistance Training Performed Yes   VAD Patient? No   Pain Assessment   Currently in Pain? No/denies   Pain Score 0-No pain   Multiple Pain Sites No      Capillary Blood Glucose: No results found for this or any previous visit (from the past 24 hour(s)).   Goals Met:  Independence with exercise equipment Exercise tolerated well No report of cardiac concerns or symptoms Strength training completed today  Goals Unmet:  Not Applicable  Comments: Check out 230   Dr. Kate Sable is Medical Director for [facility] and [facility].

## 2016-05-17 ENCOUNTER — Encounter (HOSPITAL_COMMUNITY): Payer: Medicare Other

## 2016-05-19 ENCOUNTER — Encounter (HOSPITAL_COMMUNITY): Payer: Medicare Other

## 2016-05-24 ENCOUNTER — Encounter (HOSPITAL_COMMUNITY)
Admission: RE | Admit: 2016-05-24 | Payer: Medicare Other | Source: Ambulatory Visit | Attending: Internal Medicine | Admitting: Internal Medicine

## 2016-05-25 NOTE — Progress Notes (Signed)
Pulmonary Individual Treatment Plan  Patient Details  Name: Steven [name] MRN: [id number] Date of Birth: 09/11/1981 Referring Provider:    Initial Encounter Date:  Flowsheet Row PULMONARY REHAB OTHER RESP ORIENTATION from 02/09/2016 in [facility]  Date  02/09/16      Visit Diagnosis: Asthma, extrinsic, without status asthmaticus, moderate persistent, uncomplicated  Moderate persistent asthma, uncomplicated  Patient's Home Medications on Admission:   Current Outpatient Prescriptions:  .  acetaminophen (TYLENOL) 500 MG tablet, Take 1,000 mg by mouth every 6 (six) hours as needed for moderate pain., Disp: , Rfl:  .  albuterol (PROVENTIL HFA;VENTOLIN HFA) 108 (90 BASE) MCG/ACT inhaler, Inhale 2 puffs into the lungs every 6 (six) hours as needed., Disp: , Rfl:  .  clonazePAM (KLONOPIN) 1 MG tablet, Take 1 mg by mouth 2 (two) times daily as needed., Disp: , Rfl:  .  COMBIVENT RESPIMAT 20-100 MCG/ACT AERS respimat, Inhale 1 puff into the lungs every 6 (six) hours as needed for wheezing. , Disp: , Rfl:  .  EPIPEN 2-PAK 0.3 MG/0.3ML SOAJ injection, , Disp: , Rfl:  .  escitalopram (LEXAPRO) 20 MG tablet, Take 20 mg by mouth daily., Disp: , Rfl:  .  fexofenadine (ALLEGRA) 180 MG tablet, Take 180 mg by mouth daily., Disp: , Rfl:  .  fexofenadine-pseudoephedrine (ALLEGRA-D 24) 180-240 MG 24 hr tablet, Take 1 tablet by mouth daily as needed (will take when allergies get "really bad")., Disp: , Rfl:  .  fluticasone (FLONASE) 50 MCG/ACT nasal spray, Place 1 spray into both nostrils daily., Disp: , Rfl:  .  Fluticasone Furoate-Vilanterol (BREO ELLIPTA) 200-25 MCG/INH AEPB, Inhale 1 puff into the lungs daily., Disp: 60 each, Rfl: 5 .  lisinopril (PRINIVIL,ZESTRIL) 10 MG tablet, Take 5 mg by mouth., Disp: , Rfl:  .  omeprazole (PRILOSEC) 20 MG capsule, Take 20 mg by mouth daily., Disp: , Rfl:  .  predniSONE (DELTASONE) 10 MG tablet, Take 6 tablets (60 mg total) by mouth daily.,  Disp: 30 tablet, Rfl: 0  Past Medical History: Past Medical History:  Diagnosis Date  . Anxiety   . Arthritis    knee  . Asthma   . Chronic mental illness   . COPD (chronic obstructive pulmonary disease) (HCC)   . Depression   . Heart palpitations   . Pulmonary fibrosis (HCC)     Tobacco Use: History  Smoking Status  . Former Smoker  . Packs/day: 0.50  . Years: 2.00  . Types: Cigarettes  . Quit date: 10/31/2000  Smokeless Tobacco  . Current User  . Types: Chew    Comment: 2003    Labs: Recent Review Flowsheet Data    There is no flowsheet data to display.      Capillary Blood Glucose: No results found for: GLUCAP   ADL UCSD:     Pulmonary Assessment Scores    Row Name 02/09/16 1627         ADL UCSD   ADL Phase Entry     SOB Score total 57     Rest 1     Walk 2     Stairs 3     Bath 2     Dress 1     Shop 2       CAT Score   CAT Score 28       mMRC Score   mMRC Score 3        Pulmonary Function Assessment:   Exercise Target Goals:  Exercise Program Goal: Individual exercise prescription set with THRR, safety & activity barriers. Participant demonstrates ability to understand and report RPE using BORG scale, to self-measure pulse accurately, and to acknowledge the importance of the exercise prescription.  Exercise Prescription Goal: Starting with aerobic activity 30 plus minutes a day, 3 days per week for initial exercise prescription. Provide home exercise prescription and guidelines that participant acknowledges understanding prior to discharge.  Activity Barriers & Risk Stratification:     Activity Barriers & Cardiac Risk Stratification - 02/09/16 1547      Activity Barriers & Cardiac Risk Stratification   Activity Barriers None   Cardiac Risk Stratification Low      6 Minute Walk:     6 Minute Walk    Row Name 02/10/16 1333         6 Minute Walk   Phase Initial     Distance 1200 feet     Walk Time 6 minutes     #  of Rest Breaks 0     MPH 2.27     METS 2.74     RPE 11     Perceived Dyspnea  13     VO2 Peak 14.8     Symptoms Yes (comment)     Comments Patient c/o of chest discomfort 2 out of 10 and leg pain 6 out 10 on pain scale.      Resting HR 81 bpm     Resting BP 108/80     Max Ex. HR 107 bpm     Max Ex. BP 148/84     2 Minute Post BP 132/78        Initial Exercise Prescription:     Initial Exercise Prescription - 02/09/16 1445      Date of Initial Exercise RX and Referring Provider   Date 02/09/16     Treadmill   MPH 1.5   Grade 0   Minutes 15   METs 2.15     NuStep   Level 2   Minutes 15   METs 1.5     Prescription Details   Frequency (times per week) 3   Duration Progress to 30 minutes of continuous aerobic without signs/symptoms of physical distress     Intensity   THRR REST +  20   THRR 40-80% of Max Heartrate 124-145   Ratings of Perceived Exertion 11-13   Perceived Dyspnea 0-4     Progression   Progression Continue to progress workloads to maintain intensity without signs/symptoms of physical distress.     Resistance Training   Training Prescription Yes   Weight 1.0   Reps 10-12      Perform Capillary Blood Glucose checks as needed.  Exercise Prescription Changes:      Exercise Prescription Changes    Row Name 02/27/16 2000 03/28/16 1100 04/25/16 1000 04/27/16 1500 05/02/16 1400     Exercise Review   Progression Yes Yes Yes No No     Response to Exercise   Blood Pressure (Admit) 108/70 110/78 120/72 118/72 114/76   Blood Pressure (Exercise) 142/70 132/88 122/72 136/74 142/70   Blood Pressure (Exit) 108/50 118/80 112/80 116/72 120/62   Heart Rate (Admit) 87 bpm 107 bpm 93 bpm 94 bpm 91 bpm   Heart Rate (Exercise) 105 bpm 107 bpm 107 bpm 110 bpm 101 bpm   Heart Rate (Exit) 95 bpm 121 bpm 88 bpm 95 bpm 82 bpm   Oxygen Saturation (Admit)  - 95 % 95 % 96 %  95 %   Oxygen Saturation (Exercise)  - 93 % 93 % 100 % 95 %   Oxygen Saturation (Exit)  -  94 % 94 % 95 % 95 %   Rating of Perceived Exertion (Exercise) 9 10 10 10 10    Perceived Dyspnea (Exercise) 10 10 10 10 10    Symptoms None Yes Yes Yes Yes   Comments  - On 03/22/16 had to stop patient due to chest tightness 6/10 during treadmill with BP of 160/100. Patient rested. BP came down to 118/80 with pain 2/10. When patient left gym to go home he did not have pain. Was instructed to call doctor or go to ED if pain returned.  On 03/22/16 had to stop patient due to chest tightness 6/10 during treadmill with BP of 160/100. Patient rested. BP came down to 118/80 with pain 2/10. When patient left gym to go home he did not have pain. Was instructed to call doctor or go to ED if pain returned.  Patient has retuned from May incident and has decreased in intensity and resistance.  Patient has retuned from May incident and has decreased in intensity and resistance.    Duration Progress to 30 minutes of continuous aerobic without signs/symptoms of physical distress Progress to 30 minutes of continuous aerobic without signs/symptoms of physical distress Progress to 30 minutes of continuous aerobic without signs/symptoms of physical distress Progress to 30 minutes of continuous aerobic without signs/symptoms of physical distress Progress to 30 minutes of continuous aerobic without signs/symptoms of physical distress   Intensity Rest + 20 Rest + 20 Rest + 20 Rest + 20 Rest + 20     Progression   Progression Continue to progress workloads to maintain intensity without signs/symptoms of physical distress. Continue to progress workloads to maintain intensity without signs/symptoms of physical distress. Continue to progress workloads to maintain intensity without signs/symptoms of physical distress. Continue to progress workloads to maintain intensity without signs/symptoms of physical distress. Continue to progress workloads to maintain intensity without signs/symptoms of physical distress.     Resistance Training    Training Prescription Yes Yes Yes Yes Yes   Weight 1.0 3.0 3.0 3.0 2.0   Reps 10-12 10-12 10-12 10-12 10-12     Interval Training   Interval Training No No No No No     Treadmill   MPH 2 2.2 2.3 1.5 1.8   Grade 0 0 0 0 0   Minutes 15 15 15 20 20    METs 2.5 2.68 2.68 2.1 2.3     NuStep   Level 2 2 2 2 2    Minutes 15 15 15 15 15    METs 3.76 3.75 3.75 3.76 3.76     Home Exercise Plan   Plans to continue exercise at Home Home Home Home Home   Frequency Add 2 additional days to program exercise sessions. Add 2 additional days to program exercise sessions. Add 2 additional days to program exercise sessions. Add 2 additional days to program exercise sessions. Add 2 additional days to program exercise sessions.   Row Name 05/19/16 1000             Exercise Review   Progression No         Response to Exercise   Blood Pressure (Admit) 120/62       Blood Pressure (Exercise) 130/74       Blood Pressure (Exit) 120/78       Heart Rate (Admit) 78 bpm  Heart Rate (Exercise) 92 bpm       Heart Rate (Exit) 89 bpm       Oxygen Saturation (Admit) 97 %       Oxygen Saturation (Exercise) 96 %       Oxygen Saturation (Exit) 93 %       Rating of Perceived Exertion (Exercise) 10       Perceived Dyspnea (Exercise) 10       Symptoms Yes       Comments Patient's levels are still lower due to inconsistancy of arriving to sessions.        Duration Progress to 30 minutes of continuous aerobic without signs/symptoms of physical distress       Intensity Rest + 20         Progression   Progression Continue to progress workloads to maintain intensity without signs/symptoms of physical distress.         Resistance Training   Training Prescription Yes       Weight 2.0       Reps 10-12         Interval Training   Interval Training No         Treadmill   MPH 1.8       Grade 0       Minutes 20       METs 2.3         NuStep   Level 2       Minutes 15       METs 3.76         Home  Exercise Plan   Plans to continue exercise at Home       Frequency Add 2 additional days to program exercise sessions.          Exercise Comments:      Exercise Comments    Row Name 02/27/16 2032 03/28/16 1142 05/25/16 0739       Exercise Comments Patient is progressing appropriately.  On 03/22/16 had to stop patient due to chest tightness 6/10 during treadmill with BP of 160/100. Patient rested. BP came down to 118/80 with pain 2/10. When patient left gym to go home he did not have pain. Was instructed to call doctor or go to ED if pain returned.  Patient has returned but is still not consistent so it is hard to progress his workout.         Discharge Exercise Prescription (Final Exercise Prescription Changes):     Exercise Prescription Changes - 05/19/16 1000      Exercise Review   Progression No     Response to Exercise   Blood Pressure (Admit) 120/62   Blood Pressure (Exercise) 130/74   Blood Pressure (Exit) 120/78   Heart Rate (Admit) 78 bpm   Heart Rate (Exercise) 92 bpm   Heart Rate (Exit) 89 bpm   Oxygen Saturation (Admit) 97 %   Oxygen Saturation (Exercise) 96 %   Oxygen Saturation (Exit) 93 %   Rating of Perceived Exertion (Exercise) 10   Perceived Dyspnea (Exercise) 10   Symptoms Yes   Comments Patient's levels are still lower due to inconsistancy of arriving to sessions.    Duration Progress to 30 minutes of continuous aerobic without signs/symptoms of physical distress   Intensity Rest + 20     Progression   Progression Continue to progress workloads to maintain intensity without signs/symptoms of physical distress.     Resistance Training   Training Prescription Yes  Weight 2.0   Reps 10-12     Interval Training   Interval Training No     Treadmill   MPH 1.8   Grade 0   Minutes 20   METs 2.3     NuStep   Level 2   Minutes 15   METs 3.76     Home Exercise Plan   Plans to continue exercise at Home   Frequency Add 2 additional days to  program exercise sessions.      Nutrition:  Target Goals: Understanding of nutrition guidelines, daily intake of sodium 1500mg , cholesterol 200mg , calories 30% from fat and 7% or less from saturated fats, daily to have 5 or more servings of fruits and vegetables.  Biometrics:     Pre Biometrics - 02/10/16 1350      Pre Biometrics   Waist Circumference 46 inches   Hip Circumference 43 inches   Waist to Hip Ratio 1.07 %   Triceps Skinfold 20 mm   % Body Fat 36.4 %   Grip Strength 110 kg   Flexibility 16 in   Single Leg Stand 60 seconds       Nutrition Therapy Plan and Nutrition Goals:     Nutrition Therapy & Goals - 02/09/16 1625      Personal Nutrition Goals   Personal Goal #1 increase dairy   Personal Goal #2 use fruit popsicles or sherbet instead of ice cream      Nutrition Discharge: Rate Your Plate Scores:     Nutrition Assessments - 02/09/16 1548      Rate Your Plate Scores   Pre Score 45      Psychosocial: Target Goals: Acknowledge presence or absence of depression, maximize coping skills, provide positive support system. Participant is able to verbalize types and ability to use techniques and skills needed for reducing stress and depression.  Initial Review & Psychosocial Screening:     Initial Psych Review & Screening - 02/09/16 1616      Initial Review   Current issues with Current Depression;Current Anxiety/Panic     Family Dynamics   Good Support System? Yes   Concerns --  none     Barriers   Psychosocial barriers to participate in program The patient should benefit from training in stress management and relaxation.     Screening Interventions   Interventions Encouraged to exercise      Quality of Life Scores:     Quality of Life - 02/10/16 1352      Quality of Life Scores   Health/Function Pre 20.69 %   Socioeconomic Pre 20 %   Psych/Spiritual Pre 21 %   Family Pre 33 %   GLOBAL Pre 22.44 %      PHQ-9: Recent Review  Flowsheet Data    Depression screen [facility] 2/9 02/09/2016   Decreased Interest 2   Down, Depressed, Hopeless 3   PHQ - 2 Score 5   Altered sleeping 3   Tired, decreased energy 3   Change in appetite 3   Feeling bad or failure about yourself  3   Trouble concentrating 2   Moving slowly or fidgety/restless 3   Suicidal thoughts 1   PHQ-9 Score 23   Difficult doing work/chores Extremely dIfficult      Psychosocial Evaluation and Intervention:     Psychosocial Evaluation - 02/09/16 1618      Psychosocial Evaluation & Interventions   Interventions Stress management education;Relaxation education;Encouraged to exercise with the program and follow exercise prescription  Continued Psychosocial Services Needed No     Discharge Psychosocial Assessment & Intervention   Discharge Continue support measures as needed      Psychosocial Re-Evaluation:     Psychosocial Re-Evaluation    Row Name 02/26/16 1445 03/23/16 1455 04/25/16 1128 05/25/16 0743       Psychosocial Re-Evaluation   Interventions Relaxation education;Encouraged to attend Pulmonary Rehabilitation for the exercise;Stress management education Relaxation education;Encouraged to attend Pulmonary Rehabilitation for the exercise;Stress management education Relaxation education;Encouraged to attend Pulmonary Rehabilitation for the exercise;Stress management education Relaxation education;Encouraged to attend Pulmonary Rehabilitation for the exercise;Stress management education    Comments  -  -  - Will call patient to encourage them to come to class more consistently    Continued Psychosocial Services Needed No No No No       Education: Education Goals: Education classes will be provided on a weekly basis, covering required topics. Participant will state understanding/return demonstration of topics presented.  Learning Barriers/Preferences:     Learning Barriers/Preferences - 02/09/16 1547      Learning Barriers/Preferences    Learning Barriers None   Learning Preferences Skilled Demonstration      Education Topics: How Lungs Work and Diseases: - Discuss the anatomy of the lungs and diseases that can affect the lungs, such as COPD.   Exercise: -Discuss the importance of exercise, FITT principles of exercise, normal and abnormal responses to exercise, and how to exercise safely. Flowsheet Row PULMONARY REHAB OTHER RESPIRATORY from 05/12/2016 in [place] [facility]  Date  03/10/16  Educator  Hart Rochester  Instruction Review Code  2- meets goals/outcomes      Environmental Irritants: -Discuss types of environmental irritants and how to limit exposure to environmental irritants. Flowsheet Row PULMONARY REHAB OTHER RESPIRATORY from 05/12/2016 in [place] [facility]  Date  03/17/16  Educator  DC  Instruction Review Code  2- meets goals/outcomes      Meds/Inhalers and oxygen: - Discuss respiratory medications, definition of an inhaler and oxygen, and the proper way to use an inhaler and oxygen.   Energy Saving Techniques: - Discuss methods to conserve energy and decrease shortness of breath when performing activities of daily living.    Bronchial Hygiene / Breathing Techniques: - Discuss breathing mechanics, pursed-lip breathing technique,  proper posture, effective ways to clear airways, and other functional breathing techniques   Cleaning Equipment: - Provides group verbal and written instruction about the health risks of elevated stress, cause of high stress, and healthy ways to reduce stress.   Nutrition I: Fats: - Discuss the types of cholesterol, what cholesterol does to the body, and how cholesterol levels can be controlled.   Nutrition II: Labels: -Discuss the different components of food labels and how to read food labels. Flowsheet Row PULMONARY REHAB OTHER RESPIRATORY from 05/12/2016 in [place] [facility]  Date  04/28/16  Educator  Hart Rochester   Instruction Review Code  2- meets goals/outcomes      Respiratory Infections: - Discuss the signs and symptoms of respiratory infections, ways to prevent respiratory infections, and the importance of seeking medical treatment when having a respiratory infection.   Stress I: Signs and Symptoms: - Discuss the causes of stress, how stress may lead to anxiety and depression, and ways to limit stress. Flowsheet Row PULMONARY REHAB OTHER RESPIRATORY from 05/12/2016 in [place] [facility]  Date  05/12/16  Educator  DC  Instruction Review Code  2- meets goals/outcomes      Stress  II: Relaxation: -Discuss relaxation techniques to limit stress. Flowsheet Row PULMONARY REHAB OTHER RESPIRATORY from 05/12/2016 in [place] [facility]  Date  02/18/16  Educator  Hart Rochester  Instruction Review Code  2- meets goals/outcomes      Oxygen for Home/Travel: - Discuss how to prepare for travel when on oxygen and proper ways to transport and store oxygen to ensure safety. Flowsheet Row PULMONARY REHAB OTHER RESPIRATORY from 05/12/2016 in [place] [facility]  Date  02/25/16  Educator  Hart Rochester  Instruction Review Code  2- meets goals/outcomes      Knowledge Questionnaire Score:     Knowledge Questionnaire Score - 02/09/16 1547      Knowledge Questionnaire Score   Pre Score 10/14      Core Components/Risk Factors/Patient Goals at Admission:     Personal Goals and Risk Factors at Admission - 02/09/16 1548      Core Components/Risk Factors/Patient Goals on Admission    Weight Management Yes   Intervention Weight Management: Provide education and appropriate resources to help participant work on and attain dietary goals.;Weight Management/Obesity: Establish reasonable short term and long term weight goals.   Expected Outcomes Short Term: Continue to assess and modify interventions until short term weight is achieved   Sedentary Yes   Intervention  Provide advice, education, support and counseling about physical activity/exercise needs.;Develop an individualized exercise prescription for aerobic and resistive training based on initial evaluation findings, risk stratification, comorbidities and participant's personal goals.   Expected Outcomes Achievement of increased cardiorespiratory fitness and enhanced flexibility, muscular endurance and strength shown through measurements of functional capacity and personal statement of participant.   Tobacco Cessation Yes   Intervention Education officer, environmental, assist with locating and accessing local/national Quit Smoking programs, and support quit date choice.   Expected Outcomes Short Term: Will demonstrate readiness to quit, by selecting a quit date.   Improve shortness of breath with ADL's Yes   Intervention Provide education, individualized exercise plan and daily activity instruction to help decrease symptoms of SOB with activities of daily living.   Expected Outcomes Short Term: Achieves a reduction of symptoms when performing activities of daily living.   Develop more efficient breathing techniques such as purse lipped breathing and diaphragmatic breathing; and practicing self-pacing with activity Yes   Intervention Provide education, demonstration and support about specific breathing techniuqes utilized for more efficient breathing. Include techniques such as pursed lipped breathing, diaphragmatic breathing and self-pacing activity.   Expected Outcomes Short Term: Participant will be able to demonstrate and use breathing techniques as needed throughout daily activities.   Stress Yes   Intervention Offer individual and/or small group education and counseling on adjustment to heart disease, stress management and health-related lifestyle change. Teach and support self-help strategies.   Expected Outcomes Long Term: Emotional wellbeing is indicated by absence of clinically significant psychosocial  distress or social isolation.;Short Term: Participant demonstrates changes in health-related behavior, relaxation and other stress management skills, ability to obtain effective social support, and compliance with psychotropic medications if prescribed.      Core Components/Risk Factors/Patient Goals Review:      Goals and Risk Factor Review    Row Name 02/09/16 1552 02/26/16 1444 03/23/16 1453 04/25/16 1127 05/25/16 0742     Core Components/Risk Factors/Patient Goals Review   Personal Goals Review Weight Management/Obesity;Sedentary;Tobacco Cessation;Improve shortness of breath with ADL's;Develop more efficient breathing techniques such as purse lipped breathing and diaphragmatic breathing and practicing self-pacing with activity.;Stress Weight Management/Obesity;Sedentary;Tobacco Cessation;Improve shortness  of breath with ADL's;Develop more efficient breathing techniques such as purse lipped breathing and diaphragmatic breathing and practicing self-pacing with activity.;Stress Weight Management/Obesity;Sedentary;Tobacco Cessation;Improve shortness of breath with ADL's;Develop more efficient breathing techniques such as purse lipped breathing and diaphragmatic breathing and practicing self-pacing with activity.;Stress Weight Management/Obesity;Sedentary;Tobacco Cessation;Improve shortness of breath with ADL's;Develop more efficient breathing techniques such as purse lipped breathing and diaphragmatic breathing and practicing self-pacing with activity.;Stress  -   Review  - Patient still smoking. has only attended 2 sessions so far in program.  patient has cut down on smoking some.  Progressing well in the program. Patient has lost 5 lbs since starting program.   patient continues to cut down on smoking some.  Progressing well in the program. Patient has lost 5 lbs since starting program.   patient continues to cut down on smoking some.  Progressing well in the program. Patient has lost 5 lbs since  starting program.     Expected Outcomes  - lose weight, decrease SOB and stress, Stop or slow down smoking lose weight, decrease SOB and stress, Stop or slow down smoking lose weight, decrease SOB and stress, Stop or slow down smoking lose weight, decrease SOB and stress, Stop or slow down smoking      Core Components/Risk Factors/Patient Goals at Discharge (Final Review):      Goals and Risk Factor Review - 05/25/16 0742      Core Components/Risk Factors/Patient Goals Review   Review patient continues to cut down on smoking some.  Progressing well in the program. Patient has lost 5 lbs since starting program.     Expected Outcomes lose weight, decrease SOB and stress, Stop or slow down smoking      ITP Comments:     ITP Comments    Row Name 02/09/16 1629           ITP Comments Patient is a 35 year old male who lives with wife and kids.  Patient states he has strong history of anxiety and depression and already see counselor for this.  patient very personable and did not show any S/S of anxiety or depression at this time. Will continue to monitor.           Comments: Patient has not been consistent in his attendance so it has been hard to progress his workout. Will encourage him to become more consistent with his attendance.

## 2016-05-26 ENCOUNTER — Encounter (HOSPITAL_COMMUNITY): Payer: Medicare Other

## 2016-05-31 ENCOUNTER — Encounter (HOSPITAL_COMMUNITY): Payer: Medicare Other

## 2016-06-02 ENCOUNTER — Encounter (HOSPITAL_COMMUNITY): Payer: Medicare Other

## 2016-06-07 ENCOUNTER — Encounter (HOSPITAL_COMMUNITY): Payer: Medicare Other

## 2016-06-09 ENCOUNTER — Encounter (HOSPITAL_COMMUNITY): Payer: Medicare Other

## 2016-06-09 NOTE — Addendum Note (Signed)
Encounter addended by: Suann Larryebra L Esma Kilts, RN on: 06/09/2016  9:10 AM<BR>    Actions taken: Visit Navigator Flowsheet section accepted, Sign clinical note, Episode resolved

## 2016-06-09 NOTE — Progress Notes (Signed)
Discharge Summary  Patient Details  Name: Steven [name] MRN: [id number] Date of Birth: [birth year] Referring Provider:     Number of Visits: 12  Reason for Discharge:  Early Exit: Patient was discharged from the program after attending 12 sessions. He was very inconsitent with his attendance and would only attend 2 to 3 sessions after being called followed by missing 3 to 4 sessions.     Smoking History:  History  Smoking Status  . Former Smoker  . Packs/day: 0.50  . Years: 2.00  . Types: Cigarettes  . Quit date: 10/31/2000  Smokeless Tobacco  . Current User  . Types: Chew    Comment: 2003    Diagnosis:  Asthma, extrinsic, without status asthmaticus, moderate persistent, uncomplicated  Moderate persistent asthma, uncomplicated  ADL UCSD:     Pulmonary Assessment Scores    Row Name 02/09/16 1627         ADL UCSD   ADL Phase Entry     SOB Score total 57     Rest 1     Walk 2     Stairs 3     Bath 2     Dress 1     Shop 2       CAT Score   CAT Score 28       mMRC Score   mMRC Score 3        Initial Exercise Prescription:     Initial Exercise Prescription - 02/09/16 1445      Date of Initial Exercise RX and Referring Provider   Date 02/09/16     Treadmill   MPH 1.5   Grade 0   Minutes 15   METs 2.15     NuStep   Level 2   Minutes 15   METs 1.5     Prescription Details   Frequency (times per week) 3   Duration Progress to 30 minutes of continuous aerobic without signs/symptoms of physical distress     Intensity   THRR REST +  20   THRR 40-80% of Max Heartrate 124-145   Ratings of Perceived Exertion 11-13   Perceived Dyspnea 0-4     Progression   Progression Continue to progress workloads to maintain intensity without signs/symptoms of physical distress.     Resistance Training   Training Prescription Yes   Weight 1.0   Reps 10-12      Discharge Exercise Prescription (Final Exercise Prescription Changes):     Exercise  Prescription Changes - 05/19/16 1000      Exercise Review   Progression No     Response to Exercise   Blood Pressure (Admit) 120/62   Blood Pressure (Exercise) 130/74   Blood Pressure (Exit) 120/78   Heart Rate (Admit) 78 bpm   Heart Rate (Exercise) 92 bpm   Heart Rate (Exit) 89 bpm   Oxygen Saturation (Admit) 97 %   Oxygen Saturation (Exercise) 96 %   Oxygen Saturation (Exit) 93 %   Rating of Perceived Exertion (Exercise) 10   Perceived Dyspnea (Exercise) 10   Symptoms Yes   Comments Patient's levels are still lower due to inconsistancy of arriving to sessions.    Duration Progress to 30 minutes of continuous aerobic without signs/symptoms of physical distress   Intensity Rest + 20     Progression   Progression Continue to progress workloads to maintain intensity without signs/symptoms of physical distress.     Resistance Training   Training Prescription Yes   Weight  2.0   Reps 10-12     Interval Training   Interval Training No     Treadmill   MPH 1.8   Grade 0   Minutes 20   METs 2.3     NuStep   Level 2   Minutes 15   METs 3.76     Home Exercise Plan   Plans to continue exercise at Home   Frequency Add 2 additional days to program exercise sessions.      Functional Capacity:     6 Minute Walk    Row Name 02/10/16 1333         6 Minute Walk   Phase Initial     Distance 1200 feet     Walk Time 6 minutes     # of Rest Breaks 0     MPH 2.27     METS 2.74     RPE 11     Perceived Dyspnea  13     VO2 Peak 14.8     Symptoms Yes (comment)     Comments Patient c/o of chest discomfort 2 out of 10 and leg pain 6 out 10 on pain scale.      Resting HR 81 bpm     Resting BP 108/80     Max Ex. HR 107 bpm     Max Ex. BP 148/84     2 Minute Post BP 132/78        Psychological, QOL, Others - Outcomes: PHQ 2/9: Depression screen PHQ 2/9 02/09/2016  Decreased Interest 2  Down, Depressed, Hopeless 3  PHQ - 2 Score 5  Altered sleeping 3  Tired,  decreased energy 3  Change in appetite 3  Feeling bad or failure about yourself  3  Trouble concentrating 2  Moving slowly or fidgety/restless 3  Suicidal thoughts 1  PHQ-9 Score 23  Difficult doing work/chores Extremely dIfficult    Quality of Life:     Quality of Life - 02/10/16 1352      Quality of Life Scores   Health/Function Pre 20.69 %   Socioeconomic Pre 20 %   Psych/Spiritual Pre 21 %   Family Pre 33 %   GLOBAL Pre 22.44 %      Personal Goals: Goals established at orientation with interventions provided to work toward goal.     Personal Goals and Risk Factors at Admission - 02/09/16 1548      Core Components/Risk Factors/Patient Goals on Admission    Weight Management Yes   Intervention Weight Management: Provide education and appropriate resources to help participant work on and attain dietary goals.;Weight Management/Obesity: Establish reasonable short term and long term weight goals.   Expected Outcomes Short Term: Continue to assess and modify interventions until short term weight is achieved   Sedentary Yes   Intervention Provide advice, education, support and counseling about physical activity/exercise needs.;Develop an individualized exercise prescription for aerobic and resistive training based on initial evaluation findings, risk stratification, comorbidities and participant's personal goals.   Expected Outcomes Achievement of increased cardiorespiratory fitness and enhanced flexibility, muscular endurance and strength shown through measurements of functional capacity and personal statement of participant.   Tobacco Cessation Yes   Intervention Education officer, environmentalffer self-teaching materials, assist with locating and accessing local/national Quit Smoking programs, and support quit date choice.   Expected Outcomes Short Term: Will demonstrate readiness to quit, by selecting a quit date.   Improve shortness of breath with ADL's Yes   Intervention Provide education,  individualized  exercise plan and daily activity instruction to help decrease symptoms of SOB with activities of daily living.   Expected Outcomes Short Term: Achieves a reduction of symptoms when performing activities of daily living.   Develop more efficient breathing techniques such as purse lipped breathing and diaphragmatic breathing; and practicing self-pacing with activity Yes   Intervention Provide education, demonstration and support about specific breathing techniuqes utilized for more efficient breathing. Include techniques such as pursed lipped breathing, diaphragmatic breathing and self-pacing activity.   Expected Outcomes Short Term: Participant will be able to demonstrate and use breathing techniques as needed throughout daily activities.   Stress Yes   Intervention Offer individual and/or small group education and counseling on adjustment to heart disease, stress management and health-related lifestyle change. Teach and support self-help strategies.   Expected Outcomes Long Term: Emotional wellbeing is indicated by absence of clinically significant psychosocial distress or social isolation.;Short Term: Participant demonstrates changes in health-related behavior, relaxation and other stress management skills, ability to obtain effective social support, and compliance with psychotropic medications if prescribed.       Personal Goals Discharge:     Goals and Risk Factor Review    Row Name 02/09/16 1552 02/26/16 1444 03/23/16 1453 04/25/16 1127 05/25/16 0742     Core Components/Risk Factors/Patient Goals Review   Personal Goals Review Weight Management/Obesity;Sedentary;Tobacco Cessation;Improve shortness of breath with ADL's;Develop more efficient breathing techniques such as purse lipped breathing and diaphragmatic breathing and practicing self-pacing with activity.;Stress Weight Management/Obesity;Sedentary;Tobacco Cessation;Improve shortness of breath with ADL's;Develop more  efficient breathing techniques such as purse lipped breathing and diaphragmatic breathing and practicing self-pacing with activity.;Stress Weight Management/Obesity;Sedentary;Tobacco Cessation;Improve shortness of breath with ADL's;Develop more efficient breathing techniques such as purse lipped breathing and diaphragmatic breathing and practicing self-pacing with activity.;Stress Weight Management/Obesity;Sedentary;Tobacco Cessation;Improve shortness of breath with ADL's;Develop more efficient breathing techniques such as purse lipped breathing and diaphragmatic breathing and practicing self-pacing with activity.;Stress  -   Review  - Patient still smoking. has only attended 2 sessions so far in program.  patient has cut down on smoking some.  Progressing well in the program. Patient has lost 5 lbs since starting program.   patient continues to cut down on smoking some.  Progressing well in the program. Patient has lost 5 lbs since starting program.   patient continues to cut down on smoking some.  Progressing well in the program. Patient has lost 5 lbs since starting program.     Expected Outcomes  - lose weight, decrease SOB and stress, Stop or slow down smoking lose weight, decrease SOB and stress, Stop or slow down smoking lose weight, decrease SOB and stress, Stop or slow down smoking lose weight, decrease SOB and stress, Stop or slow down smoking      Nutrition & Weight - Outcomes:     Pre Biometrics - 02/10/16 1350      Pre Biometrics   Waist Circumference 46 inches   Hip Circumference 43 inches   Waist to Hip Ratio 1.07 %   Triceps Skinfold 20 mm   % Body Fat 36.4 %   Grip Strength 110 kg   Flexibility 16 in   Single Leg Stand 60 seconds       Nutrition:     Nutrition Therapy & Goals - 02/09/16 1625      Personal Nutrition Goals   Personal Goal #1 increase dairy   Personal Goal #2 use fruit popsicles or sherbet instead of ice cream  Nutrition Discharge:      Nutrition Assessments - 02/09/16 1548      Rate Your Plate Scores   Pre Score 45      Education Questionnaire Score:     Knowledge Questionnaire Score - 02/09/16 1547      Knowledge Questionnaire Score   Pre Score 10/14

## 2016-06-14 ENCOUNTER — Encounter (HOSPITAL_COMMUNITY): Payer: Medicare Other

## 2016-06-16 ENCOUNTER — Encounter (HOSPITAL_COMMUNITY): Payer: Medicare Other

## 2016-06-21 ENCOUNTER — Encounter (HOSPITAL_COMMUNITY): Payer: Medicare Other

## 2016-06-23 ENCOUNTER — Encounter (HOSPITAL_COMMUNITY): Payer: Medicare Other

## 2016-06-28 ENCOUNTER — Encounter (HOSPITAL_COMMUNITY): Payer: Medicare Other

## 2016-07-19 ENCOUNTER — Emergency Department (HOSPITAL_COMMUNITY): Payer: Medicare Other

## 2016-07-19 ENCOUNTER — Encounter (HOSPITAL_COMMUNITY): Payer: Self-pay | Admitting: *Deleted

## 2016-07-19 ENCOUNTER — Emergency Department (HOSPITAL_COMMUNITY)
Admission: EM | Admit: 2016-07-19 | Discharge: 2016-07-19 | Disposition: A | Discharge status: Home / Self Care | Payer: Medicare Other | Attending: Emergency Medicine | Admitting: Emergency Medicine

## 2016-07-19 DIAGNOSIS — R0781 Pleurodynia: Secondary | ICD-10-CM | POA: Insufficient documentation

## 2016-07-19 DIAGNOSIS — Z87891 Personal history of nicotine dependence: Secondary | ICD-10-CM | POA: Insufficient documentation

## 2016-07-19 DIAGNOSIS — J449 Chronic obstructive pulmonary disease, unspecified: Secondary | ICD-10-CM | POA: Diagnosis not present

## 2016-07-19 DIAGNOSIS — R0789 Other chest pain: Secondary | ICD-10-CM | POA: Diagnosis present

## 2016-07-19 DIAGNOSIS — J45909 Unspecified asthma, uncomplicated: Secondary | ICD-10-CM | POA: Diagnosis not present

## 2016-07-19 DIAGNOSIS — E876 Hypokalemia: Secondary | ICD-10-CM | POA: Diagnosis not present

## 2016-07-19 LAB — CBC WITH DIFFERENTIAL/PLATELET
BASOS ABS: 0 10*3/uL (ref 0.0–0.1)
BASOS PCT: 0 %
EOS ABS: 0.4 10*3/uL (ref 0.0–0.7)
EOS PCT: 4 %
HCT: 41.8 % (ref 39.0–52.0)
HEMOGLOBIN: 14.3 g/dL (ref 13.0–17.0)
Lymphocytes Relative: 24 %
Lymphs Abs: 2.3 10*3/uL (ref 0.7–4.0)
MCH: 29.6 pg (ref 26.0–34.0)
MCHC: 34.2 g/dL (ref 30.0–36.0)
MCV: 86.5 fL (ref 78.0–100.0)
Monocytes Absolute: 0.7 10*3/uL (ref 0.1–1.0)
Monocytes Relative: 8 %
NEUTROS PCT: 64 %
Neutro Abs: 6.1 10*3/uL (ref 1.7–7.7)
PLATELETS: 332 10*3/uL (ref 150–400)
RBC: 4.83 MIL/uL (ref 4.22–5.81)
RDW: 12.7 % (ref 11.5–15.5)
WBC: 9.5 10*3/uL (ref 4.0–10.5)

## 2016-07-19 LAB — BASIC METABOLIC PANEL
ANION GAP: 9 (ref 5–15)
BUN: 9 mg/dL (ref 6–20)
CALCIUM: 9.3 mg/dL (ref 8.9–10.3)
CO2: 25 mmol/L (ref 22–32)
CREATININE: 1.04 mg/dL (ref 0.61–1.24)
Chloride: 104 mmol/L (ref 101–111)
Glucose, Bld: 109 mg/dL — ABNORMAL HIGH (ref 65–99)
Potassium: 3.1 mmol/L — ABNORMAL LOW (ref 3.5–5.1)
SODIUM: 138 mmol/L (ref 135–145)

## 2016-07-19 LAB — CBG MONITORING, ED: GLUCOSE-CAPILLARY: 105 mg/dL — AB (ref 65–99)

## 2016-07-19 LAB — TROPONIN I

## 2016-07-19 LAB — D-DIMER, QUANTITATIVE: D-Dimer, Quant: 0.32 ug/mL-FEU (ref 0.00–0.50)

## 2016-07-19 MED ORDER — POTASSIUM CHLORIDE CRYS ER 20 MEQ PO TBCR: 20.0000 meq | EXTENDED_RELEASE_TABLET | Freq: Two times a day (BID) | ORAL | 0 refills | Status: DC

## 2016-07-19 MED ORDER — POTASSIUM CHLORIDE CRYS ER 20 MEQ PO TBCR
40.0000 meq | EXTENDED_RELEASE_TABLET | Freq: Once | ORAL | Status: AC
  Administered 2016-07-19: 40 meq via ORAL
  Filled 2016-07-19: qty 2

## 2016-07-19 MED ORDER — KETOROLAC TROMETHAMINE 30 MG/ML IJ SOLN
30.0000 mg | Freq: Once | INTRAMUSCULAR | Status: DC
  Filled 2016-07-19: qty 1

## 2016-07-19 MED ORDER — IBUPROFEN 800 MG PO TABS
800.0000 mg | ORAL_TABLET | Freq: Once | ORAL | Status: AC
  Administered 2016-07-19: 800 mg via ORAL
  Filled 2016-07-19: qty 1

## 2016-07-19 MED ORDER — NAPROXEN 500 MG PO TABS: 500.0000 mg | ORAL_TABLET | Freq: Two times a day (BID) | ORAL | 0 refills | Status: DC

## 2016-07-19 MED ORDER — ASPIRIN 81 MG PO CHEW
324.0000 mg | CHEWABLE_TABLET | Freq: Once | ORAL | Status: AC
  Administered 2016-07-19: 324 mg via ORAL
  Filled 2016-07-19: qty 4

## 2016-07-19 NOTE — ED Provider Notes (Signed)
[facility]-EMER[facility]ENCY DEPT Provider Note   CSN: [id number] Arrival date & time: 07/19/16  0048     History   Chief Complaint Chief Complaint  Patient presents with  . Chest Pain    HPI Steven [name] is a 35 y.o. male.He has a history of pulmonary fibrosis, COPD, asthma, palpitations. He had onset at about 6 PM of a sharp right lateral chest pain. Pain is worse with movement and worse with breathing. There is associated dyspnea but no cough or nausea or diaphoresis. He has had some cold chills. He has not taken anything for the pain. Currently, pain is rated at 4/10. He has never had pain like this before. He denies history of blood clots, recent surgery, recent travel.  The history is provided by the patient.    Past Medical History:  Diagnosis Date  . Anxiety   . Arthritis    knee  . Asthma   . Chronic mental illness   . COPD (chronic obstructive pulmonary disease) (HCC)   . Depression   . Heart palpitations   . Pulmonary fibrosis [facility])     Patient Active Problem List   Diagnosis Date Noted  . Asthma, chronic 04/05/2015  . Chronic obstructive airway disease with asthma (HCC) 03/17/2015  . Obesity 03/17/2015  . DOE (dyspnea on exertion) 03/17/2015    Past Surgical History:  Procedure Laterality Date  . none         Home Medications    Prior to Admission medications   Medication Sig Start Date End Date Taking? Authorizing Provider  acetaminophen (TYLENOL) 500 MG tablet Take 1,000 mg by mouth every 6 (six) hours as needed for moderate pain.    Historical Provider, MD  albuterol (PROVENTIL HFA;VENTOLIN HFA) 108 (90 BASE) MCG/ACT inhaler Inhale 2 puffs into the lungs every 6 (six) hours as needed.    Historical Provider, MD  clonazePAM (KLONOPIN) 1 MG tablet Take 1 mg by mouth 2 (two) times daily as needed.    Historical Provider, MD  COMBIVENT RESPIMAT 20-100 MCG/ACT AERS respimat Inhale 1 puff into the lungs every 6 (six) hours as needed for wheezing.  01/20/16    Historical Provider, MD  EPIPEN 2-PAK 0.3 MG/0.3ML SOAJ injection  12/03/15   Historical Provider, MD  escitalopram (LEXAPRO) 20 MG tablet Take 20 mg by mouth daily. 02/18/15   Historical Provider, MD  fexofenadine (ALLEGRA) 180 MG tablet Take 180 mg by mouth daily.    Historical Provider, MD  fexofenadine-pseudoephedrine (ALLEGRA-D 24) 180-240 MG 24 hr tablet Take 1 tablet by mouth daily as needed (will take when allergies get "really bad").    Historical Provider, MD  fluticasone (FLONASE) 50 MCG/ACT nasal spray Place 1 spray into both nostrils daily. 06/23/15   Historical Provider, MD  Fluticasone Furoate-Vilanterol (BREO ELLIPTA) 200-25 MCG/INH AEPB Inhale 1 puff into the lungs daily. 12/10/15   Vishal Mungal, MD  lisinopril (PRINIVIL,ZESTRIL) 10 MG tablet Take 5 mg by mouth. 12/03/15 01/02/16  Historical Provider, MD  omeprazole (PRILOSEC) 20 MG capsule Take 20 mg by mouth daily.    Historical Provider, MD  predniSONE (DELTASONE) 10 MG tablet Take 6 tablets (60 mg total) by mouth daily. 03/28/16   [name], MD    Family History Family History  Problem Relation Age of Onset  . COPD Mother   . Asthma Mother   . Mental illness Mother   . COPD Father   . Pulmonary fibrosis Father   . Lung cancer Father  Social History Social History  Substance Use Topics  . Smoking status: Former Smoker    Packs/day: 0.50    Years: 2.00    Types: Cigarettes    Quit date: 10/31/2000  . Smokeless tobacco: Current User    Types: Chew     Comment: 2003  . Alcohol use No     Allergies   Diphenhydramine; Guaifenesin; and Amoxicillin   Review of Systems Review of Systems  All other systems reviewed and are negative.    Physical Exam Updated Vital Signs BP (!) 104/52   Pulse 103   Temp 98.9 F (37.2 C) (Oral)   Resp 22   SpO2 94%   Physical Exam  Nursing note and vitals reviewed.  Obese 35 year old male, resting comfortably and in no acute distress. Vital signs are significant for  tachypnea and borderline tachycardia. Oxygen saturation is 94%, which is normal. Head is normocephalic and atraumatic. PERRLA, EOMI. Oropharynx is clear. Neck is nontender and supple without adenopathy or JVD. Back is nontender and there is no CVA tenderness. Lungs are clear without rales, wheezes, or rhonchi. Chest is nontender. Heart has regular rate and rhythm without murmur. Abdomen is soft, flat, nontender without masses or hepatosplenomegaly and peristalsis is normoactive. Extremities have 1+ edema, full range of motion is present. Skin is warm and dry without rash. Neurologic: Mental status is normal, cranial nerves are intact, there are no motor or sensory deficits.  ED Treatments / Results  Labs (all labs ordered are listed, but only abnormal results are displayed) Labs Reviewed  BASIC METABOLIC PANEL - Abnormal; Notable for the following:       Result Value   Potassium 3.1 (*)    Glucose, Bld 109 (*)    All other components within normal limits  CBG MONITORING, ED - Abnormal; Notable for the following:    Glucose-Capillary 105 (*)    All other components within normal limits  TROPONIN I  CBC WITH DIFFERENTIAL/PLATELET  D-DIMER, QUANTITATIVE ([facility])    EKG  EKG Interpretation  Date/Time:  Tuesday July 19 2016 01:01:11 EDT Ventricular Rate:  127 PR Interval:    QRS Duration: 98 QT Interval:  318 QTC Calculation: 463 R Axis:   68 Text Interpretation:  Sinus tachycardia Baseline wander in lead(s) V3 Otherwise within normal limits When compared with ECG of 03/02/2013, Premature ventricular complexes are no longer Present Confirmed by Preston FleetingGLICK  MD, Bannie Lobban ([id number]) on 07/19/2016 1:04:20 AM       Radiology Dg Chest 2 View  Result Date: 07/19/2016 CLINICAL DATA:  Evaluation for worsening right-sided chest pain. History pulmonary fibrosis, COPD, asthma. EXAM: CHEST  2 VIEW COMPARISON:  Prior radiograph from 03/28/2016. FINDINGS: Cardiac mediastinal silhouettes are  stable in size and contour, and remain within normal limits. Lungs are mildly hypoinflated, stable from prior. Minimal left basilar atelectasis/ scarring. No focal infiltrates. No pulmonary edema or pleural effusion. No pneumothorax. No acute osseous abnormality. IMPRESSION: 1. Mild left basilar atelectasis/scarring, similar to prior. 2. No other active cardiopulmonary disease. Electronically Signed   By: Rise MuBenjamin  McClintock M.D.   On: 07/19/2016 01:50    Procedures Procedures (including critical care time)  Medications Ordered in ED Medications - No data to display   Initial Impression / Assessment and Plan / ED Course  I have reviewed the triage vital signs and the nursing notes.  Pertinent labs & imaging results that were available during my care of the patient were reviewed by me and considered in  my medical decision making (see chart for details).  Clinical Course   35 year old male with pleuritic chest pain and tachycardia. We'll check chest x-ray 12 pneumonia, d-dimer to rule out pulmonary embolism. He'll be given a dose of ketorolac. Old records are reviewed, and he has been evaluated by cardiology for chest pain in the past and has had negative stress tests.  D-dimer is come back negative and chest x-ray is unremarkable. Only significant laboratory finding is potassium at 3.1. Is given a dose of oral potassium. He refused ketorolac. Diagnosis at this point appears to be a viral pleurisy. He did agree to take a dose of ibuprofen in the ED and he is discharged with prescription for naproxen as well as K Dur. Follow-up with PCP.  Final Clinical Impressions(s) / ED Diagnoses   Final diagnoses:  Pleuritic chest pain  Hypokalemia    New Prescriptions New Prescriptions   NAPROXEN (NAPROSYN) 500 MG TABLET    Take 1 tablet (500 mg total) by mouth 2 (two) times daily.   POTASSIUM CHLORIDE SA (K-DUR,KLOR-CON) 20 MEQ TABLET    Take 1 tablet (20 mEq total) by mouth 2 (two) times daily.      Dione Booze, MD 07/19/16 [phone]

## 2016-07-19 NOTE — ED Triage Notes (Signed)
Pt c/o right side chest pain that started while he was at [employer] and the pain has gotten progressively worse

## 2016-09-13 ENCOUNTER — Other Ambulatory Visit: Payer: Self-pay | Admitting: Internal Medicine

## 2016-09-15 ENCOUNTER — Other Ambulatory Visit: Payer: Self-pay | Admitting: Internal Medicine

## 2017-07-07 ENCOUNTER — Encounter (HOSPITAL_COMMUNITY): Payer: Self-pay | Admitting: Emergency Medicine

## 2017-07-07 ENCOUNTER — Emergency Department (HOSPITAL_COMMUNITY)
Admission: EM | Admit: 2017-07-07 | Discharge: 2017-07-08 | Disposition: A | Discharge status: Home / Self Care | Payer: Medicare Other | Attending: Emergency Medicine | Admitting: Emergency Medicine

## 2017-07-07 ENCOUNTER — Emergency Department (HOSPITAL_COMMUNITY): Payer: Medicare Other

## 2017-07-07 DIAGNOSIS — Z79899 Other long term (current) drug therapy: Secondary | ICD-10-CM | POA: Insufficient documentation

## 2017-07-07 DIAGNOSIS — J441 Chronic obstructive pulmonary disease with (acute) exacerbation: Secondary | ICD-10-CM | POA: Diagnosis not present

## 2017-07-07 DIAGNOSIS — J45909 Unspecified asthma, uncomplicated: Secondary | ICD-10-CM | POA: Insufficient documentation

## 2017-07-07 DIAGNOSIS — R05 Cough: Secondary | ICD-10-CM

## 2017-07-07 DIAGNOSIS — Z87891 Personal history of nicotine dependence: Secondary | ICD-10-CM | POA: Insufficient documentation

## 2017-07-07 DIAGNOSIS — R059 Cough, unspecified: Secondary | ICD-10-CM

## 2017-07-07 DIAGNOSIS — R0602 Shortness of breath: Secondary | ICD-10-CM | POA: Diagnosis not present

## 2017-07-07 LAB — CBC WITH DIFFERENTIAL/PLATELET
Basophils Absolute: 0 10*3/uL (ref 0.0–0.1)
Basophils Relative: 0 %
Eosinophils Absolute: 0.3 10*3/uL (ref 0.0–0.7)
Eosinophils Relative: 4 %
HCT: 43.5 % (ref 39.0–52.0)
Hemoglobin: 14.9 g/dL (ref 13.0–17.0)
LYMPHS PCT: 39 %
Lymphs Abs: 3 10*3/uL (ref 0.7–4.0)
MCH: 29.7 pg (ref 26.0–34.0)
MCHC: 34.3 g/dL (ref 30.0–36.0)
MCV: 86.7 fL (ref 78.0–100.0)
MONO ABS: 0.5 10*3/uL (ref 0.1–1.0)
MONOS PCT: 6 %
NEUTROS ABS: 3.9 10*3/uL (ref 1.7–7.7)
Neutrophils Relative %: 51 %
Platelets: 329 10*3/uL (ref 150–400)
RBC: 5.02 MIL/uL (ref 4.22–5.81)
RDW: 12.9 % (ref 11.5–15.5)
WBC: 7.7 10*3/uL (ref 4.0–10.5)

## 2017-07-07 LAB — BASIC METABOLIC PANEL
Anion gap: 8 (ref 5–15)
BUN: 7 mg/dL (ref 6–20)
CALCIUM: 8.9 mg/dL (ref 8.9–10.3)
CO2: 25 mmol/L (ref 22–32)
CREATININE: 0.92 mg/dL (ref 0.61–1.24)
Chloride: 105 mmol/L (ref 101–111)
GFR calc Af Amer: >60 mL/min (ref >60)
Glucose, Bld: 115 mg/dL — ABNORMAL HIGH (ref 65–99)
Potassium: 3.6 mmol/L (ref 3.5–5.1)
SODIUM: 138 mmol/L (ref 135–145)

## 2017-07-07 MED ORDER — ALBUTEROL SULFATE (2.5 MG/3ML) 0.083% IN NEBU
2.5000 mg | INHALATION_SOLUTION | Freq: Once | RESPIRATORY_TRACT | Status: AC
  Administered 2017-07-07: 2.5 mg via RESPIRATORY_TRACT
  Filled 2017-07-07: qty 3

## 2017-07-07 NOTE — ED Provider Notes (Signed)
[facility]EM[facility]Y DEPT Provider Note   CSN: [id number] Arrival date & time: 07/07/17  1945     History   Chief Complaint Chief Complaint  Patient presents with  . Cough    HPI Steven [name] is a 36 y.o. male past medical history of anxiety, COPD, pulmonary fibrosis who presents with 3 days of progressively worsening shortness of breath. Patient reports that shortness of breath is worse with deep inspiration and exertion. He states that he has been using his DuoNeb breathing treatment at home with no improvement in symptoms. He states that he has also been using his inhalers at home with minimal improvement. Patient also reports a dry cough and intermittent chest tightness. No chest pain. Patient states that he sometimes gets these symptoms with asthma exacerbation. He states that taken any other medication for symptoms. Patient does not feel like he is wheezing. She does have history of asthma and COPD. Patient also has a history of pulmonary fibrosis. Patient states that he is on 2 L of home O2 at home due to low oxygen levels while sleeping. He has not had to increase the usage. Patient denies any fever, chills, chest pain, abdominal pain, nausea/vomiting, leg swelling. He denies any hormone use, recent immobilization, prior history of DVT/PE, recent surgery, leg swelling, or long travel.  The history is provided by the patient.    Past Medical History:  Diagnosis Date  . Anxiety   . Arthritis    knee  . Asthma   . Chronic mental illness   . COPD (chronic obstructive pulmonary disease) (HCC)   . Depression   . Heart palpitations   . Pulmonary fibrosis [facility])     Patient Active Problem List   Diagnosis Date Noted  . Asthma, chronic 04/05/2015  . Chronic obstructive airway disease with asthma (HCC) 03/17/2015  . Obesity 03/17/2015  . DOE (dyspnea on exertion) 03/17/2015    Past Surgical History:  Procedure Laterality Date  . none         Home Medications    Prior to  Admission medications   Medication Sig Start Date End Date Taking? Authorizing Provider  acetaminophen (TYLENOL) 500 MG tablet Take 1,000 mg by mouth every 6 (six) hours as needed for moderate pain.    [provider]  azithromycin (ZITHROMAX) 250 MG tablet Take 1 tablet (250 mg total) by mouth daily. Take first 2 tablets together, then 1 every day until finished. 07/08/17   Graciella Freer A, PA-C  BREO ELLIPTA 200-25 MCG/INH AEPB INHALE 1 PUFF INTO THE LUNGS DAILY 09/13/16   Mungal, Vishal, MD  clonazePAM (KLONOPIN) 1 MG tablet Take 1 mg by mouth 3 (three) times daily as needed.     [provider]  COMBIVENT RESPIMAT 20-100 MCG/ACT AERS respimat Inhale 1 puff into the lungs every 6 (six) hours as needed for wheezing.  01/20/16   [provider]  EPIPEN 2-PAK 0.3 MG/0.3ML SOAJ injection  12/03/15   [provider]  escitalopram (LEXAPRO) 20 MG tablet Take 20 mg by mouth daily. 02/18/15   [provider]  fexofenadine (ALLEGRA) 180 MG tablet Take 180 mg by mouth daily.    [provider]  fexofenadine-pseudoephedrine (ALLEGRA-D 24) 180-240 MG 24 hr tablet Take 1 tablet by mouth daily as needed (will take when allergies get "really bad").    [provider]  fluticasone (FLONASE) 50 MCG/ACT nasal spray Place 1 spray into both nostrils daily. 06/23/15   [provider]  lisinopril (PRINIVIL,ZESTRIL)  10 MG tablet Take 5 mg by mouth. 12/03/15 01/02/16  [provider]  naproxen (NAPROSYN) 500 MG tablet Take 1 tablet (500 mg total) by mouth 2 (two) times daily. 07/19/16   Dione BoozeGlick, David, MD  omeprazole (PRILOSEC) 20 MG capsule Take 20 mg by mouth daily.    [provider]  potassium chloride SA (K-DUR,KLOR-CON) 20 MEQ tablet Take 1 tablet (20 mEq total) by mouth 2 (two) times daily. 07/19/16   Dione BoozeGlick, David, MD  predniSONE (DELTASONE) 10 MG tablet Take 2 tablets (20 mg total) by mouth daily. 07/08/17   Maxwell CaulLayden, Elwood Bazinet A, PA-C    PROAIR HFA 108 (90 Base) MCG/ACT inhaler INAHLE 2 PUFFS BY MOUTH EVERY 4 TO 6 HOURS AS NEEDED 09/15/16   Stephanie AcreMungal, Vishal, MD    Family History Family History  Problem Relation Age of Onset  . COPD Mother   . Asthma Mother   . Mental illness Mother   . COPD Father   . Pulmonary fibrosis Father   . Lung cancer Father     Social History Social History  Substance Use Topics  . Smoking status: Former Smoker    Packs/day: 0.50    Years: 2.00    Types: Cigarettes    Quit date: 10/31/2000  . Smokeless tobacco: Current User    Types: Chew     Comment: 2003  . Alcohol use No     Allergies   Diphenhydramine; Guaifenesin; and Amoxicillin   Review of Systems Review of Systems  Constitutional: Negative for chills and fever.  HENT: Positive for rhinorrhea. Negative for congestion and sore throat.   Respiratory: Positive for cough, chest tightness and shortness of breath.   Cardiovascular: Negative for chest pain.  Gastrointestinal: Negative for abdominal pain, diarrhea, nausea and vomiting.  Genitourinary: Negative for dysuria and hematuria.  Neurological: Negative for dizziness, weakness and numbness.     Physical Exam Updated Vital Signs BP 131/70   Pulse 73   Temp 98 F (36.7 C)   Resp 17   Ht 5\' 7"  (1.702 m)   Wt 115.7 kg (255 lb)   SpO2 97%   BMI 39.94 kg/m   Physical Exam  Constitutional: He is oriented to person, place, and time. He appears well-developed and well-nourished.  Sitting comfortably on examination table  HENT:  Head: Normocephalic and atraumatic.  Mouth/Throat: Oropharynx is clear and moist and mucous membranes are normal.  Eyes: Pupils are equal, round, and reactive to light. Conjunctivae, EOM and lids are normal.  Neck: Full passive range of motion without pain.  Cardiovascular: Normal rate, regular rhythm, normal heart sounds and normal pulses.  Exam reveals no gallop and no friction rub.   No murmur heard. Pulmonary/Chest: Effort normal and  breath sounds normal. He has no wheezes. He has no rales.  No evidence of respiratory distress. Able to speak in full sentences without difficulty.  Abdominal: Soft. Normal appearance. There is no tenderness. There is no rigidity and no guarding.  Musculoskeletal: Normal range of motion.  Bilateral lower extremities are symmetric in appearance with no edema.   Neurological: He is alert and oriented to person, place, and time.  Skin: Skin is warm and dry. Capillary refill takes less than 2 seconds.  Psychiatric: He has a normal mood and affect. His speech is normal.  Nursing note and vitals reviewed.    ED Treatments / Results  Labs (all labs ordered are listed, but only abnormal results are displayed) Labs Reviewed  BASIC METABOLIC PANEL - Abnormal;  Notable for the following:       Result Value   Glucose, Bld 115 (*)    All other components within normal limits  CBC WITH DIFFERENTIAL/PLATELET  D-DIMER, QUANTITATIVE (NOT AT [facility])    EKG  EKG Interpretation None       Radiology Dg Chest 2 View  Result Date: 07/07/2017 CLINICAL DATA:  Initial evaluation for acute cough. EXAM: CHEST  2 VIEW COMPARISON:  Prior radiograph from 07/19/2016. FINDINGS: Cardiac and mediastinal silhouettes are stable in size and contour, and remain within normal limits. Lungs mildly hypoinflated. Mild bibasilar scarring, similar to previous. No focal infiltrates to suggest pneumonia. No pulmonary edema or pleural effusion. No pneumothorax. No acute osseus abnormality. IMPRESSION: 1. Mild bibasilar scarring, similar to previous. 2. No other active cardiopulmonary disease. Electronically Signed   By: Rise Mu M.D.   On: 07/07/2017 20:15    Procedures Procedures (including critical care time)  Medications Ordered in ED Medications  albuterol (PROVENTIL) (2.5 MG/3ML) 0.083% nebulizer solution 2.5 mg (2.5 mg Nebulization Given 07/07/17 2254)     Initial Impression / Assessment and Plan / ED  Course  I have reviewed the triage vital signs and the nursing notes.  Pertinent labs & imaging results that were available during my care of the patient were reviewed by me and considered in my medical decision making (see chart for details).     36 y.o. M with PMH/o asthma, COPD, pulmonary fibrosis who presents with shortness of breath, dry cough 3 days. Drums (worse with exertion and deep inspiration. No improvement with home breathing treatment. Patient is afebrile, non-toxic appearing, sitting comfortably on examination table. Vital signs reviewed and stable. Patient's O2 sat is greater than 95% on room air. Consider chronic lung disease versus COPD exacerbation versus asthma exacerbation versus acute infectious etiology. Initial CXR ordered at triage. Plan to give repeat breathing treatment in the emergency department. Will check basic labs in clinic CBC, BMP, d-dimer.   Chest x-ray reviewed. It shows bibasilar scarring which is consistent with previous. Otherwise no abnormalities. BMP is unremarkable. CBC is unremarkable. D-Dimer pending.  Planned in delay patient in the emergency department with O2 sat monitoring.  Patient ambulated in department. Initial O2 sat was 98% on room air. When he returned, his O2 sat was 90% on RA.  D-Dimer is negative. Discussed result with patient. Patient denying any symptoms at rest. He is denying any chest pain. He reports that when he starts walking around he feels like he is having shortness of breath. I ambulated the patient around the emergency department myself. He display no evidence of respiratory distress, no tachypnea. His initial O2 sat before walking was 98% on room air. Will return, his O2 sat was 97% on room air.  EKG shows normal sinus rhythm, rate 76. No ST elevations  Discussed results with patient. O2 sat has remained good at 95% on room air. Will plan to treat this as a COPD/asthma exacerbation. Will plan to give symptomatically treatment  at home. Patient instructed to follow up with his pulmonologist. Strict return precautions discussed. Patient expresses understanding and agreement to plan.    Final Clinical Impressions(s) / ED Diagnoses   Final diagnoses:  COPD exacerbation (HCC)  SOB (shortness of breath)  Cough    New Prescriptions New Prescriptions   AZITHROMYCIN (ZITHROMAX) 250 MG TABLET    Take 1 tablet (250 mg total) by mouth daily. Take first 2 tablets together, then 1 every day until finished.   PREDNISONE (  DELTASONE) 10 MG TABLET    Take 2 tablets (20 mg total) by mouth daily.     Maxwell Caul, PA-C 07/10/17 2102    Marily Memos, MD 07/12/17 [phone]

## 2017-07-07 NOTE — ED Triage Notes (Signed)
Pt c/o cough, runny nose and sob x3 days.

## 2017-07-08 ENCOUNTER — Other Ambulatory Visit: Payer: Self-pay

## 2017-07-08 LAB — D-DIMER, QUANTITATIVE (NOT AT ARMC)

## 2017-07-08 MED ORDER — AZITHROMYCIN 250 MG PO TABS: 250.0000 mg | ORAL_TABLET | Freq: Every day | ORAL | 0 refills | Status: DC

## 2017-07-08 MED ORDER — PREDNISONE 10 MG PO TABS: 20.0000 mg | ORAL_TABLET | Freq: Every day | ORAL | 0 refills | Status: DC

## 2017-07-08 NOTE — Discharge Instructions (Signed)
Use her inhalers as directed.  Take the prednisone as directed.  Take the azithromycin as directed.  Follow-up with her primary care doctor next 24-40 or further evaluation.  Follow-up with your pulmonologist in the next few days.  Return the emergency Department for any worsening pain, difficulty breathing, fever, vomiting or any other worsening or concerning symptoms.

## 2017-07-08 NOTE — ED Notes (Signed)
Ambulated patient around nursing station oxygen level started at 98%on return to room oxygen level was 90%

## 2017-09-04 ENCOUNTER — Encounter: Payer: Self-pay | Admitting: *Deleted

## 2017-09-05 ENCOUNTER — Ambulatory Visit: Payer: Medicare Other | Admitting: Anesthesiology

## 2017-09-05 ENCOUNTER — Encounter: Payer: Self-pay | Admitting: *Deleted

## 2017-09-05 ENCOUNTER — Ambulatory Visit
Admission: RE | Admit: 2017-09-05 | Discharge: 2017-09-05 | Disposition: A | Discharge status: Home / Self Care | Payer: Medicare Other | Source: Ambulatory Visit | Attending: Internal Medicine | Admitting: Internal Medicine

## 2017-09-05 ENCOUNTER — Encounter
Admission: RE | Disposition: A | Discharge status: Home / Self Care | Payer: Self-pay | Source: Ambulatory Visit | Attending: Internal Medicine

## 2017-09-05 DIAGNOSIS — K224 Dyskinesia of esophagus: Secondary | ICD-10-CM | POA: Diagnosis not present

## 2017-09-05 DIAGNOSIS — Z87891 Personal history of nicotine dependence: Secondary | ICD-10-CM | POA: Diagnosis not present

## 2017-09-05 DIAGNOSIS — R1310 Dysphagia, unspecified: Secondary | ICD-10-CM | POA: Diagnosis present

## 2017-09-05 DIAGNOSIS — Z7952 Long term (current) use of systemic steroids: Secondary | ICD-10-CM | POA: Diagnosis not present

## 2017-09-05 DIAGNOSIS — Z6841 Body Mass Index (BMI) 40.0 and over, adult: Secondary | ICD-10-CM | POA: Diagnosis not present

## 2017-09-05 DIAGNOSIS — J449 Chronic obstructive pulmonary disease, unspecified: Secondary | ICD-10-CM | POA: Diagnosis not present

## 2017-09-05 DIAGNOSIS — F419 Anxiety disorder, unspecified: Secondary | ICD-10-CM | POA: Insufficient documentation

## 2017-09-05 DIAGNOSIS — K219 Gastro-esophageal reflux disease without esophagitis: Secondary | ICD-10-CM | POA: Diagnosis not present

## 2017-09-05 DIAGNOSIS — Z7951 Long term (current) use of inhaled steroids: Secondary | ICD-10-CM | POA: Insufficient documentation

## 2017-09-05 DIAGNOSIS — Z79899 Other long term (current) drug therapy: Secondary | ICD-10-CM | POA: Diagnosis not present

## 2017-09-05 DIAGNOSIS — F329 Major depressive disorder, single episode, unspecified: Secondary | ICD-10-CM | POA: Insufficient documentation

## 2017-09-05 HISTORY — DX: Gastro-esophageal reflux disease without esophagitis: K21.9

## 2017-09-05 HISTORY — PX: ESOPHAGOGASTRODUODENOSCOPY (EGD) WITH PROPOFOL: SHX5813

## 2017-09-05 SURGERY — ESOPHAGOGASTRODUODENOSCOPY (EGD) WITH PROPOFOL
Anesthesia: General

## 2017-09-05 MED ORDER — PROPOFOL 10 MG/ML IV BOLUS
INTRAVENOUS | Status: DC | PRN
  Administered 2017-09-05: 100 mg via INTRAVENOUS
  Administered 2017-09-05: 50 mg via INTRAVENOUS

## 2017-09-05 MED ORDER — SODIUM CHLORIDE 0.9 % IV SOLN
INTRAVENOUS | Status: DC
  Administered 2017-09-05: 1000 mL via INTRAVENOUS
  Administered 2017-09-05: 14:00:00 via INTRAVENOUS

## 2017-09-05 MED ORDER — PROPOFOL 500 MG/50ML IV EMUL
INTRAVENOUS | Status: AC
  Filled 2017-09-05: qty 50

## 2017-09-05 MED ORDER — FENTANYL CITRATE (PF) 100 MCG/2ML IJ SOLN
INTRAMUSCULAR | Status: AC
  Filled 2017-09-05: qty 2

## 2017-09-05 MED ORDER — LIDOCAINE 2% (20 MG/ML) 5 ML SYRINGE
INTRAMUSCULAR | Status: DC | PRN
  Administered 2017-09-05: 40 mg via INTRAVENOUS

## 2017-09-05 MED ORDER — PROPOFOL 500 MG/50ML IV EMUL
INTRAVENOUS | Status: DC | PRN
Start: 2017-09-05 — End: 2017-09-05
  Administered 2017-09-05: 180 ug/kg/min via INTRAVENOUS

## 2017-09-05 MED ORDER — FENTANYL CITRATE (PF) 100 MCG/2ML IJ SOLN
INTRAMUSCULAR | Status: DC | PRN
  Administered 2017-09-05 (×2): 50 ug via INTRAVENOUS

## 2017-09-05 NOTE — Anesthesia Preprocedure Evaluation (Signed)
Anesthesia Evaluation  Patient identified by MRN, date of birth, ID band Patient awake    Reviewed: Allergy & Precautions, NPO status   Airway Mallampati: II       Dental  (+) Teeth Intact   Pulmonary asthma , COPD,  COPD inhaler, former smoker,     + decreased breath sounds      Cardiovascular Exercise Tolerance: Good + DOE   Rhythm:Regular Rate:Normal     Neuro/Psych Anxiety Depression    GI/Hepatic Neg liver ROS, GERD  Medicated,  Endo/Other  Morbid obesity  Renal/GU negative Renal ROS     Musculoskeletal   Abdominal (+) + obese,   Peds negative pediatric ROS (+)  Hematology negative hematology ROS (+)   Anesthesia Other Findings   Reproductive/Obstetrics                             Anesthesia Physical Anesthesia Plan  ASA: III  Anesthesia Plan: General   Post-op Pain Management:    Induction: Intravenous  PONV Risk Score and Plan: 2 and 0  Airway Management Planned: Natural Airway and Nasal Cannula  Additional Equipment:   Intra-op Plan:   Post-operative Plan:   Informed Consent: I have reviewed the patients History and Physical, chart, labs and discussed the procedure including the risks, benefits and alternatives for the proposed anesthesia with the patient or authorized representative who has indicated his/her understanding and acceptance.     Plan Discussed with: CRNA  Anesthesia Plan Comments:         Anesthesia Quick Evaluation

## 2017-09-05 NOTE — Anesthesia Postprocedure Evaluation (Signed)
Anesthesia Post Note  Patient: Steven PomfretRandy L [name]  Procedure(s) Performed: ESOPHAGOGASTRODUODENOSCOPY (EGD) WITH PROPOFOL (N/A )  Patient location during evaluation: PACU Anesthesia Type: General Level of consciousness: awake Pain management: pain level controlled Vital Signs Assessment: post-procedure vital signs reviewed and stable Respiratory status: spontaneous breathing Cardiovascular status: stable Anesthetic complications: no     Last Vitals:  Vitals:   09/05/17 1422 09/05/17 1431  BP: 136/74 100/63  Pulse: (!) 113 87  Resp: (!) 22 14  Temp:    SpO2: 95% 97%    Last Pain:  Vitals:   09/05/17 1335  TempSrc: Tympanic                 VAN STAVEREN,Steven [name]

## 2017-09-05 NOTE — Transfer of Care (Signed)
Immediate Anesthesia Transfer of Care Note  Patient: Steven PomfretRandy L [name]  Procedure(s) Performed: ESOPHAGOGASTRODUODENOSCOPY (EGD) WITH PROPOFOL (N/A )  Patient Location: PACU and Endoscopy Unit  Anesthesia Type:General  Level of Consciousness: awake, drowsy and patient cooperative  Airway & Oxygen Therapy: Patient Spontanous Breathing and Patient connected to nasal cannula oxygen  Post-op Assessment: Report given to RN and Post -op Vital signs reviewed and stable  Post vital signs: Reviewed and stable  Last Vitals:  Vitals:   09/05/17 1335  BP: (!) 144/75  Pulse: 100  Resp: 20  Temp: 36.9 C  SpO2: 100%    Last Pain:  Vitals:   09/05/17 1335  TempSrc: Tympanic      Patients Stated Pain Goal: 0 (09/05/17 1335)  Complications: No apparent anesthesia complications

## 2017-09-05 NOTE — Anesthesia Post-op Follow-up Note (Signed)
Anesthesia QCDR form completed.        

## 2017-09-05 NOTE — Op Note (Signed)
[facility] Gastroenterology Patient Name: Steven PeeRandy Schipani Procedure Date: 09/05/2017 1:57 PM MRN: [id number] Account #: [id number] Date of Birth: Mar 08, 1981 Admit Type: Outpatient Age: [id number] [facility] Gender: Male Note Status: Finalized Procedure:            Upper GI endoscopy Indications:          Dysphagia, Heartburn Providers:            Boykin Nearingeodoro K. Shambhavi Salley MD, MD Medicines:            Propofol per Anesthesia Complications:        No immediate complications. Procedure:            Pre-Anesthesia Assessment:                       - The risks and benefits of the procedure and the                        sedation options and risks were discussed with the                        patient. All questions were answered and informed                        consent was obtained.                       - Patient identification and proposed procedure were                        verified prior to the procedure by the nurse. The                        procedure was verified in the procedure room.                       - ASA Grade Assessment: II - A patient with mild                        systemic disease.                       - After reviewing the risks and benefits, the patient                        was deemed in satisfactory condition to undergo the                        procedure.                       After obtaining informed consent, the endoscope was                        passed under direct vision. Throughout the procedure,                        the patient's blood pressure, pulse, and oxygen                        saturations were monitored continuously. The Endoscope  was introduced through the mouth, and advanced to the                        third part of duodenum. The upper GI endoscopy was                        accomplished without difficulty. The patient tolerated                        the procedure well. Findings:      The  examined esophagus was normal. Biopsies were obtained from the       proximal and distal esophagus with cold forceps for histology of       suspected eosinophilic esophagitis.      A hypertonic lower esophageal sphincter was found. The scope was       withdrawn. Dilation was performed with a Maloney dilator with no       resistance at 54 Fr.      The entire examined stomach was normal.      The examined duodenum was normal. Impression:           - Normal esophagus. Biopsied.                       - Hypertonic lower esophageal sphincter. Dilated.                       - Normal stomach.                       - Normal examined duodenum. Recommendation:       - Patient has a contact number available for                        emergencies. The signs and symptoms of potential                        delayed complications were discussed with the patient.                        Return to normal activities tomorrow. Written discharge                        instructions were provided to the patient.                       - Resume previous diet.                       - Continue present medications.                       - Await pathology results.                       - Repeat upper endoscopy to evaluate the response to                        therapy.                       - Return to GI office in 3 months.                       -  The findings and recommendations were discussed with                        the patient and their family. Procedure Code(s):    --- Professional ---                       [phone], Esophagogastroduodenoscopy, flexible, transoral;                        with biopsy, single or multiple                       43450, Dilation of esophagus, by unguided sound or                        bougie, single or multiple passes Diagnosis Code(s):    --- Professional ---                       K22.8, Other specified diseases of esophagus                       R13.10, Dysphagia, unspecified                        R12, Heartburn CPT copyright 2016 [employer]. All rights reserved. The codes documented in this report are preliminary and upon coder review may  be revised to meet current compliance requirements. Stanton Kidney MD, MD 09/05/2017 2:16:20 PM This report has been signed electronically. Number of Addenda: 0 Note Initiated On: 09/05/2017 1:57 PM      [facility]

## 2017-09-06 ENCOUNTER — Encounter: Payer: Self-pay | Admitting: Internal Medicine

## 2017-09-08 LAB — SURGICAL PATHOLOGY

## 2017-12-01 ENCOUNTER — Other Ambulatory Visit: Payer: Self-pay | Admitting: Internal Medicine

## 2017-12-01 DIAGNOSIS — R1012 Left upper quadrant pain: Secondary | ICD-10-CM

## 2017-12-01 DIAGNOSIS — R112 Nausea with vomiting, unspecified: Secondary | ICD-10-CM

## 2017-12-04 ENCOUNTER — Ambulatory Visit: Payer: Self-pay | Admitting: Internal Medicine

## 2017-12-05 ENCOUNTER — Ambulatory Visit
Admission: RE | Admit: 2017-12-05 | Discharge: 2017-12-05 | Disposition: A | Discharge status: Home / Self Care | Payer: Medicare Other | Source: Ambulatory Visit | Attending: Internal Medicine | Admitting: Internal Medicine

## 2017-12-05 DIAGNOSIS — R1012 Left upper quadrant pain: Secondary | ICD-10-CM | POA: Insufficient documentation

## 2017-12-05 DIAGNOSIS — R112 Nausea with vomiting, unspecified: Secondary | ICD-10-CM

## 2017-12-12 ENCOUNTER — Ambulatory Visit: Payer: Self-pay | Admitting: Internal Medicine

## 2018-01-18 ENCOUNTER — Encounter: Payer: Self-pay | Admitting: Internal Medicine

## 2018-01-18 ENCOUNTER — Ambulatory Visit (INDEPENDENT_AMBULATORY_CARE_PROVIDER_SITE_OTHER): Payer: Medicare Other | Admitting: Internal Medicine

## 2018-01-18 VITALS — BP 130/98 | HR 92 | Resp 16 | Ht 67.0 in | Wt 280.6 lb

## 2018-01-18 DIAGNOSIS — J301 Allergic rhinitis due to pollen: Secondary | ICD-10-CM

## 2018-01-18 DIAGNOSIS — J449 Chronic obstructive pulmonary disease, unspecified: Secondary | ICD-10-CM | POA: Diagnosis not present

## 2018-01-18 DIAGNOSIS — G4710 Hypersomnia, unspecified: Secondary | ICD-10-CM | POA: Diagnosis not present

## 2018-01-18 DIAGNOSIS — R0602 Shortness of breath: Secondary | ICD-10-CM

## 2018-01-18 NOTE — Progress Notes (Signed)
[facility] [address] [place], [place] [address]  Pulmonary Sleep Medicine  Office Visit Note  Patient Name: Steven [name] DOB: 10-06-1981 MRN [id number]  Date of Service: 01/18/2018  Complaints/HPI:  Patient is here for follow-up he has been doing relatively okay had is endoscopy done by GI.  No issues with that.  He does have hypersomnia with snoring and he has gained weight.  The patient has had a sleep study done in the past he cannot recall where or when.  He states he is currently not on treatment.  He does admit to having tiredness during the daytime.  In addition he has COPD he was supposed to come in for pulmonary function test which he did not to assess the severity I will reschedule this.  As far as his allergies are concerned he has been on allergy shots in the past and he discontinued those.  ROS  General: (-) fever, (-) chills, (-) night sweats, (-) weakness Skin: (-) rashes, (-) itching,. Eyes: (-) visual changes, (-) redness, (-) itching. Nose and Sinuses: (-) nasal stuffiness or itchiness, (-) postnasal drip, (-) nosebleeds, (-) sinus trouble. Mouth and Throat: (-) sore throat, (-) hoarseness. Neck: (-) swollen glands, (-) enlarged thyroid, (-) neck pain. Respiratory: - cough, (-) bloody sputum, + shortness of breath, - wheezing. Cardiovascular: - ankle swelling, (-) chest pain. Lymphatic: (-) lymph node enlargement. Neurologic: (-) numbness, (-) tingling. Psychiatric: (-) anxiety, (-) depression   Current Medication: Outpatient Encounter Medications as of 01/18/2018  Medication Sig Note  . acetaminophen (TYLENOL) 500 MG tablet Take 1,000 mg by mouth every 6 (six) hours as needed for moderate pain.   Marland Kitchen azithromycin (ZITHROMAX) 250 MG tablet Take 1 tablet (250 mg total) by mouth daily. Take first 2 tablets together, then 1 every day until finished. (Patient not taking: Reported on 09/05/2017)   . BREO ELLIPTA 200-25 MCG/INH AEPB INHALE 1 PUFF INTO  THE LUNGS DAILY   . clonazePAM (KLONOPIN) 1 MG tablet Take 1 mg by mouth 3 (three) times daily as needed.    . COMBIVENT RESPIMAT 20-100 MCG/ACT AERS respimat Inhale 1 puff into the lungs every 6 (six) hours as needed for wheezing.  02/01/2016: Received from: External Pharmacy  . EPIPEN 2-PAK 0.3 MG/0.3ML SOAJ injection  12/10/2015: Received from: External Pharmacy  . escitalopram (LEXAPRO) 20 MG tablet Take 20 mg by mouth daily. 07/11/2015: \   . fexofenadine (ALLEGRA) 180 MG tablet Take 180 mg by mouth daily.   . fexofenadine-pseudoephedrine (ALLEGRA-D 24) 180-240 MG 24 hr tablet Take 1 tablet by mouth daily as needed (will take when allergies get "really bad").   . fluticasone (FLONASE) 50 MCG/ACT nasal spray Place 1 spray into both nostrils daily.   . Fluticasone-Salmeterol (ADVAIR) 500-50 MCG/DOSE AEPB Inhale 1 puff 2 (two) times daily into the lungs.   Marland Kitchen lisinopril (PRINIVIL,ZESTRIL) 10 MG tablet Take 5 mg by mouth. 12/10/2015: Received from: [facility]  . Multiple Vitamin (MULTIVITAMIN) tablet Take 1 tablet daily by mouth.   . naproxen (NAPROSYN) 500 MG tablet Take 1 tablet (500 mg total) by mouth 2 (two) times daily.   Marland Kitchen omeprazole (PRILOSEC) 20 MG capsule Take 20 mg by mouth daily.   . potassium chloride SA (K-DUR,KLOR-CON) 20 MEQ tablet Take 1 tablet (20 mEq total) by mouth 2 (two) times daily.   . predniSONE (DELTASONE) 10 MG tablet Take 2 tablets (20 mg total) by mouth daily. (Patient not taking: Reported on 09/05/2017)   .  PROAIR HFA 108 (90 Base) MCG/ACT inhaler INAHLE 2 PUFFS BY MOUTH EVERY 4 TO 6 HOURS AS NEEDED    No facility-administered encounter medications on file as of 01/18/2018.     Surgical History: Past Surgical History:  Procedure Laterality Date  . ESOPHAGOGASTRODUODENOSCOPY (EGD) WITH PROPOFOL N/A 09/05/2017   Procedure: ESOPHAGOGASTRODUODENOSCOPY (EGD) WITH PROPOFOL;  Surgeon: Toledo, Boykin Nearing, MD;  Location: [facility] ENDOSCOPY;  Service: Gastroenterology;  Laterality:  N/A;  . none    . TUBES IN EARS      Medical History: Past Medical History:  Diagnosis Date  . Anxiety   . Arthritis    knee  . Asthma   . Chronic mental illness   . COPD (chronic obstructive pulmonary disease) (HCC)   . Depression   . GERD (gastroesophageal reflux disease)   . Heart palpitations   . Pulmonary fibrosis (HCC)     Family History: Family History  Problem Relation Age of Onset  . COPD Mother   . Asthma Mother   . Mental illness Mother   . COPD Father   . Pulmonary fibrosis Father   . Lung cancer Father     Social History: Social History   Socioeconomic History  . Marital status: Married    Spouse name: Not on file  . Number of children: Not on file  . Years of education: Not on file  . Highest education level: Not on file  Occupational History  . Not on file  Social Needs  . Financial resource strain: Not on file  . Food insecurity:    Worry: Not on file    Inability: Not on file  . Transportation needs:    Medical: Not on file    Non-medical: Not on file  Tobacco Use  . Smoking status: Former Smoker    Packs/day: 0.50    Years: 2.00    Pack years: 1.00    Types: Cigarettes    Last attempt to quit: 10/31/2000    Years since quitting: 17.2  . Smokeless tobacco: Current User    Types: Chew  . Tobacco comment: 2003  Substance and Sexual Activity  . Alcohol use: No    Alcohol/week: 0.0 oz  . Drug use: No  . Sexual activity: Not on file  Lifestyle  . Physical activity:    Days per week: Not on file    Minutes per session: Not on file  . Stress: Not on file  Relationships  . Social connections:    Talks on phone: Not on file    Gets together: Not on file    Attends religious service: Not on file    Active member of club or organization: Not on file    Attends meetings of clubs or organizations: Not on file    Relationship status: Not on file  . Intimate partner violence:    Fear of current or ex partner: Not on file    Emotionally  abused: Not on file    Physically abused: Not on file    Forced sexual activity: Not on file  Other Topics Concern  . Not on file  Social History Narrative  . Not on file    Vital Signs: Blood pressure (!) 130/98, pulse 92, resp. rate 16, height 5\' 7"  (1.702 m), weight 280 lb 9.6 oz (127.3 kg), SpO2 97 %.  Examination: General Appearance: The patient is well-developed, well-nourished, and in no distress. Skin: Gross inspection of skin unremarkable. Head: normocephalic, no gross deformities. Eyes: no gross  deformities noted. ENT: ears appear grossly normal no exudates. Neck: Supple. No thyromegaly. No LAD. Respiratory: clear at this time. Cardiovascular: Normal S1 and S2 without murmur or rub. Extremities: No cyanosis. pulses are equal. Neurologic: Alert and oriented. No involuntary movements.  LABS: No results found for this or any previous visit (from the past 2160 hour(s)).  Radiology: [place] Abdomen Complete  Result Date: 12/06/2017 CLINICAL DATA:  Left upper quadrant pain, nausea and vomiting, and change in bowel habits for 2 months. EXAM: ABDOMEN ULTRASOUND COMPLETE COMPARISON:  None. FINDINGS: Gallbladder: No gallstones or wall thickening visualized. No sonographic Murphy sign noted by sonographer. Common bile duct: Diameter: 3 mm, within normal limits. Liver: No focal lesion identified. Within normal limits in parenchymal echogenicity. Portal vein is patent on color Doppler imaging with normal direction of blood flow towards the liver. IVC: No abnormality visualized. Pancreas: Visualized portion unremarkable. Spleen: Size and appearance within normal limits. Right Kidney: Length: 12.0 cm. Echogenicity within normal limits. No mass or hydronephrosis visualized. Left Kidney: Length: 10.9 cm. Echogenicity within normal limits. No mass or hydronephrosis visualized. Abdominal aorta: No aneurysm visualized. Other findings: None. IMPRESSION: Negative abdomen ultrasound. Electronically Signed    By: Myles RosenthalJohn  Stahl M.D.   On: 12/06/2017 08:53    No results found.  No results found.    Assessment and Plan: Patient Active Problem List   Diagnosis Date Noted  . Asthma, chronic 04/05/2015  . Chronic obstructive airway disease with asthma (HCC) 03/17/2015  . Obesity 03/17/2015  . DOE (dyspnea on exertion) 03/17/2015    1. COPD  Continue with current inhaler regimen as prescribed.  Seems to be doing fair with this.  Also counseled on avoiding cigarettes in smokers. 2. Hypersomnia  We will schedule for sleep study will continue with supportive care 3. Allergic rhinitis once again recommended possibility of doing allergy shots but is not interested 4. Obesity spoke with him about weight reduction calorie counting and exercise 5. SOB secondary to COPD we will get pulmonary function is as discussed  General Counseling: I have discussed the findings of the evaluation and examination with Harvie Heckandy.  I have also discussed any further diagnostic evaluation thatmay be needed or ordered today. Javion verbalizes understanding of the findings of todays visit. We also reviewed his medications today and discussed drug interactions and side effects including but not limited excessive drowsiness and altered mental states. We also discussed that there is always a risk not just to him but also people around him. he has been encouraged to call the office with any questions or concerns that should arise related to todays visit.    Time spent: 15min  I have personally obtained a history, examined the patient, evaluated laboratory and imaging results, formulated the assessment and plan and placed orders.    Yevonne PaxSaadat A Khan, MD [facility] Pulmonary and Critical Care Sleep medicine

## 2018-01-18 NOTE — Patient Instructions (Signed)

## 2018-01-24 ENCOUNTER — Ambulatory Visit (INDEPENDENT_AMBULATORY_CARE_PROVIDER_SITE_OTHER): Payer: Medicare Other | Admitting: Internal Medicine

## 2018-01-24 DIAGNOSIS — R0602 Shortness of breath: Secondary | ICD-10-CM

## 2018-01-24 DIAGNOSIS — G4710 Hypersomnia, unspecified: Secondary | ICD-10-CM

## 2018-02-08 NOTE — Procedures (Signed)
[facility] [address] [place], [id number]  DATE OF SERVICE:  January 24, 2018  Complete Pulmonary Function Testing Interpretation:  FINDINGS:   forced vital capacity mildly decreased FEV1 is 2.26 L which is 55% of predicted and is moderately decreased the FEV1 FVC ratio is moderately decreased.  Post bronchodilator there is no significant improvement in the FEV1 however clinical improvement may occur in the absence of spirometric improvement.  Total lung capacity is normal by body box residual volume is increased residual volume total lung capacity ratio is increased FRC is normal the DLCO was normal  IMPRESSION:   the pulmonary function studies consistent with moderate obstructive lung disease. There is no significant response to bronchodilators DLCO was within normal limits  Yevonne PaxSaadat A Joakim Huesman, MD [facility] Pulmonary Critical Care Medicine Sleep Medicine

## 2018-02-13 ENCOUNTER — Other Ambulatory Visit (INDEPENDENT_AMBULATORY_CARE_PROVIDER_SITE_OTHER): Payer: Medicare Other | Admitting: Internal Medicine

## 2018-02-13 DIAGNOSIS — G4710 Hypersomnia, unspecified: Secondary | ICD-10-CM | POA: Diagnosis not present

## 2018-02-20 ENCOUNTER — Ambulatory Visit (INDEPENDENT_AMBULATORY_CARE_PROVIDER_SITE_OTHER): Payer: Medicare Other | Admitting: Urology

## 2018-02-20 ENCOUNTER — Encounter: Payer: Self-pay | Admitting: Urology

## 2018-02-20 VITALS — BP 138/98 | HR 87 | Ht 67.0 in | Wt 274.0 lb

## 2018-02-20 DIAGNOSIS — Z3009 Encounter for other general counseling and advice on contraception: Secondary | ICD-10-CM

## 2018-02-20 MED ORDER — DIAZEPAM 10 MG PO TABS: 10.0000 mg | ORAL_TABLET | Freq: Once | ORAL | 0 refills | Status: AC

## 2018-02-20 NOTE — Progress Notes (Signed)
02/20/2018 1:54 PM   Steven [name] January 19, 1981 [id number]  [facility] [address] [place], [place] [id number]  Chief Complaint  Patient presents with  . VAS Consult    HPI: 37 y.o. year old male referred for further evaluation of possible vasectomy.  He has 4 biological children.  He has been.  Both he and his wife are in agreement proceeding with vasectomy.  His last child was 2 months ago.  Denies a history of testicular trauma or pain.  No urinary issues.  No previous scrotal surgeries.  He does have extreme anxiety and takes Klonopin 3 times a day.  He is anxious that he may not be able to tolerate the procedure in the office.   PMH: Past Medical History:  Diagnosis Date  . Anxiety   . Arthritis    knee  . Asthma   . Chronic mental illness   . COPD (chronic obstructive pulmonary disease) (HCC)   . Depression   . GERD (gastroesophageal reflux disease)   . Heart palpitations   . Pulmonary fibrosis [facility](HCC)     Surgical History: Past Surgical History:  Procedure Laterality Date  . ESOPHAGOGASTRODUODENOSCOPY (EGD) WITH PROPOFOL N/A 09/05/2017   Procedure: ESOPHAGOGASTRODUODENOSCOPY (EGD) WITH PROPOFOL;  Surgeon: Toledo, Boykin Nearingeodoro K, MD;  Location: [facility] ENDOSCOPY;  Service: Gastroenterology;  Laterality: N/A;  . none    . TUBES IN EARS      Home Medications:  Allergies as of 02/20/2018      Reactions   Diphenhydramine Anaphylaxis   Guaifenesin Swelling   Bodily Swelling   Amoxicillin Nausea Only   Other reaction(s): Other (See Comments) Increases anxiety Other reaction(s): Other (See Comments) Increases anxiety      Medication List        Accurate as of 02/20/18  1:54 PM. Always use your most recent med list.          acetaminophen 500 MG tablet Commonly known as:  TYLENOL Take 1,000 mg by mouth every 6 (six) hours as needed for moderate pain.   BREO ELLIPTA 200-25 MCG/INH Aepb Generic drug:  fluticasone  furoate-vilanterol INHALE 1 PUFF INTO THE LUNGS DAILY   clonazePAM 1 MG tablet Commonly known as:  KLONOPIN Take 1 mg by mouth 3 (three) times daily as needed.   COMBIVENT RESPIMAT 20-100 MCG/ACT Aers respimat Generic drug:  Ipratropium-Albuterol Inhale 1 puff into the lungs every 6 (six) hours as needed for wheezing.   diazepam 10 MG tablet Commonly known as:  VALIUM Take 1 tablet (10 mg total) by mouth once for 1 dose. Take 1 hour prior to procedure. Please have a driver available.   EPIPEN 2-PAK 0.3 mg/0.3 mL Soaj injection Generic drug:  EPINEPHrine   escitalopram 20 MG tablet Commonly known as:  LEXAPRO Take 20 mg by mouth daily.   fexofenadine 180 MG tablet Commonly known as:  ALLEGRA Take 180 mg by mouth daily.   fexofenadine-pseudoephedrine 180-240 MG 24 hr tablet Commonly known as:  ALLEGRA-D 24 Take 1 tablet by mouth daily as needed (will take when allergies get "really bad").   fluticasone 50 MCG/ACT nasal spray Commonly known as:  FLONASE Place 1 spray into both nostrils daily.   Fluticasone-Salmeterol 500-50 MCG/DOSE Aepb Commonly known as:  ADVAIR Inhale 1 puff 2 (two) times daily into the lungs.   lisinopril 10 MG tablet Commonly known as:  PRINIVIL,ZESTRIL Take 5 mg by mouth.   multivitamin tablet Take 1 tablet daily by mouth.  naproxen 500 MG tablet Commonly known as:  NAPROSYN Take 1 tablet (500 mg total) by mouth 2 (two) times daily.   omeprazole 20 MG capsule Commonly known as:  PRILOSEC Take 20 mg by mouth daily.   potassium chloride SA 20 MEQ tablet Commonly known as:  K-DUR,KLOR-CON Take 1 tablet (20 mEq total) by mouth 2 (two) times daily.   PROAIR HFA 108 (90 Base) MCG/ACT inhaler Generic drug:  albuterol INAHLE 2 PUFFS BY MOUTH EVERY 4 TO 6 HOURS AS NEEDED       Allergies:  Allergies  Allergen Reactions  . Diphenhydramine Anaphylaxis  . Guaifenesin Swelling    Bodily Swelling  . Amoxicillin Nausea Only    Other  reaction(s): Other (See Comments) Increases anxiety Other reaction(s): Other (See Comments) Increases anxiety    Family History: Family History  Problem Relation Age of Onset  . COPD Mother   . Asthma Mother   . Mental illness Mother   . COPD Father   . Pulmonary fibrosis Father   . Lung cancer Father     Social History:  reports that he quit smoking about 17 years ago. His smoking use included cigarettes. He has a 1.00 pack-year smoking history. His smokeless tobacco use includes chew. He reports that he does not drink alcohol or use drugs.  ROS: UROLOGY Frequent Urination?: Yes Hard to postpone urination?: No Burning/pain with urination?: Yes Get up at night to urinate?: Yes Leakage of urine?: No Urine stream starts and stops?: No Trouble starting stream?: No Do you have to strain to urinate?: No Blood in urine?: No Urinary tract infection?: No Sexually transmitted disease?: No Injury to kidneys or bladder?: No Painful intercourse?: No Weak stream?: No Erection problems?: No Penile pain?: No  Gastrointestinal Nausea?: No Vomiting?: No Indigestion/heartburn?: Yes Diarrhea?: No Constipation?: No  Constitutional Fever: No Night sweats?: No Weight loss?: No Fatigue?: No  Skin Skin rash/lesions?: No Itching?: No  Eyes Blurred vision?: No Double vision?: No  Ears/Nose/Throat Sore throat?: No Sinus problems?: Yes  Hematologic/Lymphatic Swollen glands?: No Easy bruising?: No  Cardiovascular Leg swelling?: No Chest pain?: No  Respiratory Cough?: No Shortness of breath?: Yes  Endocrine Excessive thirst?: Yes  Musculoskeletal Back pain?: No Joint pain?: Yes  Neurological Headaches?: Yes Dizziness?: No  Psychologic Depression?: Yes Anxiety?: Yes  Physical Exam: BP (!) 138/98   Pulse 87   Ht 5\' 7"  (1.702 m)   Wt 274 lb (124.3 kg)   BMI 42.91 kg/m   Constitutional:  Alert and oriented, No acute distress. HEENT: [place] AT, moist mucus  membranes.  Trachea midline, no masses. Cardiovascular: No clubbing, cyanosis, or edema. Respiratory: Normal respiratory effort, no increased work of breathing. GI: Abdomen is soft, nontender, nondistended, no abdominal masses GU: Normal phallus.  Bilateral descended testicles without masses.  Vasa easily palpable bilaterally. Skin: No rashes, bruises or suspicious lesions. Neurologic: Grossly intact, no focal deficits, moving all 4 extremities. Psychiatric: Normal mood and affect.  Laboratory Data: N/a  Urinalysis n/a  Pertinent Imaging: N/a  Assessment & Plan:    1. Vasectomy evaluation Today, we discussed what the vas deferens is, where it is located, and its function. We reviewed the procedure for vasectomy, it's risks, benefits, alternatives, and likelihood of achieving his goals. We discussed in detail the procedure, complications, and recovery as well as the need for clearance prior to unprotected intercourse. We discussed that vasectomy does not protect against sexually transmitted diseases. We discussed that this procedure does not result in immediate  sterility and that they would need to use other forms of birth control until he has been cleared with negative postvasectomy semen analyses. I explained that the procedure is considered to be permanent and that attempts at reversal have varying degrees of success. These options include vasectomy reversal, sperm retrieval, and in vitro fertilization; these can be very expensive. We discussed the chance of postvasectomy pain syndrome which occurs in less than 5% of patients. I explained to the patient that there is no treatment to resolve this chronic pain, and that if it developed I would not be able to help resolve the issue, but that surgery is generally not needed for correction. I explained there have even been reports of systemic like illness associated with this chronic pain, and that there was no good cure. I explained that vasectomy  it is not a 100% reliable form of birth control, and the risk of pregnancy after vasectomy is approximately 1 in 2000 men who had a negative postvasectomy semen analysis or rare non-motile sperm. I explained that repeat vasectomy was necessary in less than 1% of vasectomy procedures when employing the type of technique that I use. I explained that he should refrain from ejaculation for approximately one week following vasectomy. I explained that there are other options for birth control which are permanent and non-permanent; we discussed these. I explained the rates of surgical complications, such as symptomatic hematoma or infection, are low (1-2%) and vary with the surgeon's experience and criteria used to diagnose the complication.   The patient had the opportunity to ask questions to his stated satisfaction. He voiced understanding of the above factors and stated that he has read all the information provided to him and the packets and informed consent.  He is interested in receiving of Valium 10 mg prior to the procedure for the purpose of anxiolysis.  A prescription was given today.  He will have a driver on the day of the procedure.  If he changes his mind about having in the office, we can always proceed with this in the operating room given his extreme anxiety and need for additional sedation.  He will call us and let us know if he like to do this.  Otherwise we will tentatively set up for an office.   Vanna Scotland, MD  [facility] [address] [place], [place] [phone]

## 2018-02-26 ENCOUNTER — Encounter: Payer: Self-pay | Admitting: Internal Medicine

## 2018-02-26 ENCOUNTER — Ambulatory Visit (INDEPENDENT_AMBULATORY_CARE_PROVIDER_SITE_OTHER): Payer: Medicare Other | Admitting: Internal Medicine

## 2018-02-26 VITALS — BP 122/88 | HR 87 | Resp 16 | Ht 67.0 in | Wt 279.2 lb

## 2018-02-26 DIAGNOSIS — R0683 Snoring: Secondary | ICD-10-CM

## 2018-02-26 DIAGNOSIS — G4710 Hypersomnia, unspecified: Secondary | ICD-10-CM | POA: Diagnosis not present

## 2018-02-26 DIAGNOSIS — J301 Allergic rhinitis due to pollen: Secondary | ICD-10-CM | POA: Diagnosis not present

## 2018-02-26 DIAGNOSIS — J449 Chronic obstructive pulmonary disease, unspecified: Secondary | ICD-10-CM

## 2018-02-26 NOTE — Patient Instructions (Signed)
Hypersomnia Hypersomnia is when you feel extremely tired during the day even though you're getting plenty of sleep at night. You may need to take naps during the day, and you may also be extremely difficult to wake up when you are sleeping. What are the causes? The cause of your hypersomnia may not be known. Hypersomnia may be caused by:  Medicines.  Sleep disorders, such as narcolepsy.  Trauma or injury to your head or nervous system.  Using drugs or alcohol.  Tumors.  Medical conditions, such as depression or hypothyroidism.  Genetics.  What are the signs or symptoms? The main symptoms of hypersomnia include:  Feeling extremely tired throughout the day.  Being very difficult to wake up.  Sleeping for longer and longer periods.  Taking naps throughout the day.  Other symptoms may include:  Feeling: ? Restless. ? Annoyed. ? Anxious. ? Low energy.  Having difficulty: ? Remembering. ? Speaking. ? Thinking.  Losing your appetite.  Experiencing hallucinations.  How is this diagnosed? Hypersomnia may be diagnosed by:  Medical history and physical exam. This will include a sleep history.  Completing sleep logs.  Tests may also be done, such as: ? Polysomnography. ? Multiple sleep latency test (MSLT).  How is this treated? There is no cure for hypersomnia, but treatment can be very effective in helping manage the condition. Treatment may include:  Lifestyle and sleeping strategies to help cope with the condition.  Stimulant medicines.  Treating any underlying causes of hypersomnia.  Follow these instructions at home:  Take medicines only as directed by your health care provider.  Schedule short naps for when you feel sleepiest during the day. Tell your employer or teachers that you have hypersomnia. You may be able to adjust your schedule to include time for naps.  Avoid drinking alcohol or caffeinated beverages.  Do not eat a heavy meal before  bedtime. Eat at about the same times every day.  Do not drive or operate heavy machinery if you are sleepy.  Do not swim or go out on the water without a life jacket.  If possible, adjust your schedule so that you do not have to work or be active at night.  Keep all follow-up visits as directed by your health care provider. This is important. Contact a health care provider if:  You have new symptoms.  Your symptoms get worse. Get help right away if: You have serious thoughts of hurting yourself or someone else. This information is not intended to replace advice given to you by your health care provider. Make sure you discuss any questions you have with your health care provider. Document Released: 10/07/2002 Document Revised: 03/24/2016 Document Reviewed: 05/22/2014 Elsevier Interactive Patient Education  2018 [employer]  

## 2018-02-26 NOTE — Progress Notes (Signed)
[facility] [address] [place], [place] [address]  Pulmonary Sleep Medicine   Office Visit Note  Patient Name: Steven [name] DOB: 1981/04/14 MRN [id number]  Date of Service: 02/26/2018  Complaints/HPI:   Patient is here for follow-up after sleep study PSG showed that he does not have significant sleep apnea.  Does have moderate snoring noted.  The patient also had saturations down to 90%.  Patient denies having any cough or congestion at this time.  Does have sinus issues and rhinitis issues.  Patient has never seen a a ENT doctor regarding his nasal issues.  Denies having a broken nose.  ROS  General: (-) fever, (-) chills, (-) night sweats, (-) weakness Skin: (-) rashes, (-) itching,. Eyes: (-) visual changes, (-) redness, (-) itching. Nose and Sinuses: (-) nasal stuffiness or itchiness, (-) postnasal drip, (-) nosebleeds, (-) sinus trouble. Mouth and Throat: (-) sore throat, (-) hoarseness. Neck: (-) swollen glands, (-) enlarged thyroid, (-) neck pain. Respiratory: - cough, (-) bloody sputum, - shortness of breath, - wheezing. Cardiovascular: - ankle swelling, (-) chest pain. Lymphatic: (-) lymph node enlargement. Neurologic: (-) numbness, (-) tingling. Psychiatric: (-) anxiety, (-) depression   Current Medication: Outpatient Encounter Medications as of 02/26/2018  Medication Sig Note  . acetaminophen (TYLENOL) 500 MG tablet Take 1,000 mg by mouth every 6 (six) hours as needed for moderate pain.   Marland Kitchen BREO ELLIPTA 200-25 MCG/INH AEPB INHALE 1 PUFF INTO THE LUNGS DAILY   . clonazePAM (KLONOPIN) 1 MG tablet Take 1 mg by mouth 3 (three) times daily as needed.    . COMBIVENT RESPIMAT 20-100 MCG/ACT AERS respimat Inhale 1 puff into the lungs every 6 (six) hours as needed for wheezing.  02/01/2016: Received from: External Pharmacy  . EPIPEN 2-PAK 0.3 MG/0.3ML SOAJ injection  12/10/2015: Received from: External Pharmacy  . escitalopram (LEXAPRO) 20 MG tablet Take 20 mg  by mouth daily. 07/11/2015: \   . fexofenadine (ALLEGRA) 180 MG tablet Take 180 mg by mouth daily.   . fexofenadine-pseudoephedrine (ALLEGRA-D 24) 180-240 MG 24 hr tablet Take 1 tablet by mouth daily as needed (will take when allergies get "really bad").   . fluticasone (FLONASE) 50 MCG/ACT nasal spray Place 1 spray into both nostrils daily.   . Fluticasone-Salmeterol (ADVAIR) 500-50 MCG/DOSE AEPB Inhale 1 puff 2 (two) times daily into the lungs.   Marland Kitchen lisinopril (PRINIVIL,ZESTRIL) 10 MG tablet Take 5 mg by mouth. 12/10/2015: Received from: [facility]  . Multiple Vitamin (MULTIVITAMIN) tablet Take 1 tablet daily by mouth.   . naproxen (NAPROSYN) 500 MG tablet Take 1 tablet (500 mg total) by mouth 2 (two) times daily.   Marland Kitchen omeprazole (PRILOSEC) 20 MG capsule Take 20 mg by mouth daily.   . potassium chloride SA (K-DUR,KLOR-CON) 20 MEQ tablet Take 1 tablet (20 mEq total) by mouth 2 (two) times daily.   Marland Kitchen PROAIR HFA 108 (90 Base) MCG/ACT inhaler INAHLE 2 PUFFS BY MOUTH EVERY 4 TO 6 HOURS AS NEEDED    No facility-administered encounter medications on file as of 02/26/2018.     Surgical History: Past Surgical History:  Procedure Laterality Date  . ESOPHAGOGASTRODUODENOSCOPY (EGD) WITH PROPOFOL N/A 09/05/2017   Procedure: ESOPHAGOGASTRODUODENOSCOPY (EGD) WITH PROPOFOL;  Surgeon: Toledo, Boykin Nearing, MD;  Location: [facility] ENDOSCOPY;  Service: Gastroenterology;  Laterality: N/A;  . none    . TUBES IN EARS      Medical History: Past Medical History:  Diagnosis Date  . Anxiety   .  Arthritis    knee  . Asthma   . Chronic mental illness   . COPD (chronic obstructive pulmonary disease) (HCC)   . Depression   . GERD (gastroesophageal reflux disease)   . Heart palpitations   . Pulmonary fibrosis (HCC)     Family History: Family History  Problem Relation Age of Onset  . COPD Mother   . Asthma Mother   . Mental illness Mother   . COPD Father   . Pulmonary fibrosis Father   . Lung cancer Father      Social History: Social History   Socioeconomic History  . Marital status: Married    Spouse name: Not on file  . Number of children: Not on file  . Years of education: Not on file  . Highest education level: Not on file  Occupational History  . Not on file  Social Needs  . Financial resource strain: Not on file  . Food insecurity:    Worry: Not on file    Inability: Not on file  . Transportation needs:    Medical: Not on file    Non-medical: Not on file  Tobacco Use  . Smoking status: Former Smoker    Packs/day: 0.50    Years: 2.00    Pack years: 1.00    Types: Cigarettes    Last attempt to quit: 10/31/2000    Years since quitting: 17.3  . Smokeless tobacco: Current User    Types: Chew  . Tobacco comment: 2003  Substance and Sexual Activity  . Alcohol use: No    Alcohol/week: 0.0 oz  . Drug use: No  . Sexual activity: Not on file  Lifestyle  . Physical activity:    Days per week: Not on file    Minutes per session: Not on file  . Stress: Not on file  Relationships  . Social connections:    Talks on phone: Not on file    Gets together: Not on file    Attends religious service: Not on file    Active member of club or organization: Not on file    Attends meetings of clubs or organizations: Not on file    Relationship status: Not on file  . Intimate partner violence:    Fear of current or ex partner: Not on file    Emotionally abused: Not on file    Physically abused: Not on file    Forced sexual activity: Not on file  Other Topics Concern  . Not on file  Social History Narrative  . Not on file    Vital Signs: Blood pressure 122/88, pulse 87, resp. rate 16, height  (1.702 m), weight 279 lb 3.2 oz (126.6 kg), SpO2 99 %.  Examination: General Appearance: The patient is well-developed, well-nourished, and in no distress. Skin: Gross inspection of skin unremarkable. Head: normocephalic, no gross deformities. Eyes: no gross deformities noted. ENT:  ears appear grossly normal no exudates. Neck: Supple. No thyromegaly. No LAD. Respiratory: no rhonchi noted. Cardiovascular: Normal S1 and S2 without murmur or rub. Extremities: No cyanosis. pulses are equal. Neurologic: Alert and oriented. No involuntary movements.  LABS: No results found for this or any previous visit (from the past 2160 hour(s)).  Radiology: US Abdomen Complete  Result Date: 12/06/2017 CLINICAL DATA:  Left upper quadrant pain, nausea and vomiting, and change in bowel habits for 2 months. EXAM: ABDOMEN ULTRASOUND COMPLETE COMPARISON:  None. FINDINGS: Gallbladder: No gallstones or wall thickening visualized. No sonographic Murphy sign noted by  sonographer. Common bile duct: Diameter: 3 mm, within normal limits. Liver: No focal lesion identified. Within normal limits in parenchymal echogenicity. Portal vein is patent on color Doppler imaging with normal direction of blood flow towards the liver. IVC: No abnormality visualized. Pancreas: Visualized portion unremarkable. Spleen: Size and appearance within normal limits. Right Kidney: Length: 12.0 cm. Echogenicity within normal limits. No mass or hydronephrosis visualized. Left Kidney: Length: 10.9 cm. Echogenicity within normal limits. No mass or hydronephrosis visualized. Abdominal aorta: No aneurysm visualized. Other findings: None. IMPRESSION: Negative abdomen ultrasound. Electronically Signed   By: Myles Rosenthal M.D.   On: 12/06/2017 08:53    No results found.  No results found.    Assessment and Plan: Patient Active Problem List   Diagnosis Date Noted  . Asthma, chronic 04/05/2015  . Chronic obstructive airway disease with asthma (HCC) 03/17/2015  . Obesity 03/17/2015  . DOE (dyspnea on exertion) 03/17/2015  . Environmental allergies 04/03/2014  . Chest pain 08/02/2013  . Thyroid nodule 03/21/2013    1. Hypersomnia  Not related to sleep apnea.  He needs to work on weight loss as well as sleep hygiene increase his  exercise routine and try to work on a more healthy lifestyle. 2. Snoring  Will have him see ENT for further evaluation 3. Morbid obesity needs to work on weight loss 4. Allergic Rhinitis continue with present management 5. COPD the last pulmonary function showed moderate obstructive lung disease  General Counseling: I have discussed the findings of the evaluation and examination with Harvie Heck.  I have also discussed any further diagnostic evaluation thatmay be needed or ordered today. Bosten verbalizes understanding of the findings of todays visit. We also reviewed his medications today and discussed drug interactions and side effects including but not limited excessive drowsiness and altered mental states. We also discussed that there is always a risk not just to him but also people around him. he has been encouraged to call the office with any questions or concerns that should arise related to todays visit.    Time spent:  I have personally obtained a history, examined the patient, evaluated laboratory and imaging results, formulated the assessment and plan and placed orders.    Yevonne Pax, MD [facility] Pulmonary and Critical Care Sleep medicine

## 2018-03-05 NOTE — Procedures (Signed)
[facility] [address] [place], [place] [address]  Patient Name: Steven [name] DOB: 1980/11/01   SLEEP STUDY INTERPRETATION  DATE OF SERVICE: February 13, 2018   SLEEP STUDY HISTORY: This patient is referred to the sleep lab for a baseline Polysomnography. Pertinent history includes a history of diagnosis of excessive daytime somnolence and snoring.  PROCEDURE: This overnight polysomnogram was performed using the Alice 5 acquisition system using the standard diagnostic protocol as outlined by the AASM. This includes 6 channels of EEG, 2 channelscannels of EOG, chin EMG, bilateral anterior tibialis EMG, nasal/oral thermister, PTAF, chest and abdominal wall movements, ECG and pulse oximetry. Apneas and Hypopneas were scored per AASM definition.  SLEEP ARCHITECHTURE: This is a baseline polysomnograph  study. The total recording time was  264 minutes and the patients total sleep time is noted to be  207 minutes. Sleep onset latency was  31 minutes and is  prolonged.  Stage R sleep onset latency was  109 minutes. Sleep maintenance efficiency was  78.4% and is decreased.  Sleep staging expressed as a percentage of total sleep time demonstrated  12.3% N1,  54.6% N2 and  13.3% N3  sleep. Stage R represents  19.8% of total sleep time. This is  reduce.  There were a total of  2 arousals  for an overall arousal index of  0.6 per hour of sleep. PLMS arousal  not noted. Arousals without respiratory events are  noted. This can contribute to sleep architechture disruption.  RESPIRATORY MONITORING:   Patient exhibited insignificant sleep disorderd breathing characterized by  0 central apneas,  0 obstructive apneas and  0 mixed apneas. There were  2 obstructive hypopneas and  1 RERAs. Most of the apneas/hypopneas were of  obstructive variety. The total apnea hypopnea index (apneas and hypopneas per hour of sleep) is  0.6 respiratory events per hour and is  Within normal limits.  Respiratory  monitoring demonstrated  moderate snoring through the night. There are a total of  567 snoring episodes representing  28.3% of sleep.   Baseline oxygen saturation during wakefulness was  96% and during NREM sleep averaged  95% through the night. Arterial saturation during REM sleep was  95% through the night. There borderline significant  oxygen desaturation with the respiratory events. Arterial oxygen desaturation occurred of at least 4%  was noted with a low saturation of  90%. The study was performed  off oxygen.  CARDIAC MONITORING:   Average heart rate is  66 during sleep with a high of  75 beats per minute. Malignant arrhythmias  Were not noted.    IMPRESSIONS:  --This overnight polysomnogram demonstrates  Absence of significant obstructive sleep apnea with an overall AHI  0.6 per hour. --The overall AHI was  somewhat worse  during Stage REM. --There were associated  minimal arterial oxygen desaturations noted  With a low of 90% --There  Was no significant PLMS noted in this study. --There is  moderate snoring noted throughout the study.    RECOMMENDATIONS:  --CPAP titration study is  Not clearly indicated in this case. --Nasal decongestants and antihistamines may be of help for increased upper airways resistance when present. --Weight loss through dietary and lifestyle modification is recommended in the presence of obesity. --A search for and treatment of any underlying cardiopulmonary disease is      recommended in the presence of oxygen desaturations. --Alternative treatment options if the patient is not willing to use CPAP include oral  appliances as well as surgical intervention which may help in the appropriate patient. --Clinical correlation is recommended. Please feel free to call the office for any further  questions or assistance in the care of this patient.     Yevonne Pax, MD [facility] Pulmonary Critical Care Medicine Sleep medicine

## 2018-03-15 ENCOUNTER — Telehealth: Payer: Self-pay

## 2018-03-15 NOTE — Telephone Encounter (Signed)
Faxed pulmonary clearance over to [facility] on 03/15/18

## 2018-03-20 ENCOUNTER — Other Ambulatory Visit: Payer: Self-pay

## 2018-03-20 ENCOUNTER — Emergency Department (HOSPITAL_COMMUNITY)
Admission: EM | Admit: 2018-03-20 | Discharge: 2018-03-21 | Disposition: A | Discharge status: Home / Self Care | Payer: Medicare Other | Attending: Emergency Medicine | Admitting: Emergency Medicine

## 2018-03-20 ENCOUNTER — Encounter (HOSPITAL_COMMUNITY): Payer: Self-pay | Admitting: Emergency Medicine

## 2018-03-20 DIAGNOSIS — Y999 Unspecified external cause status: Secondary | ICD-10-CM | POA: Diagnosis not present

## 2018-03-20 DIAGNOSIS — Y939 Activity, unspecified: Secondary | ICD-10-CM | POA: Diagnosis not present

## 2018-03-20 DIAGNOSIS — W57XXXA Bitten or stung by nonvenomous insect and other nonvenomous arthropods, initial encounter: Secondary | ICD-10-CM | POA: Diagnosis not present

## 2018-03-20 DIAGNOSIS — L089 Local infection of the skin and subcutaneous tissue, unspecified: Secondary | ICD-10-CM | POA: Insufficient documentation

## 2018-03-20 DIAGNOSIS — J45909 Unspecified asthma, uncomplicated: Secondary | ICD-10-CM | POA: Diagnosis not present

## 2018-03-20 DIAGNOSIS — Z79899 Other long term (current) drug therapy: Secondary | ICD-10-CM | POA: Insufficient documentation

## 2018-03-20 DIAGNOSIS — S70362A Insect bite (nonvenomous), left thigh, initial encounter: Secondary | ICD-10-CM | POA: Diagnosis not present

## 2018-03-20 DIAGNOSIS — S79922A Unspecified injury of left thigh, initial encounter: Secondary | ICD-10-CM | POA: Diagnosis present

## 2018-03-20 DIAGNOSIS — Y929 Unspecified place or not applicable: Secondary | ICD-10-CM | POA: Insufficient documentation

## 2018-03-20 DIAGNOSIS — Z87891 Personal history of nicotine dependence: Secondary | ICD-10-CM | POA: Diagnosis not present

## 2018-03-20 MED ORDER — DOXYCYCLINE HYCLATE 100 MG PO CAPS: 100.0000 mg | ORAL_CAPSULE | Freq: Two times a day (BID) | ORAL | 0 refills | Status: DC

## 2018-03-20 MED ORDER — DOXYCYCLINE HYCLATE 100 MG PO TABS
100.0000 mg | ORAL_TABLET | Freq: Once | ORAL | Status: AC
  Administered 2018-03-20: 100 mg via ORAL
  Filled 2018-03-20: qty 1

## 2018-03-20 NOTE — ED Provider Notes (Signed)
[facility] EMERGENCY DEPARTMENT Provider Note   CSN: [id number] Arrival date & time: 03/20/18  2222     History   Chief Complaint Chief Complaint  Patient presents with  . Tick Removal    HPI Steven [name] is a 37 y.o. male.  The history is provided by the patient.  He has history of COPD, depression, pulmonary fibrosis and comes in complaining of a tick bite on his left leg.  He states he pulled a tick off of his left thigh about 3 days ago, and the area just feels irritated and has turned become red.  He rates pain at 4/10.  He denies fever or chills.  Denies any arthralgias or myalgias.  He denies any headache.  His wife is concerned because she thought the head of the tick might be still embedded.  Past Medical History:  Diagnosis Date  . Anxiety   . Arthritis    knee  . Asthma   . Chronic mental illness   . COPD (chronic obstructive pulmonary disease) (HCC)   . Depression   . GERD (gastroesophageal reflux disease)   . Heart palpitations   . Pulmonary fibrosis [facility])     Patient Active Problem List   Diagnosis Date Noted  . Asthma, chronic 04/05/2015  . Chronic obstructive airway disease with asthma (HCC) 03/17/2015  . Obesity 03/17/2015  . DOE (dyspnea on exertion) 03/17/2015  . Environmental allergies 04/03/2014  . Chest pain 08/02/2013  . Thyroid nodule 03/21/2013    Past Surgical History:  Procedure Laterality Date  . ESOPHAGOGASTRODUODENOSCOPY (EGD) WITH PROPOFOL N/A 09/05/2017   Procedure: ESOPHAGOGASTRODUODENOSCOPY (EGD) WITH PROPOFOL;  Surgeon: Toledo, Boykin Nearing, MD;  Location: [facility] ENDOSCOPY;  Service: Gastroenterology;  Laterality: N/A;  . none    . TUBES IN EARS          Home Medications    Prior to Admission medications   Medication Sig Start Date End Date Taking? Authorizing Provider  acetaminophen (TYLENOL) 500 MG tablet Take 1,000 mg by mouth every 6 (six) hours as needed for moderate pain.   Yes [provider]    albuterol (PROVENTIL) (2.5 MG/3ML) 0.083% nebulizer solution Inhale 2.5 mg into the lungs every 6 (six) hours as needed for wheezing or shortness of breath.  09/19/13  Yes [provider]  ATROVENT HFA 17 MCG/ACT inhaler Inhale 2 puffs into the lungs every 6 (six) hours as needed for wheezing.  03/14/18  Yes [provider]  BREO ELLIPTA 100-25 MCG/INH AEPB Inhale 1 puff into the lungs daily.  03/14/18  Yes [provider]  clonazePAM (KLONOPIN) 1 MG tablet Take 1 mg by mouth 3 (three) times daily as needed for anxiety.    Yes [provider]  COMBIVENT RESPIMAT 20-100 MCG/ACT AERS respimat Inhale 1 puff into the lungs every 6 (six) hours as needed for wheezing.  01/20/16  Yes [provider]  EPIPEN 2-PAK 0.3 MG/0.3ML SOAJ injection 0.3 mg.  12/03/15  Yes [provider]  escitalopram (LEXAPRO) 20 MG tablet Take 20 mg by mouth daily. 02/18/15  Yes [provider]  fexofenadine (ALLEGRA) 180 MG tablet Take 180 mg by mouth daily.   Yes [provider]  fluticasone (FLONASE) 50 MCG/ACT nasal spray Place 1 spray into both nostrils daily. 06/23/15  Yes [provider]  Multiple Vitamin (MULTIVITAMIN) tablet Take 2 tablets by mouth daily.    Yes [provider]  omeprazole (PRILOSEC) 20 MG capsule Take 20 mg by mouth  daily.   Yes [provider]  PROAIR HFA 108 (90 Base) MCG/ACT inhaler INAHLE 2 PUFFS BY MOUTH EVERY 4 TO 6 HOURS AS NEEDED 09/15/16  Yes Mungal, Vishal, MD    Family History Family History  Problem Relation Age of Onset  . COPD Mother   . Asthma Mother   . Mental illness Mother   . COPD Father   . Pulmonary fibrosis Father   . Lung cancer Father     Social History Social History   Tobacco Use  . Smoking status: Former Smoker    Packs/day: 0.50    Years: 2.00    Pack years: 1.00    Types: Cigarettes    Last attempt to quit: 10/31/2000    Years since quitting: 17.3  . Smokeless  tobacco: Current User    Types: Chew  . Tobacco comment: 2003  Substance Use Topics  . Alcohol use: No    Alcohol/week: 0.0 oz  . Drug use: No     Allergies   Diphenhydramine; Guaifenesin; and Amoxicillin   Review of Systems Review of Systems  All other systems reviewed and are negative.    Physical Exam Updated Vital Signs BP 125/70   Pulse (!) 105   Temp 98.4 F (36.9 C) (Oral)   Resp 18   Ht  (1.702 m)   Wt 125.2 kg (276 lb)   SpO2 99%   BMI 43.23 kg/m   Physical Exam  Nursing note and vitals reviewed.  37 year old male, resting comfortably and in no acute distress. Vital signs are significant for mildly elevated heart rate. Oxygen saturation is 99%, which is normal. Head is normocephalic and atraumatic. PERRLA, EOMI. Oropharynx is clear. Neck is nontender and supple without adenopathy or JVD. Back is nontender and there is no CVA tenderness. Lungs are clear without rales, wheezes, or rhonchi. Chest is nontender. Heart has regular rate and rhythm without murmur. Abdomen is soft, flat, nontender without masses or hepatosplenomegaly and peristalsis is normoactive. Extremities have no cyanosis or edema, full range of motion is present.  Tick bite site identified in the left medial thigh.  No obvious tick mouth parts present.  Faint area of erythema around the bite, but pattern not typical of erythema migrans. Skin is warm and dry without other rash. Neurologic: Mental status is normal, cranial nerves are intact, there are no motor or sensory deficits.  ED Treatments / Results   Procedures Procedures  Medications Ordered in ED Medications  doxycycline (VIBRA-TABS) tablet 100 mg (has no administration in time range)     Initial Impression / Assessment and Plan / ED Course  I have reviewed the triage vital signs and the nursing notes.  Tick bite left thigh with area of erythema.  Consider possible erythema migrans and early Lyme disease.  Also consider  low-grade cellulitis.  He is discharged with prescription for a 2-week course of doxycycline.  This should get adequate coverage for both [facility] spotted fever and Lyme disease as well as appropriate coverage for cellulitis.  Return precautions discussed.  Final Clinical Impressions(s) / ED Diagnoses   Final diagnoses:  Tick bite of left thigh with infection, initial encounter    ED Discharge Orders        Ordered    doxycycline (VIBRAMYCIN) 100 MG capsule  2 times daily     03/20/18 2351       Dione Booze, MD 03/20/18 2358

## 2018-03-20 NOTE — ED Triage Notes (Signed)
Tick bite 2 days ago to his L inner upper thigh  Has not treated   Now with red, hot and per pt's wife appears the head remains in the bite

## 2018-03-20 NOTE — Discharge Instructions (Addendum)
Return if you start running a fever, or if the rash seems to be spreading.

## 2018-03-21 NOTE — ED Notes (Signed)
Pt ambulatory to waiting room. Pt verbalized understanding of discharge instructions.   

## 2018-04-03 ENCOUNTER — Encounter: Payer: Medicare Other | Admitting: Urology

## 2018-04-16 ENCOUNTER — Emergency Department (HOSPITAL_COMMUNITY): Payer: Medicare Other

## 2018-04-16 ENCOUNTER — Other Ambulatory Visit: Payer: Self-pay

## 2018-04-16 ENCOUNTER — Emergency Department (HOSPITAL_COMMUNITY)
Admission: EM | Admit: 2018-04-16 | Discharge: 2018-04-16 | Disposition: A | Discharge status: Home / Self Care | Payer: Medicare Other | Attending: Emergency Medicine | Admitting: Emergency Medicine

## 2018-04-16 ENCOUNTER — Encounter (HOSPITAL_COMMUNITY): Payer: Self-pay | Admitting: *Deleted

## 2018-04-16 DIAGNOSIS — J449 Chronic obstructive pulmonary disease, unspecified: Secondary | ICD-10-CM | POA: Insufficient documentation

## 2018-04-16 DIAGNOSIS — W269XXA Contact with unspecified sharp object(s), initial encounter: Secondary | ICD-10-CM | POA: Insufficient documentation

## 2018-04-16 DIAGNOSIS — Z87891 Personal history of nicotine dependence: Secondary | ICD-10-CM | POA: Insufficient documentation

## 2018-04-16 DIAGNOSIS — Y929 Unspecified place or not applicable: Secondary | ICD-10-CM | POA: Insufficient documentation

## 2018-04-16 DIAGNOSIS — S61412A Laceration without foreign body of left hand, initial encounter: Secondary | ICD-10-CM | POA: Insufficient documentation

## 2018-04-16 DIAGNOSIS — Y999 Unspecified external cause status: Secondary | ICD-10-CM | POA: Insufficient documentation

## 2018-04-16 DIAGNOSIS — Z23 Encounter for immunization: Secondary | ICD-10-CM | POA: Insufficient documentation

## 2018-04-16 DIAGNOSIS — Y9389 Activity, other specified: Secondary | ICD-10-CM | POA: Insufficient documentation

## 2018-04-16 HISTORY — DX: Panic disorder (episodic paroxysmal anxiety): F41.0

## 2018-04-16 MED ORDER — BACITRACIN ZINC 500 UNIT/GM EX OINT
TOPICAL_OINTMENT | Freq: Once | CUTANEOUS | Status: AC
  Administered 2018-04-16: 06:00:00 via TOPICAL

## 2018-04-16 MED ORDER — LIDOCAINE-EPINEPHRINE-TETRACAINE (LET) SOLUTION: 3.0000 mL | Freq: Once | NASAL | Status: DC

## 2018-04-16 MED ORDER — OXYCODONE-ACETAMINOPHEN 5-325 MG PO TABS
1.0000 | ORAL_TABLET | Freq: Once | ORAL | Status: AC
  Administered 2018-04-16: 1 via ORAL
  Filled 2018-04-16 (×2): qty 1

## 2018-04-16 MED ORDER — LIDOCAINE HCL (PF) 1 % IJ SOLN
30.0000 mL | Freq: Once | INTRAMUSCULAR | Status: DC
  Filled 2018-04-16: qty 30

## 2018-04-16 MED ORDER — OXYCODONE-ACETAMINOPHEN 5-325 MG PO TABS: 1.0000 | ORAL_TABLET | ORAL | 0 refills | Status: DC | PRN

## 2018-04-16 MED ORDER — IBUPROFEN 400 MG PO TABS: 400.0000 mg | ORAL_TABLET | Freq: Four times a day (QID) | ORAL | 0 refills | Status: DC | PRN

## 2018-04-16 MED ORDER — POVIDONE-IODINE 10 % EX SOLN
CUTANEOUS | Status: AC
  Administered 2018-04-16: 06:00:00
  Filled 2018-04-16: qty 30

## 2018-04-16 MED ORDER — BACITRACIN ZINC 500 UNIT/GM EX OINT
TOPICAL_OINTMENT | CUTANEOUS | Status: AC
  Filled 2018-04-16: qty 0.9

## 2018-04-16 MED ORDER — BACITRACIN-NEOMYCIN-POLYMYXIN 400-5-5000 EX OINT: 1.0000 "application " | TOPICAL_OINTMENT | Freq: Two times a day (BID) | CUTANEOUS | 0 refills | Status: DC

## 2018-04-16 MED ORDER — LIDOCAINE HCL (PF) 2 % IJ SOLN
10.0000 mL | Freq: Once | INTRAMUSCULAR | Status: AC
  Administered 2018-04-16: 10 mL via INTRADERMAL

## 2018-04-16 MED ORDER — TETANUS-DIPHTH-ACELL PERTUSSIS 5-2.5-18.5 LF-MCG/0.5 IM SUSP
0.5000 mL | Freq: Once | INTRAMUSCULAR | Status: AC
  Administered 2018-04-16: 0.5 mL via INTRAMUSCULAR
  Filled 2018-04-16: qty 0.5

## 2018-04-16 MED ORDER — LIDOCAINE HCL (PF) 2 % IJ SOLN
INTRAMUSCULAR | Status: AC
  Administered 2018-04-16: 10 mL via INTRADERMAL
  Filled 2018-04-16: qty 20

## 2018-04-16 NOTE — ED Provider Notes (Signed)
Emergency Department Provider Note   I have reviewed the triage vital signs and the nursing notes.   HISTORY  Chief Complaint Laceration   HPI Steven [name] is a 37 y.o. male who was cutting tile around 9:30 in the evening on June 16 when he cut his left thenar eminence.  Cannot get the bleeding to stop so came here for evaluation at this time.  Unsure about retained foreign bodies and also unsure about tetanus status. No other associated or modifying symptoms.    Past Medical History:  Diagnosis Date  . Anxiety   . Arthritis    knee  . Asthma   . Chronic mental illness   . COPD (chronic obstructive pulmonary disease) (HCC)   . Depression   . GERD (gastroesophageal reflux disease)   . Heart palpitations   . Panic attack   . Pulmonary fibrosis [facility])     Patient Active Problem List   Diagnosis Date Noted  . Asthma, chronic 04/05/2015  . Chronic obstructive airway disease with asthma (HCC) 03/17/2015  . Obesity 03/17/2015  . DOE (dyspnea on exertion) 03/17/2015  . Environmental allergies 04/03/2014  . Chest pain 08/02/2013  . Thyroid nodule 03/21/2013    Past Surgical History:  Procedure Laterality Date  . ESOPHAGOGASTRODUODENOSCOPY (EGD) WITH PROPOFOL N/A 09/05/2017   Procedure: ESOPHAGOGASTRODUODENOSCOPY (EGD) WITH PROPOFOL;  Surgeon: Toledo, Boykin Nearing, MD;  Location: [facility] ENDOSCOPY;  Service: Gastroenterology;  Laterality: N/A;  . none    . TUBES IN EARS      Current Outpatient Rx  . Order #: [id number] Class: Historical Med  . Order #: [id number] Class: Historical Med  . Order #: [id number] Class: Historical Med  . Order #: [id number] Class: Historical Med  . Order #: [id number] Class: Historical Med  . Order #: [id number] Class: Historical Med  . Order #: [id number] Class: Print  . Order #: [id number] Class: Historical Med  . Order #: [id number] Class: Historical Med  . Order #: [id number] Class: Historical Med  . Order #: [id number] Class: Historical Med  . Order #:  [id number] Class: Print  . Order #: [id number] Class: Historical Med  . Order #: [id number] Class: Print  . Order #: [id number] Class: Historical Med  . Order #: [id number] Class: Print  . Order #: [id number] Class: Normal    Allergies Diphenhydramine; Guaifenesin; Doxycycline; and Amoxicillin  Family History  Problem Relation Age of Onset  . COPD Mother   . Asthma Mother   . Mental illness Mother   . COPD Father   . Pulmonary fibrosis Father   . Lung cancer Father     Social History Social History   Tobacco Use  . Smoking status: Former Smoker    Packs/day: 0.50    Years: 2.00    Pack years: 1.00    Types: Cigarettes    Last attempt to quit: 10/31/2000    Years since quitting: 17.4  . Smokeless tobacco: Current User    Types: Chew  . Tobacco comment: 2003  Substance Use Topics  . Alcohol use: No    Alcohol/week: 0.0 oz  . Drug use: No    Review of Systems  All other systems negative except as documented in the HPI. All pertinent positives and negatives as reviewed in the HPI. ____________________________________________   PHYSICAL EXAM:  VITAL SIGNS: ED Triage Vitals  Enc Vitals Group     BP 04/16/18 0303 (!) 146/82     Pulse Rate 04/16/18 0303 86     Resp 04/16/18 0303 20     Temp 04/16/18 0303 98.1 F (36.7 C)  Temp Source 04/16/18 0303 Oral     SpO2 04/16/18 0303 97 %     Weight 04/16/18 0300 276 lb (125.2 kg)     Height 04/16/18 0300 5\' 7"  (1.702 m)     Head Circumference --      Peak Flow --      Pain Score 04/16/18 0300 2     Pain Loc --      Pain Edu? --      Excl. in GC? --     Constitutional: Alert and oriented. Well appearing and in no acute distress. Eyes: Conjunctivae are normal. PERRL. EOMI. Head: Atraumatic. Nose: No congestion/rhinnorhea. Mouth/Throat: Mucous membranes are moist.  Oropharynx non-erythematous. Neck: No stridor.  No meningeal signs.   Cardiovascular: Normal rate, regular rhythm. Good peripheral circulation. Grossly normal  heart sounds.   Respiratory: Normal respiratory effort.  No retractions. Lungs CTAB. Gastrointestinal: Soft and nontender. No distention.  Musculoskeletal: No lower extremity tenderness nor edema. No gross deformities of extremities. Neurologic:  Normal speech and language. No gross focal neurologic deficits are appreciated.  Skin:  Crescent shaped avulsion and laceration to left volar hand on thenar eminence  ____________________________________________  RADIOLOGY  Dg Hand Complete Left  Result Date: 04/16/2018 CLINICAL DATA:  Laceration to the left hand near the thumb area with a razor blade last night around 2130 hours. EXAM: LEFT HAND - COMPLETE 3+ VIEW COMPARISON:  None. FINDINGS: There is no evidence of fracture or dislocation. There is no evidence of arthropathy or other focal bone abnormality. Soft tissues are unremarkable. No radiopaque soft tissue foreign bodies or gas collections identified. IMPRESSION: No acute bony abnormalities. No radiopaque soft tissue foreign bodies. Electronically Signed   By: Burman Nieves M.D.   On: 04/16/2018 04:54    ____________________________________________   PROCEDURES  Procedure(s) performed:   Marland KitchenMarland KitchenLaceration Repair Date/Time: 04/16/2018 5:37 AM Performed by: Marily Memos, MD Authorized by: Marily Memos, MD   Consent:    Consent obtained:  Verbal   Consent given by:  Patient   Risks discussed:  Infection, need for additional repair, nerve damage, poor wound healing and poor cosmetic result   Alternatives discussed:  No treatment and delayed treatment Anesthesia (see MAR for exact dosages):    Anesthesia method:  Local infiltration   Local anesthetic:  Lidocaine 2% w/o epi Laceration details:    Location:  Hand   Hand location:  L palm   Length (cm):  3   Depth (mm):  5 Repair type:    Repair type:  Simple Pre-procedure details:    Preparation:  Patient was prepped and draped in usual sterile fashion and imaging obtained to  evaluate for foreign bodies Exploration:    Hemostasis achieved with:  Direct pressure   Wound exploration: wound explored through full range of motion     Wound extent: no foreign bodies/material noted, no muscle damage noted, no nerve damage noted and no tendon damage noted     Contaminated: no   Treatment:    Area cleansed with:  Betadine and saline   Amount of cleaning:  Standard   Irrigation solution:  Sterile saline   Irrigation volume:  100   Irrigation method: bulb.   Visualized foreign bodies/material removed: no   Skin repair:    Repair method:  Sutures   Suture size:  3-0   Suture material:  Prolene   Suture technique:  Simple interrupted   Number of sutures:  3 Approximation:    Approximation:  Close Post-procedure  details:    Dressing:  Antibiotic ointment and non-adherent dressing   Patient tolerance of procedure:  Tolerated well, no immediate complications     ____________________________________________   INITIAL IMPRESSION / ASSESSMENT AND PLAN / ED COURSE  xr for foreign body/fracture. tdap updated. Will suture and wound care after xr.   Wound repaired. NVI afterwards. Stable for discharge at this time.   Cleaned, bandage applied. Return in 10-14 days for wound check and suture removal.  Pertinent labs & imaging results that were available during my care of the patient were reviewed by me and considered in my medical decision making (see chart for details).  ____________________________________________  FINAL CLINICAL IMPRESSION(S) / ED DIAGNOSES  Final diagnoses:  Laceration of left hand without foreign body, initial encounter     MEDICATIONS GIVEN DURING THIS VISIT:  Medications  lidocaine-EPINEPHrine-tetracaine (LET) solution (3 mLs Topical Refused 04/16/18 0402)  Tdap (BOOSTRIX) injection 0.5 mL (0.5 mLs Intramuscular Given 04/16/18 0329)  oxyCODONE-acetaminophen (PERCOCET/ROXICET) 5-325 MG per tablet 1 tablet (1 tablet Oral Given 04/16/18  0412)  povidone-iodine (BETADINE) 10 % external solution (  Given 04/16/18 0542)  lidocaine (XYLOCAINE) 2 % injection 10 mL (10 mLs Intradermal Given 04/16/18 0542)  bacitracin ointment ( Topical Given 04/16/18 0542)     NEW OUTPATIENT MEDICATIONS STARTED DURING THIS VISIT:  Discharge Medication List as of 04/16/2018  5:33 AM    START taking these medications   Details  ibuprofen (ADVIL,MOTRIN) 400 MG tablet Take 1 tablet (400 mg total) by mouth every 6 (six) hours as needed., Starting Mon 04/16/2018, Print    neomycin-bacitracin-polymyxin (NEOSPORIN) ointment Apply 1 application topically every 12 (twelve) hours., Starting Mon 04/16/2018, Print    oxyCODONE-acetaminophen (PERCOCET/ROXICET) 5-325 MG tablet Take 1-2 tablets by mouth every 4 (four) hours as needed for severe pain., Starting Mon 04/16/2018, Print        Note:  This note was prepared with assistance of Dragon voice recognition software. Occasional wrong-word or sound-a-like substitutions may have occurred due to the inherent limitations of voice recognition software.   Marily MemosMesner, Jaqualyn Juday, MD 04/16/18 [phone]

## 2018-04-16 NOTE — ED Triage Notes (Signed)
Pt c/o laceration to left hand area near thumb with razor blade while cutting tile last night at around 21:30

## 2018-04-17 MED FILL — Oxycodone w/ Acetaminophen Tab 5-325 MG: ORAL | Qty: 6 | Status: AC

## 2018-04-18 ENCOUNTER — Inpatient Hospital Stay: Admission: RE | Admit: 2018-04-18 | Payer: Medicare Other | Source: Ambulatory Visit

## 2018-04-23 ENCOUNTER — Ambulatory Visit: Payer: Self-pay | Admitting: Internal Medicine

## 2018-04-26 ENCOUNTER — Ambulatory Visit: Admission: RE | Admit: 2018-04-26 | Payer: Medicare Other | Source: Ambulatory Visit | Admitting: Otolaryngology

## 2018-04-26 ENCOUNTER — Encounter: Admission: RE | Payer: Self-pay | Source: Ambulatory Visit

## 2018-04-26 SURGERY — SEPTOPLASTY, NOSE, WITH NASAL TURBINATE REDUCTION
Anesthesia: General | Laterality: Bilateral

## 2018-05-21 ENCOUNTER — Encounter (HOSPITAL_COMMUNITY): Payer: Self-pay | Admitting: Emergency Medicine

## 2018-05-21 ENCOUNTER — Other Ambulatory Visit: Payer: Self-pay

## 2018-05-21 ENCOUNTER — Emergency Department (HOSPITAL_COMMUNITY): Payer: Medicare Other

## 2018-05-21 ENCOUNTER — Emergency Department (HOSPITAL_COMMUNITY)
Admission: EM | Admit: 2018-05-21 | Discharge: 2018-05-21 | Disposition: A | Discharge status: Home / Self Care | Payer: Medicare Other | Attending: Emergency Medicine | Admitting: Emergency Medicine

## 2018-05-21 DIAGNOSIS — Z79899 Other long term (current) drug therapy: Secondary | ICD-10-CM | POA: Insufficient documentation

## 2018-05-21 DIAGNOSIS — Z87891 Personal history of nicotine dependence: Secondary | ICD-10-CM | POA: Insufficient documentation

## 2018-05-21 DIAGNOSIS — R079 Chest pain, unspecified: Secondary | ICD-10-CM | POA: Insufficient documentation

## 2018-05-21 DIAGNOSIS — J45998 Other asthma: Secondary | ICD-10-CM | POA: Insufficient documentation

## 2018-05-21 LAB — CBC
HCT: 46.3 % (ref 39.0–52.0)
Hemoglobin: 16.2 g/dL (ref 13.0–17.0)
MCH: 30 pg (ref 26.0–34.0)
MCHC: 35 g/dL (ref 30.0–36.0)
MCV: 85.7 fL (ref 78.0–100.0)
Platelets: 336 10*3/uL (ref 150–400)
RBC: 5.4 MIL/uL (ref 4.22–5.81)
RDW: 12.2 % (ref 11.5–15.5)
WBC: 10 10*3/uL (ref 4.0–10.5)

## 2018-05-21 LAB — BASIC METABOLIC PANEL
Anion gap: 9 (ref 5–15)
BUN: <5 mg/dL — ABNORMAL LOW (ref 6–20)
CO2: 25 mmol/L (ref 22–32)
Calcium: 9.2 mg/dL (ref 8.9–10.3)
Chloride: 105 mmol/L (ref 98–111)
Creatinine, Ser: 0.88 mg/dL (ref 0.61–1.24)
GFR calc Af Amer: >60 mL/min (ref >60)
GFR calc non Af Amer: >60 mL/min (ref >60)
Glucose, Bld: 120 mg/dL — ABNORMAL HIGH (ref 70–99)
Potassium: 3.4 mmol/L — ABNORMAL LOW (ref 3.5–5.1)
Sodium: 139 mmol/L (ref 135–145)

## 2018-05-21 LAB — D-DIMER, QUANTITATIVE: D-Dimer, Quant: <0.27 ug/mL-FEU (ref 0.00–0.50)

## 2018-05-21 LAB — TROPONIN I: Troponin I: <0.03 ng/mL (ref <0.03)

## 2018-05-21 MED ORDER — MELOXICAM 7.5 MG PO TABS: 7.5000 mg | ORAL_TABLET | Freq: Every day | ORAL | 0 refills | Status: DC | PRN

## 2018-05-21 NOTE — ED Triage Notes (Signed)
Pt c/o central, non-radiating sharp,stabbing chest pain and SOB x 3 weeks. Pt states he initially thought it was his anxiety.

## 2018-05-21 NOTE — ED Provider Notes (Signed)
[facility] EMERGENCY DEPARTMENT Provider Note   CSN: [id number] Arrival date & time: 05/21/18  1559     History   Chief Complaint Chief Complaint  Patient presents with  . Chest Pain    HPI Steven PomfretRandy L [name] is a 37 y.o. male.  HPI   37 year old male with chest pain.  Patient describes a sharp "pricking" sensation left side of his chest.  It has been intermittent for the past 3 weeks.  Episodes last a few seconds up to may be admitted.  He initially felt that his symptoms may be due to anxiety but became concerned when they started last this long.  He reports some shortness of breath which is worse with exertion.  Denies any associated nausea or diaphoresis.  His pain is not necessarily worse with exertion.  No unusual leg pain or swelling.  No known cardiac history.  Past Medical History:  Diagnosis Date  . Anxiety   . Arthritis    knee  . Asthma   . Chronic mental illness   . COPD (chronic obstructive pulmonary disease) (HCC)   . Depression   . GERD (gastroesophageal reflux disease)   . Heart palpitations   . Panic attack   . Pulmonary fibrosis [facility](HCC)     Patient Active Problem List   Diagnosis Date Noted  . Asthma, chronic 04/05/2015  . Chronic obstructive airway disease with asthma (HCC) 03/17/2015  . Obesity 03/17/2015  . DOE (dyspnea on exertion) 03/17/2015  . Environmental allergies 04/03/2014  . Chest pain 08/02/2013  . Thyroid nodule 03/21/2013    Past Surgical History:  Procedure Laterality Date  . ESOPHAGOGASTRODUODENOSCOPY (EGD) WITH PROPOFOL N/A 09/05/2017   Procedure: ESOPHAGOGASTRODUODENOSCOPY (EGD) WITH PROPOFOL;  Surgeon: Toledo, Boykin Nearingeodoro K, MD;  Location: [facility] ENDOSCOPY;  Service: Gastroenterology;  Laterality: N/A;  . none    . TUBES IN EARS          Home Medications    Prior to Admission medications   Medication Sig Start Date End Date Taking? Authorizing Provider  acetaminophen (TYLENOL) 500 MG tablet Take 1,000 mg by mouth every 6  (six) hours as needed for moderate pain.   Yes [provider]  albuterol (PROVENTIL) (2.5 MG/3ML) 0.083% nebulizer solution Inhale 2.5 mg into the lungs every 6 (six) hours as needed for wheezing or shortness of breath.  09/19/13  Yes [provider]  ATROVENT HFA 17 MCG/ACT inhaler Inhale 2 puffs into the lungs every 6 (six) hours as needed for wheezing.  03/14/18  Yes [provider]  BREO ELLIPTA 100-25 MCG/INH AEPB Inhale 1 puff into the lungs daily.  03/14/18  Yes [provider]  clonazePAM (KLONOPIN) 1 MG tablet Take 1 mg by mouth 3 (three) times daily as needed for anxiety.    Yes [provider]  COMBIVENT RESPIMAT 20-100 MCG/ACT AERS respimat Inhale 1 puff into the lungs every 6 (six) hours as needed for wheezing.  01/20/16  Yes [provider]  EPIPEN 2-PAK 0.3 MG/0.3ML SOAJ injection 0.3 mg.  12/03/15  Yes [provider]  escitalopram (LEXAPRO) 20 MG tablet Take 20 mg by mouth daily. 02/18/15  Yes [provider]  fexofenadine (ALLEGRA) 180 MG tablet Take 180 mg by mouth daily as needed for allergies.    Yes [provider]  fluticasone (FLONASE) 50 MCG/ACT nasal spray Place 1 spray into both nostrils daily. 06/23/15  Yes [provider]  omeprazole (PRILOSEC) 20 MG capsule Take 20 mg by mouth 2 (two) times  daily before a meal.    Yes [provider]  PROAIR HFA 108 (90 Base) MCG/ACT inhaler INAHLE 2 PUFFS BY MOUTH EVERY 4 TO 6 HOURS AS NEEDED 09/15/16  Yes Mungal, Vishal, MD  doxycycline (VIBRAMYCIN) 100 MG capsule Take 1 capsule (100 mg total) by mouth 2 (two) times daily. Patient not taking: Reported on 05/21/2018 03/20/18   Dione Booze, MD  ibuprofen (ADVIL,MOTRIN) 400 MG tablet Take 1 tablet (400 mg total) by mouth every 6 (six) hours as needed. Patient not taking: Reported on 05/21/2018 04/16/18   Mesner, Barbara Cower, MD  neomycin-bacitracin-polymyxin (NEOSPORIN) ointment Apply 1 application  topically every 12 (twelve) hours. Patient not taking: Reported on 05/21/2018 04/16/18   Mesner, Barbara Cower, MD  oxyCODONE-acetaminophen (PERCOCET/ROXICET) 5-325 MG tablet Take 1-2 tablets by mouth every 4 (four) hours as needed for severe pain. Patient not taking: Reported on 05/21/2018 04/16/18   Mesner, Barbara Cower, MD    Family History Family History  Problem Relation Age of Onset  . COPD Mother   . Asthma Mother   . Mental illness Mother   . COPD Father   . Pulmonary fibrosis Father   . Lung cancer Father     Social History Social History   Tobacco Use  . Smoking status: Former Smoker    Packs/day: 0.50    Years: 2.00    Pack years: 1.00    Types: Cigarettes    Last attempt to quit: 10/31/2000    Years since quitting: 17.5  . Smokeless tobacco: Current User    Types: Chew  . Tobacco comment: 2003  Substance Use Topics  . Alcohol use: No    Alcohol/week: 0.0 oz  . Drug use: No     Allergies   Diphenhydramine; Guaifenesin; Doxycycline; and Amoxicillin   Review of Systems Review of Systems  All systems reviewed and negative, other than as noted in HPI.  Physical Exam Updated Vital Signs BP 125/87 (BP Location: Left Arm)   Pulse 95   Temp 98 F (36.7 C) (Oral)   Resp 11   Ht 5\' 7"  (1.702 m)   Wt 116.6 kg (257 lb)   SpO2 96%   BMI 40.25 kg/m   Physical Exam  Constitutional: He appears well-developed and well-nourished. No distress.  HENT:  Head: Normocephalic and atraumatic.  Eyes: Conjunctivae are normal. Right eye exhibits no discharge. Left eye exhibits no discharge.  Neck: Neck supple.  Cardiovascular: Normal rate, regular rhythm and normal heart sounds. Exam reveals no gallop and no friction rub.  No murmur heard. Pulmonary/Chest: Effort normal and breath sounds normal. No respiratory distress.  Abdominal: Soft. He exhibits no distension. There is no tenderness.  Musculoskeletal: He exhibits no edema or tenderness.  Neurological: He is alert.  Skin: Skin is  warm and dry.  Psychiatric: He has a normal mood and affect. His behavior is normal. Thought content normal.  Nursing note and vitals reviewed.    ED Treatments / Results  Labs (all labs ordered are listed, but only abnormal results are displayed) Labs Reviewed  BASIC METABOLIC PANEL - Abnormal; Notable for the following components:      Result Value   Potassium 3.4 (*)    Glucose, Bld 120 (*)    BUN <5 (*)    All other components within normal limits  CBC  TROPONIN I  D-DIMER, QUANTITATIVE (NOT AT [facility])    EKG None  Radiology Dg Chest 2 View  Result Date: 05/21/2018 CLINICAL DATA:  Chest pain and shortness of  breath for 3 months. History asthma and COPD. EXAM: CHEST - 2 VIEW COMPARISON:  Chest radiograph July 07, 2017 FINDINGS: Bandlike density LEFT lung base with blunted costophrenic angle, no pleural effusion appreciated on lateral radiograph. Cardiomediastinal silhouette is normal. No pneumothorax. Soft tissue planes and included osseous structures are normal. IMPRESSION: LEFT lung base atelectasis/scar with pleural thickening. Electronically Signed   By: Awilda Metro M.D.   On: 05/21/2018 16:42    Procedures Procedures (including critical care time)  Medications Ordered in ED Medications - No data to display   Initial Impression / Assessment and Plan / ED Course  I have reviewed the triage vital signs and the nursing notes.  Pertinent labs & imaging results that were available during my care of the patient were reviewed by me and considered in my medical decision making (see chart for details).     37 year old male with chest pain.  Sounds atypical for ACS.  Doubt PE, dissection or other emergent process.  Troponin normal.  D-dimer is normal as well.  Chest x-ray with no acute-looking pathology.  EKG is not normal but nonspecific changes.  I doubt emergent process.  Return precautions were discussed.  Outpatient follow-up otherwise.  Final Clinical  Impressions(s) / ED Diagnoses   Final diagnoses:  Chest pain, unspecified type    ED Discharge Orders    None       Raeford Razor, MD 05/27/18 [phone]

## 2018-05-25 ENCOUNTER — Emergency Department
Admission: EM | Admit: 2018-05-25 | Discharge: 2018-05-25 | Disposition: A | Discharge status: Home / Self Care | Payer: Medicare Other | Attending: Emergency Medicine | Admitting: Emergency Medicine

## 2018-05-25 ENCOUNTER — Encounter: Payer: Self-pay | Admitting: Emergency Medicine

## 2018-05-25 ENCOUNTER — Emergency Department: Payer: Medicare Other

## 2018-05-25 ENCOUNTER — Other Ambulatory Visit: Payer: Self-pay

## 2018-05-25 DIAGNOSIS — I420 Dilated cardiomyopathy: Secondary | ICD-10-CM | POA: Diagnosis not present

## 2018-05-25 DIAGNOSIS — Z79899 Other long term (current) drug therapy: Secondary | ICD-10-CM | POA: Insufficient documentation

## 2018-05-25 DIAGNOSIS — Z87891 Personal history of nicotine dependence: Secondary | ICD-10-CM | POA: Diagnosis not present

## 2018-05-25 DIAGNOSIS — J449 Chronic obstructive pulmonary disease, unspecified: Secondary | ICD-10-CM | POA: Insufficient documentation

## 2018-05-25 DIAGNOSIS — R002 Palpitations: Secondary | ICD-10-CM

## 2018-05-25 LAB — BASIC METABOLIC PANEL
ANION GAP: 10 (ref 5–15)
BUN: <5 mg/dL — ABNORMAL LOW (ref 6–20)
CALCIUM: 9 mg/dL (ref 8.9–10.3)
CO2: 25 mmol/L (ref 22–32)
Chloride: 102 mmol/L (ref 98–111)
Creatinine, Ser: 0.92 mg/dL (ref 0.61–1.24)
GFR calc non Af Amer: >60 mL/min (ref >60)
Glucose, Bld: 155 mg/dL — ABNORMAL HIGH (ref 70–99)
Potassium: 3.2 mmol/L — ABNORMAL LOW (ref 3.5–5.1)
Sodium: 137 mmol/L (ref 135–145)

## 2018-05-25 LAB — CBC
HCT: 45.6 % (ref 40.0–52.0)
Hemoglobin: 16.2 g/dL (ref 13.0–18.0)
MCH: 30.1 pg (ref 26.0–34.0)
MCHC: 35.4 g/dL (ref 32.0–36.0)
MCV: 84.8 fL (ref 80.0–100.0)
Platelets: 327 10*3/uL (ref 150–440)
RBC: 5.38 MIL/uL (ref 4.40–5.90)
RDW: 12.9 % (ref 11.5–14.5)
WBC: 10.5 10*3/uL (ref 3.8–10.6)

## 2018-05-25 LAB — TROPONIN I: Troponin I: <0.03 ng/mL (ref <0.03)

## 2018-05-25 MED ORDER — METOPROLOL SUCCINATE ER 25 MG PO TB24: 25.0000 mg | ORAL_TABLET | Freq: Every day | ORAL | 0 refills | Status: DC

## 2018-05-25 MED ORDER — METOPROLOL TARTRATE 25 MG PO TABS
25.0000 mg | ORAL_TABLET | Freq: Once | ORAL | Status: AC
  Administered 2018-05-25: 25 mg via ORAL
  Filled 2018-05-25: qty 1

## 2018-05-25 NOTE — ED Triage Notes (Signed)
Pt to ED via POV c/o chest pain, shortness of breath, high HR, and dizziness x 1 month. Pt states that last night he noticed that his HR was elevated. Pt seen at [facility] ED on 7/22 for same symptoms. Pt states that his symptoms have gotten worse since then. Pt is currently in NAD, able to speak in complete sentences.

## 2018-05-25 NOTE — Discharge Instructions (Addendum)
Please make sure you remain well-hydrated and follow-up with cardiology to reestablish care within the next week for reevaluation.  Begin taking your metoprolol once a day as prescribed and return to the emergency department sooner for any concerns whatsoever.  It was a pleasure to take care of you today, and thank you for coming to our emergency department.  If you have any questions or concerns before leaving please ask the nurse to grab me and I'm more than happy to go through your aftercare instructions again.  If you were prescribed any opioid pain medication today such as Norco, Vicodin, Percocet, morphine, hydrocodone, or oxycodone please make sure you do not drive when you are taking this medication as it can alter your ability to drive safely.  If you have any concerns once you are home that you are not improving or are in fact getting worse before you can make it to your follow-up appointment, please do not hesitate to call [phone] and come back for further evaluation.  Merrily BrittleNeil Kelton Bultman, MD  Results for orders placed or performed during the hospital encounter of 05/25/18  Basic metabolic panel  Result Value Ref Range   Sodium 137 135 - 145 mmol/L   Potassium 3.2 (L) 3.5 - 5.1 mmol/L   Chloride 102 98 - 111 mmol/L   CO2 25 22 - 32 mmol/L   Glucose, Bld 155 (H) 70 - 99 mg/dL   BUN <5 (L) 6 - 20 mg/dL   Creatinine, Ser 4.090.92 0.61 - 1.24 mg/dL   Calcium 9.0 8.9 - 81.110.3 mg/dL   GFR calc non Af Amer >60 >60 mL/min   GFR calc Af Amer >60 >60 mL/min   Anion gap 10 5 - 15  CBC  Result Value Ref Range   WBC 10.5 3.8 - 10.6 K/uL   RBC 5.38 4.40 - 5.90 MIL/uL   Hemoglobin 16.2 13.0 - 18.0 g/dL   HCT 91.445.6 78.240.0 - 95.652.0 %   MCV 84.8 80.0 - 100.0 fL   MCH 30.1 26.0 - 34.0 pg   MCHC 35.4 32.0 - 36.0 g/dL   RDW 21.312.9 08.611.5 - 57.814.5 %   Platelets 327 150 - 440 K/uL  Troponin I  Result Value Ref Range   Troponin I <0.03 <0.03 ng/mL   Dg Chest 2 View  Result Date: 05/25/2018 CLINICAL DATA:   37 year old male with chest pain and shortness of breath for 1 month. EXAM: CHEST - 2 VIEW COMPARISON:  05/21/2018 and earlier. FINDINGS: Mildly increased lung volumes. Mild left lung base chronic scarring or atelectasis is less apparent. Mediastinal contours remain normal. Visualized tracheal air column is within normal limits. No pneumothorax, pulmonary edema, pleural effusion or acute pulmonary opacity. No acute osseous abnormality identified. Negative visible bowel gas pattern. IMPRESSION: No acute cardiopulmonary abnormality. Electronically Signed   By: Odessa FlemingH  Hall M.D.   On: 05/25/2018 14:01   Dg Chest 2 View  Result Date: 05/21/2018 CLINICAL DATA:  Chest pain and shortness of breath for 3 months. History asthma and COPD. EXAM: CHEST - 2 VIEW COMPARISON:  Chest radiograph July 07, 2017 FINDINGS: Bandlike density LEFT lung base with blunted costophrenic angle, no pleural effusion appreciated on lateral radiograph. Cardiomediastinal silhouette is normal. No pneumothorax. Soft tissue planes and included osseous structures are normal. IMPRESSION: LEFT lung base atelectasis/scar with pleural thickening. Electronically Signed   By: Awilda Metroourtnay  Bloomer M.D.   On: 05/21/2018 16:42

## 2018-05-25 NOTE — ED Provider Notes (Signed)
[facility] Emergency Department Provider Note  ____________________________________________   First MD Initiated Contact with Patient 05/25/18 1500     (approximate)  I have reviewed the triage vital signs and the nursing notes.   HISTORY  Chief Complaint Chest Pain   HPI Steven [name] is a 37 y.o. male who comes to the emergency department with 1 month of daily palpitations.  Last night he was at home and he felt his heart began to race and he used a home pulse oximeter and noted it was 153.  It lasted for about an hour and a half and then broke on its own.  He wanted to come to the hospital at that point but "I could not reach my phone to call 911".  This morning he had a 30-minute episode that resolved on its own and his friend was able to drive him to the emergency department.  [facility] 4 days ago for a similar thing.  His lab work including a d-dimer were unremarkable and he was discharged home.  On chart review it appears the patient had formerly seen a cardiologist 2 years ago for atypical chest pain and was found to have dilated cardiomyopathy of unclear etiology.  He takes no medications for his heart and he has not followed up.  Nothing seems to make his symptoms better or worse.  They are not positional.  They are not exertional.  He has had intermittent palpitations for quite some time however there and how a daily occurrence which worried him.  He takes no nodal blockers.   His symptoms are intermittent severe and nonradiating.   Past Medical History:  Diagnosis Date  . Anxiety   . Arthritis    knee  . Asthma   . Chronic mental illness   . COPD (chronic obstructive pulmonary disease) (HCC)   . Depression   . GERD (gastroesophageal reflux disease)   . Heart palpitations   . Panic attack   . Pulmonary fibrosis [facility](HCC)     Patient Active Problem List   Diagnosis Date Noted  . Asthma, chronic  04/05/2015  . Chronic obstructive airway disease with asthma (HCC) 03/17/2015  . Obesity 03/17/2015  . DOE (dyspnea on exertion) 03/17/2015  . Environmental allergies 04/03/2014  . Chest pain 08/02/2013  . Thyroid nodule 03/21/2013    Past Surgical History:  Procedure Laterality Date  . ESOPHAGOGASTRODUODENOSCOPY (EGD) WITH PROPOFOL N/A 09/05/2017   Procedure: ESOPHAGOGASTRODUODENOSCOPY (EGD) WITH PROPOFOL;  Surgeon: Toledo, Boykin Nearingeodoro K, MD;  Location: [facility] ENDOSCOPY;  Service: Gastroenterology;  Laterality: N/A;  . none    . TUBES IN EARS      Prior to Admission medications   Medication Sig Start Date End Date Taking? Authorizing Provider  acetaminophen (TYLENOL) 500 MG tablet Take 1,000 mg by mouth every 6 (six) hours as needed for moderate pain.    [provider]  albuterol (PROVENTIL) (2.5 MG/3ML) 0.083% nebulizer solution Inhale 2.5 mg into the lungs every 6 (six) hours as needed for wheezing or shortness of breath.  09/19/13   [provider]  ATROVENT HFA 17 MCG/ACT inhaler Inhale 2 puffs into the lungs every 6 (six) hours as needed for wheezing.  03/14/18   [provider]  BREO ELLIPTA 100-25 MCG/INH AEPB Inhale 1 puff into the lungs daily.  03/14/18   [provider]  clonazePAM (KLONOPIN) 1 MG tablet Take 1 mg by mouth 3 (three) times daily as  needed for anxiety.     [provider]  COMBIVENT RESPIMAT 20-100 MCG/ACT AERS respimat Inhale 1 puff into the lungs every 6 (six) hours as needed for wheezing.  01/20/16   [provider]  doxycycline (VIBRAMYCIN) 100 MG capsule Take 1 capsule (100 mg total) by mouth 2 (two) times daily. Patient not taking: Reported on 05/21/2018 03/20/18   Dione Booze, MD  EPIPEN 2-PAK 0.3 MG/0.3ML SOAJ injection 0.3 mg.  12/03/15   [provider]  escitalopram (LEXAPRO) 20 MG tablet Take 20 mg by mouth daily. 02/18/15   [provider]  fexofenadine (ALLEGRA) 180 MG tablet Take 180 mg by  mouth daily as needed for allergies.     [provider]  fluticasone (FLONASE) 50 MCG/ACT nasal spray Place 1 spray into both nostrils daily. 06/23/15   [provider]  ibuprofen (ADVIL,MOTRIN) 400 MG tablet Take 1 tablet (400 mg total) by mouth every 6 (six) hours as needed. Patient not taking: Reported on 05/21/2018 04/16/18   Mesner, Barbara Cower, MD  meloxicam (MOBIC) 7.5 MG tablet Take 1 tablet (7.5 mg total) by mouth daily as needed for pain. 05/21/18   Raeford Razor, MD  metoprolol succinate (TOPROL XL) 25 MG 24 hr tablet Take 1 tablet (25 mg total) by mouth daily. 05/25/18 05/25/19  Merrily Brittle, MD  neomycin-bacitracin-polymyxin (NEOSPORIN) ointment Apply 1 application topically every 12 (twelve) hours. Patient not taking: Reported on 05/21/2018 04/16/18   Mesner, Barbara Cower, MD  omeprazole (PRILOSEC) 20 MG capsule Take 20 mg by mouth 2 (two) times daily before a meal.     [provider]  oxyCODONE-acetaminophen (PERCOCET/ROXICET) 5-325 MG tablet Take 1-2 tablets by mouth every 4 (four) hours as needed for severe pain. Patient not taking: Reported on 05/21/2018 04/16/18   Mesner, Barbara Cower, MD  PROAIR HFA 108 [phone] Base) MCG/ACT inhaler INAHLE 2 PUFFS BY MOUTH EVERY 4 TO 6 HOURS AS NEEDED 09/15/16   Mungal, Vishal, MD    Allergies Diphenhydramine; Guaifenesin; Doxycycline; and Amoxicillin  Family History  Problem Relation Age of Onset  . COPD Mother   . Asthma Mother   . Mental illness Mother   . COPD Father   . Pulmonary fibrosis Father   . Lung cancer Father     Social History Social History   Tobacco Use  . Smoking status: Former Smoker    Packs/day: 0.50    Years: 2.00    Pack years: 1.00    Types: Cigarettes    Last attempt to quit: 10/31/2000    Years since quitting: 17.5  . Smokeless tobacco: Current User    Types: Chew  . Tobacco comment: 2003  Substance Use Topics  . Alcohol use: No    Alcohol/week: 0.0 oz  . Drug use: No    Review of  Systems Constitutional: No fever/chills Eyes: No visual changes. ENT: No sore throat. Cardiovascular: Positive for chest pain. Respiratory: Positive for shortness of breath. Gastrointestinal: No abdominal pain.  No nausea, no vomiting.  No diarrhea.  No constipation. Genitourinary: Negative for dysuria. Musculoskeletal: Negative for back pain. Skin: Negative for rash. Neurological: Negative for headaches, focal weakness or numbness.   ____________________________________________   PHYSICAL EXAM:  VITAL SIGNS: ED Triage Vitals  Enc Vitals Group     BP 05/25/18 1339 109/72     Pulse Rate 05/25/18 1339 (!) 58     Resp 05/25/18 1339 16     Temp 05/25/18 1339 98.8 F (37.1 C)     Temp Source  05/25/18 1339 Oral     SpO2 05/25/18 1339 96 %     Weight 05/25/18 1337 256 lb (116.1 kg)     Height 05/25/18 1337 5\' 7"  (1.702 m)     Head Circumference --      Peak Flow --      Pain Score 05/25/18 1335 4     Pain Loc --      Pain Edu? --      Excl. in GC? --     Constitutional: Alert and oriented x4 quite anxious appearing nontoxic no diaphoresis speaks in full clear sentences Eyes: PERRL EOMI. Head: Atraumatic. Nose: No congestion/rhinnorhea. Mouth/Throat: No trismus Neck: No stridor.   Cardiovascular: Tachycardic rate, regular rhythm. Grossly normal heart sounds.  Good peripheral circulation. Respiratory: Increased respiratory effort.  No retractions. Lungs CTAB and moving good air Gastrointestinal: Obese soft nontender Musculoskeletal: No lower extremity edema   Neurologic:  Normal speech and language. No gross focal neurologic deficits are appreciated. Skin:  Skin is warm, dry and intact. No rash noted. Psychiatric: Anxious appearing   ____________________________________________   DIFFERENTIAL includes but not limited to  Anxiety, atrial fibrillation, SVT, acute coronary syndrome, pulmonary embolism ____________________________________________   LABS (all labs  ordered are listed, but only abnormal results are displayed)  Labs Reviewed  BASIC METABOLIC PANEL - Abnormal; Notable for the following components:      Result Value   Potassium 3.2 (*)    Glucose, Bld 155 (*)    BUN <5 (*)    All other components within normal limits  CBC  TROPONIN I    Lab work reviewed by me with slight hypokalemia likely secondary to stress otherwise unremarkable __________________________________________  EKG  ED ECG REPORT I, Merrily Brittle, the attending physician, personally viewed and interpreted this ECG.  Date: 05/25/2018 EKG Time:  Rate: 123 Rhythm: Sinus tachycardia QRS Axis: Leftward Axis Intervals: normal ST/T Wave abnormalities: normal Narrative Interpretation: no evidence of acute ischemia  ____________________________________________  RADIOLOGY  Chest x-ray reviewed by me with no acute disease ____________________________________________   PROCEDURES  Procedure(s) performed: no  Procedures  Critical Care performed: no  ____________________________________________   INITIAL IMPRESSION / ASSESSMENT AND PLAN / ED COURSE  Pertinent labs & imaging results that were available during my care of the patient were reviewed by me and considered in my medical decision making (see chart for details).   As part of my medical decision making, I reviewed the following data within the electronic MEDICAL RECORD NUMBER History obtained from family if available, nursing notes, old chart and ekg, as well as notes from prior ED visits.  The patient arrives anxious appearing and tachycardic.  His work-up is unremarkable however his story of daily palpitations is somewhat concerning for SVT versus atrial fibrillation etc.  I will begin him on low-dose metoprolol 25 mg long-acting daily and refer him to cardiology as an outpatient.  He was kept on monitor here with no ectopy noted.  He is discharged home in improved condition verbalizes understanding and  agree with the plan.      ____________________________________________   FINAL CLINICAL IMPRESSION(S) / ED DIAGNOSES  Final diagnoses:  Palpitations  Dilated cardiomyopathy (HCC)      NEW MEDICATIONS STARTED DURING THIS VISIT:  Current Discharge Medication List    START taking these medications   Details  metoprolol succinate (TOPROL XL) 25 MG 24 hr tablet Take 1 tablet (25 mg total) by mouth daily. Qty: 30 tablet, Refills: 0  Note:  This document was prepared using Dragon voice recognition software and may include unintentional dictation errors.     Merrily Brittle, MD 05/25/18 1524

## 2018-05-27 NOTE — ED Provider Notes (Signed)
[facility] EMERGENCY DEPARTMENT Provider Note   CSN: [id number] Arrival date & time: 05/21/18  1559     History   Chief Complaint Chief Complaint  Patient presents with  . Chest Pain    HPI Steven [name] is a 37 y.o. male.  HPI   37 year old male with chest pain.  He has had intermittent stabbing pain in the center of his chest for the past 3 weeks.  Initially felt that his symptoms may be from anxiety but finally came today given how long they have persisted.  He has not noticed any appreciable exacerbating relieving factors.  Does have some mild dyspnea with it.  No fevers or chills.  No unusual leg pain or swelling.  Past Medical History:  Diagnosis Date  . Anxiety   . Arthritis    knee  . Asthma   . Chronic mental illness   . COPD (chronic obstructive pulmonary disease) (HCC)   . Depression   . GERD (gastroesophageal reflux disease)   . Heart palpitations   . Panic attack   . Pulmonary fibrosis [facility](HCC)     Patient Active Problem List   Diagnosis Date Noted  . Asthma, chronic 04/05/2015  . Chronic obstructive airway disease with asthma (HCC) 03/17/2015  . Obesity 03/17/2015  . DOE (dyspnea on exertion) 03/17/2015  . Environmental allergies 04/03/2014  . Chest pain 08/02/2013  . Thyroid nodule 03/21/2013    Past Surgical History:  Procedure Laterality Date  . ESOPHAGOGASTRODUODENOSCOPY (EGD) WITH PROPOFOL N/A 09/05/2017   Procedure: ESOPHAGOGASTRODUODENOSCOPY (EGD) WITH PROPOFOL;  Surgeon: Toledo, Boykin Nearingeodoro K, MD;  Location: [facility] ENDOSCOPY;  Service: Gastroenterology;  Laterality: N/A;  . none    . TUBES IN EARS          Home Medications    Prior to Admission medications   Medication Sig Start Date End Date Taking? Authorizing Provider  acetaminophen (TYLENOL) 500 MG tablet Take 1,000 mg by mouth every 6 (six) hours as needed for moderate pain.   Yes [provider]  albuterol (PROVENTIL) (2.5 MG/3ML) 0.083% nebulizer solution Inhale 2.5 mg  into the lungs every 6 (six) hours as needed for wheezing or shortness of breath.  09/19/13  Yes [provider]  ATROVENT HFA 17 MCG/ACT inhaler Inhale 2 puffs into the lungs every 6 (six) hours as needed for wheezing.  03/14/18  Yes [provider]  BREO ELLIPTA 100-25 MCG/INH AEPB Inhale 1 puff into the lungs daily.  03/14/18  Yes [provider]  clonazePAM (KLONOPIN) 1 MG tablet Take 1 mg by mouth 3 (three) times daily as needed for anxiety.    Yes [provider]  COMBIVENT RESPIMAT 20-100 MCG/ACT AERS respimat Inhale 1 puff into the lungs every 6 (six) hours as needed for wheezing.  01/20/16  Yes [provider]  EPIPEN 2-PAK 0.3 MG/0.3ML SOAJ injection 0.3 mg.  12/03/15  Yes [provider]  escitalopram (LEXAPRO) 20 MG tablet Take 20 mg by mouth daily. 02/18/15  Yes [provider]  fexofenadine (ALLEGRA) 180 MG tablet Take 180 mg by mouth daily as needed for allergies.    Yes [provider]  fluticasone (FLONASE) 50 MCG/ACT nasal spray Place 1 spray into both nostrils daily. 06/23/15  Yes [provider]  omeprazole (PRILOSEC) 20 MG capsule Take 20 mg by mouth 2 (two) times daily before a meal.    Yes [provider]  PROAIR HFA 108 (90 Base) MCG/ACT inhaler INAHLE 2 PUFFS BY MOUTH EVERY 4  TO 6 HOURS AS NEEDED 09/15/16  Yes Mungal, Vishal, MD  doxycycline (VIBRAMYCIN) 100 MG capsule Take 1 capsule (100 mg total) by mouth 2 (two) times daily. Patient not taking: Reported on 05/21/2018 03/20/18   Dione Booze, MD  ibuprofen (ADVIL,MOTRIN) 400 MG tablet Take 1 tablet (400 mg total) by mouth every 6 (six) hours as needed. Patient not taking: Reported on 05/21/2018 04/16/18   Mesner, Barbara Cower, MD  meloxicam (MOBIC) 7.5 MG tablet Take 1 tablet (7.5 mg total) by mouth daily as needed for pain. 05/21/18   Raeford Razor, MD  metoprolol succinate (TOPROL XL) 25 MG 24 hr tablet Take 1 tablet (25 mg total) by mouth daily.  05/25/18 05/25/19  Merrily Brittle, MD  neomycin-bacitracin-polymyxin (NEOSPORIN) ointment Apply 1 application topically every 12 (twelve) hours. Patient not taking: Reported on 05/21/2018 04/16/18   Mesner, Barbara Cower, MD  oxyCODONE-acetaminophen (PERCOCET/ROXICET) 5-325 MG tablet Take 1-2 tablets by mouth every 4 (four) hours as needed for severe pain. Patient not taking: Reported on 05/21/2018 04/16/18   Mesner, Barbara Cower, MD    Family History Family History  Problem Relation Age of Onset  . COPD Mother   . Asthma Mother   . Mental illness Mother   . COPD Father   . Pulmonary fibrosis Father   . Lung cancer Father     Social History Social History   Tobacco Use  . Smoking status: Former Smoker    Packs/day: 0.50    Years: 2.00    Pack years: 1.00    Types: Cigarettes    Last attempt to quit: 10/31/2000    Years since quitting: 17.5  . Smokeless tobacco: Current User    Types: Chew  . Tobacco comment: 2003  Substance Use Topics  . Alcohol use: No    Alcohol/week: 0.0 oz  . Drug use: No     Allergies   Diphenhydramine; Guaifenesin; Doxycycline; and Amoxicillin   Review of Systems Review of Systems  All systems reviewed and negative, other than as noted in HPI.  Physical Exam Updated Vital Signs BP 125/87 (BP Location: Left Arm)   Pulse 95   Temp 98 F (36.7 C) (Oral)   Resp 11   Ht 5\' 7"  (1.702 m)   Wt 116.6 kg (257 lb)   SpO2 96%   BMI 40.25 kg/m   Physical Exam  Constitutional: He appears well-developed and well-nourished. No distress.  HENT:  Head: Normocephalic and atraumatic.  Eyes: Conjunctivae are normal. Right eye exhibits no discharge. Left eye exhibits no discharge.  Neck: Neck supple.  Cardiovascular: Normal rate, regular rhythm and normal heart sounds. Exam reveals no gallop and no friction rub.  No murmur heard. Pulmonary/Chest: Effort normal and breath sounds normal. No respiratory distress.  Abdominal: Soft. He exhibits no distension. There is no  tenderness.  Musculoskeletal: He exhibits no edema or tenderness.  Lower extremities symmetric as compared to each other. No calf tenderness. Negative Homan's. No palpable cords.   Neurological: He is alert.  Skin: Skin is warm and dry.  Psychiatric: He has a normal mood and affect. His behavior is normal. Thought content normal.  Nursing note and vitals reviewed.    ED Treatments / Results  Labs (all labs ordered are listed, but only abnormal results are displayed) Labs Reviewed  BASIC METABOLIC PANEL - Abnormal; Notable for the following components:      Result Value   Potassium 3.4 (*)    Glucose, Bld 120 (*)    BUN <5 (*)  All other components within normal limits  CBC  TROPONIN I  D-DIMER, QUANTITATIVE (NOT AT [facility])    EKG EKG Interpretation  Date/Time:  Monday May 21 2018 16:09:42 EDT Ventricular Rate:  120 PR Interval:  132 QRS Duration: 100 QT Interval:  336 QTC Calculation: 474 R Axis:   77 Text Interpretation:  Sinus tachycardia Cannot rule out Inferior infarct , age undetermined Cannot rule out Anterior infarct , age undetermined Abnormal ECG Confirmed by Raeford Razor [id number]) on 05/21/2018 6:32:51 PM   Radiology Dg Chest 2 View  Result Date: 05/25/2018 CLINICAL DATA:  37 year old male with chest pain and shortness of breath for 1 month. EXAM: CHEST - 2 VIEW COMPARISON:  05/21/2018 and earlier. FINDINGS: Mildly increased lung volumes. Mild left lung base chronic scarring or atelectasis is less apparent. Mediastinal contours remain normal. Visualized tracheal air column is within normal limits. No pneumothorax, pulmonary edema, pleural effusion or acute pulmonary opacity. No acute osseous abnormality identified. Negative visible bowel gas pattern. IMPRESSION: No acute cardiopulmonary abnormality. Electronically Signed   By: Odessa Fleming M.D.   On: 05/25/2018 14:01    Procedures Procedures (including critical care time)  Medications Ordered in ED Medications -  No data to display   Initial Impression / Assessment and Plan / ED Course  I have reviewed the triage vital signs and the nursing notes.  Pertinent labs & imaging results that were available during my care of the patient were reviewed by me and considered in my medical decision making (see chart for details).     37 year old male with chest pain.  His description of sharp, stabbing pain lasting seconds that is atypical for ACS.  His EKG without overt ischemic changes.  Reassuring vitals.  Negative d-dimer.  I doubt ACS, PE, dissection or other emergent process.   Final Clinical Impressions(s) / ED Diagnoses   Final diagnoses:  Chest pain, unspecified type    ED Discharge Orders        Ordered    meloxicam (MOBIC) 7.5 MG tablet  Daily PRN     05/21/18 1857       Raeford Razor, MD 05/27/18 [phone]

## 2018-05-31 DIAGNOSIS — F411 Generalized anxiety disorder: Secondary | ICD-10-CM | POA: Insufficient documentation

## 2018-05-31 DIAGNOSIS — R002 Palpitations: Secondary | ICD-10-CM | POA: Insufficient documentation

## 2018-06-09 ENCOUNTER — Other Ambulatory Visit: Payer: Self-pay

## 2018-06-09 ENCOUNTER — Emergency Department (HOSPITAL_COMMUNITY): Payer: Medicare Other

## 2018-06-09 ENCOUNTER — Encounter (HOSPITAL_COMMUNITY): Payer: Self-pay | Admitting: Emergency Medicine

## 2018-06-09 ENCOUNTER — Emergency Department (HOSPITAL_COMMUNITY)
Admission: EM | Admit: 2018-06-09 | Discharge: 2018-06-09 | Disposition: A | Discharge status: Home / Self Care | Payer: Medicare Other | Attending: Emergency Medicine | Admitting: Emergency Medicine

## 2018-06-09 DIAGNOSIS — R0600 Dyspnea, unspecified: Secondary | ICD-10-CM | POA: Diagnosis not present

## 2018-06-09 DIAGNOSIS — R0602 Shortness of breath: Secondary | ICD-10-CM | POA: Diagnosis present

## 2018-06-09 DIAGNOSIS — Z79899 Other long term (current) drug therapy: Secondary | ICD-10-CM | POA: Diagnosis not present

## 2018-06-09 DIAGNOSIS — J449 Chronic obstructive pulmonary disease, unspecified: Secondary | ICD-10-CM | POA: Insufficient documentation

## 2018-06-09 DIAGNOSIS — Z87891 Personal history of nicotine dependence: Secondary | ICD-10-CM | POA: Diagnosis not present

## 2018-06-09 LAB — CBC
HCT: 46.1 % (ref 39.0–52.0)
Hemoglobin: 16.1 g/dL (ref 13.0–17.0)
MCH: 30.1 pg (ref 26.0–34.0)
MCHC: 34.9 g/dL (ref 30.0–36.0)
MCV: 86.2 fL (ref 78.0–100.0)
PLATELETS: 385 10*3/uL (ref 150–400)
RBC: 5.35 MIL/uL (ref 4.22–5.81)
RDW: 12.5 % (ref 11.5–15.5)
WBC: 8.3 10*3/uL (ref 4.0–10.5)

## 2018-06-09 LAB — BASIC METABOLIC PANEL
Anion gap: 9 (ref 5–15)
BUN: 5 mg/dL — ABNORMAL LOW (ref 6–20)
CHLORIDE: 102 mmol/L (ref 98–111)
CO2: 28 mmol/L (ref 22–32)
CREATININE: 0.9 mg/dL (ref 0.61–1.24)
Calcium: 9.4 mg/dL (ref 8.9–10.3)
GFR calc Af Amer: >60 mL/min (ref >60)
GFR calc non Af Amer: >60 mL/min (ref >60)
Glucose, Bld: 115 mg/dL — ABNORMAL HIGH (ref 70–99)
Potassium: 3.8 mmol/L (ref 3.5–5.1)
Sodium: 139 mmol/L (ref 135–145)

## 2018-06-09 LAB — POCT I-STAT TROPONIN I: Troponin i, poc: 0 ng/mL (ref 0.00–0.08)

## 2018-06-09 MED ORDER — ALBUTEROL SULFATE (2.5 MG/3ML) 0.083% IN NEBU
5.0000 mg | INHALATION_SOLUTION | Freq: Once | RESPIRATORY_TRACT | Status: AC
  Administered 2018-06-09: 5 mg via RESPIRATORY_TRACT
  Filled 2018-06-09: qty 6

## 2018-06-09 MED ORDER — IOPAMIDOL (ISOVUE-370) INJECTION 76%
100.0000 mL | Freq: Once | INTRAVENOUS | Status: AC | PRN
  Administered 2018-06-09: 75 mL via INTRAVENOUS

## 2018-06-09 NOTE — Discharge Instructions (Signed)
Your testing here shows no signs of pneumonia, no signs of blood clot and no signs of heart disease.  You should continue to take the metoprolol as prescribed and follow-up with your doctor at your appointment.  Emergency department for severe or worsening symptoms.  I would strongly encourage you to use albuterol (you can use the treatment you have at home), 1 treatment every 6 hours as needed for shortness of breath.

## 2018-06-09 NOTE — ED Provider Notes (Signed)
[facility] EMERGENCY DEPARTMENT Provider Note   CSN: [id number] Arrival date & time: 06/09/18  1249     History   Chief Complaint Chief Complaint  Patient presents with  . Shortness of Breath    HPI Steven [name] is a 37 y.o. male.  HPI  The patient is a 37 year old male, he has a known history of chronic anxiety as well as a history of COPD, pulmonary fibrosis and states that he does have panic attacks.  Over the last month or month and a half he has developed increasing amounts of shortness of breath and chest discomfort.  He reports that it will come on at all times not just when he is walking or up and about.  He is extremely concerned because of oxygen levels that dipped down to 93 or 94% even when he is sitting.  He states that he does use albuterol but states that sometimes it helps and sometimes it does not.  His work-ups have included a couple of chest x-rays at the end of July, no specific findings including no pneumothorax or pneumonias were seen.  He was given metoprolol for a persistent tachycardia which he states he is now taking 12.5 mg a day with some improvement.  No swelling of the legs, no coughing of any concern and no fevers.  His shortness of breath is persistent but nothing seems to make it better or worse.  Past Medical History:  Diagnosis Date  . Anxiety   . Arthritis    knee  . Asthma   . Chronic mental illness   . COPD (chronic obstructive pulmonary disease) (HCC)   . Depression   . GERD (gastroesophageal reflux disease)   . Heart palpitations   . Panic attack   . Pulmonary fibrosis [facility])     Patient Active Problem List   Diagnosis Date Noted  . Asthma, chronic 04/05/2015  . Chronic obstructive airway disease with asthma (HCC) 03/17/2015  . Obesity 03/17/2015  . DOE (dyspnea on exertion) 03/17/2015  . Environmental allergies 04/03/2014  . Chest pain 08/02/2013  . Thyroid nodule 03/21/2013    Past Surgical History:  Procedure Laterality  Date  . ESOPHAGOGASTRODUODENOSCOPY (EGD) WITH PROPOFOL N/A 09/05/2017   Procedure: ESOPHAGOGASTRODUODENOSCOPY (EGD) WITH PROPOFOL;  Surgeon: Toledo, Boykin Nearing, MD;  Location: [facility] ENDOSCOPY;  Service: Gastroenterology;  Laterality: N/A;  . none    . TUBES IN EARS          Home Medications    Prior to Admission medications   Medication Sig Start Date End Date Taking? Authorizing Provider  acetaminophen (TYLENOL) 500 MG tablet Take 1,000 mg by mouth every 6 (six) hours as needed for moderate pain.   Yes [provider]  albuterol (PROVENTIL) (2.5 MG/3ML) 0.083% nebulizer solution Inhale 2.5 mg into the lungs every 6 (six) hours as needed for wheezing or shortness of breath.  09/19/13  Yes [provider]  ATROVENT HFA 17 MCG/ACT inhaler Inhale 2 puffs into the lungs every 6 (six) hours as needed for wheezing.  03/14/18  Yes [provider]  BREO ELLIPTA 100-25 MCG/INH AEPB Inhale 1 puff into the lungs daily.  03/14/18  Yes [provider]  clonazePAM (KLONOPIN) 1 MG tablet Take 1 mg by mouth 3 (three) times daily as needed for anxiety.    Yes [provider]  COMBIVENT RESPIMAT 20-100 MCG/ACT AERS respimat Inhale 1 puff into the lungs every 6 (six) hours as needed for wheezing.  01/20/16  Yes [provider]  EPIPEN 2-PAK 0.3 MG/0.3ML SOAJ injection 0.3 mg.  12/03/15  Yes [provider]  escitalopram (LEXAPRO) 20 MG tablet Take 20 mg by mouth daily. 02/18/15  Yes [provider]  fexofenadine (ALLEGRA) 180 MG tablet Take 180 mg by mouth daily as needed for allergies.    Yes [provider]  fluticasone (FLONASE) 50 MCG/ACT nasal spray Place 1 spray into both nostrils daily. 06/23/15  Yes [provider]  metoprolol succinate (TOPROL XL) 25 MG 24 hr tablet Take 1 tablet (25 mg total) by mouth daily. 05/25/18 05/25/19 Yes Merrily Brittle, MD  omeprazole (PRILOSEC) 20 MG capsule Take 20 mg by mouth 2 (two) times  daily before a meal.    Yes [provider]  PROAIR HFA 108 (90 Base) MCG/ACT inhaler INAHLE 2 PUFFS BY MOUTH EVERY 4 TO 6 HOURS AS NEEDED 09/15/16  Yes Stephanie Acre, MD    Family History Family History  Problem Relation Age of Onset  . COPD Mother   . Asthma Mother   . Mental illness Mother   . COPD Father   . Pulmonary fibrosis Father   . Lung cancer Father     Social History Social History   Tobacco Use  . Smoking status: Former Smoker    Packs/day: 0.50    Years: 2.00    Pack years: 1.00    Types: Cigarettes    Last attempt to quit: 10/31/2000    Years since quitting: 17.6  . Smokeless tobacco: Current User    Types: Chew  . Tobacco comment: 2003  Substance Use Topics  . Alcohol use: No    Alcohol/week: 0.0 standard drinks  . Drug use: No     Allergies   Diphenhydramine; Guaifenesin; Doxycycline; and Amoxicillin   Review of Systems Review of Systems  All other systems reviewed and are negative.    Physical Exam Updated Vital Signs BP (!) 99/54   Pulse 81   Temp 98.2 F (36.8 C) (Oral)   Resp 14   Ht 5\' 7"  (1.702 m)   Wt 113.4 kg   SpO2 99%   BMI 39.16 kg/m   Physical Exam  Constitutional: He appears well-developed and well-nourished. No distress.  HENT:  Head: Normocephalic and atraumatic.  Mouth/Throat: Oropharynx is clear and moist. No oropharyngeal exudate.  Eyes: Pupils are equal, round, and reactive to light. Conjunctivae and EOM are normal. Right eye exhibits no discharge. Left eye exhibits no discharge. No scleral icterus.  Neck: Normal range of motion. Neck supple. No JVD present. No thyromegaly present.  Cardiovascular: Regular rhythm, normal heart sounds and intact distal pulses. Exam reveals no gallop and no friction rub.  No murmur heard. Mild tachycardia  Pulmonary/Chest: Effort normal and breath sounds normal. No respiratory distress. He has no wheezes. He has no rales.  Abdominal: Soft. Bowel sounds are normal. He  exhibits no distension and no mass. There is no tenderness.  Musculoskeletal: Normal range of motion. He exhibits no edema or tenderness.  Lymphadenopathy:    He has no cervical adenopathy.  Neurological: He is alert. Coordination normal.  Skin: Skin is warm and dry. No rash noted. No erythema.  Psychiatric: He has a normal mood and affect. His behavior is normal.  Nursing note and vitals reviewed.    ED Treatments / Results  Labs (all labs ordered are listed, but only abnormal results are displayed) Labs Reviewed  BASIC METABOLIC PANEL - Abnormal; Notable for the following components:  Result Value   Glucose, Bld 115 (*)    BUN 5 (*)    All other components within normal limits  CBC  I-STAT TROPONIN, ED  POCT I-STAT TROPONIN I    EKG EKG Interpretation  Date/Time:  Saturday June 09 2018 13:03:30 EDT Ventricular Rate:  105 PR Interval:  124 QRS Duration: 106 QT Interval:  350 QTC Calculation: 462 R Axis:   -2 Text Interpretation:  Sinus tachycardia with occasional Premature ventricular complexes Nonspecific ST and T wave abnormality Abnormal ECG Since last tracing rate slower Confirmed by Eber HongMiller, Demetreus Lothamer ([id number]) on 06/09/2018 1:37:03 PM   Radiology Ct Angio Chest Pe W And/or Wo Contrast  Result Date: 06/09/2018 CLINICAL DATA:  Patient c/o shortness of breath x1 month that is progressively getting worse. Patient states some chest tightness. Patient states seen here in ED and at [facility] ED for tachycardia and palpitations. Patient states placed on metoprolol 25mg  x2 weeks ago Patient reports improvement in heart rate and decrease in palpitations until medication was decreased to 12.5mg . EXAM: CT ANGIOGRAPHY CHEST WITH CONTRAST TECHNIQUE: Multidetector CT imaging of the chest was performed using the standard protocol during bolus administration of intravenous contrast. Multiplanar CT image reconstructions and MIPs were obtained to evaluate the vascular anatomy. CONTRAST:   75mL ISOVUE-370 IOPAMIDOL (ISOVUE-370) INJECTION 76% COMPARISON:  Chest radiograph, 05/25/2018. FINDINGS: Cardiovascular: Contrast opacification is somewhat suboptimal, with relatively equal opacification of the aorta and pulmonary arteries. This along with relatively low lung volumes, as well as respiratory motion, limits assessment for segmental and subsegmental pulmonary emboli. Allowing for the limitations, there is no evidence of a central pulmonary embolus. Heart is normal in size and configuration. No pericardial effusion. No coronary artery calcifications. Great vessels are normal caliber. No aortic dissection or atherosclerosis. Mediastinum/Nodes: Borderline enlarged right neck base lymph node measuring 11 mm in short axis. Thyroid is unremarkable. No other neck base adenopathy. No mediastinal or hilar masses. No enlarged lymph nodes. Trachea and esophagus are unremarkable. Lungs/Pleura: Mild subsegmental atelectasis in the lower lobes. Lungs otherwise clear with no evidence of pneumonia or pulmonary edema. No lung mass or nodule. No pleural effusion or pneumothorax. Upper Abdomen: Unremarkable. Musculoskeletal: No chest wall abnormality. No acute or significant osseous findings. Review of the MIP images confirms the above findings. IMPRESSION: 1. Exam limited for the assessment of segmental and subsegmental PE. Allowing for this, there is no evidence of a central pulmonary embolus. 2. No acute findings. 3. Mild subsegmental lower lobe atelectasis. No other abnormalities. Electronically Signed   By: Amie Portlandavid  Ormond M.D.   On: 06/09/2018 15:13    Procedures Procedures (including critical care time)  Medications Ordered in ED Medications  albuterol (PROVENTIL) (2.5 MG/3ML) 0.083% nebulizer solution 5 mg (5 mg Nebulization Given 06/09/18 1420)  iopamidol (ISOVUE-370) 76 % injection 100 mL (75 mLs Intravenous Contrast Given 06/09/18 1444)     Initial Impression / Assessment and Plan / ED Course  I  have reviewed the triage vital signs and the nursing notes.  Pertinent labs & imaging results that were available during my care of the patient were reviewed by me and considered in my medical decision making (see chart for details).  Clinical Course as of Jun 09 1524  Sat Jun 09, 2018  1523 Labs are unremarkable with no leukocytosis, no anemia, normal metabolic panel and a normal troponin.  His CT scan shows no evidence of significant lung disease, cardiac disease or obvious central pulmonary embolism.  The patient's heart rate is down  to 80, he appears comfortable, I suspect a big part of his shortness of breath is anxiety.   [BM]    Clinical Course User Index [BM] Eber Hong, MD    The patient appears mildly anxious, he is mildly tachycardic, his lung sounds are otherwise normal.  He is extremely concerned about this oxygenation of 93 to 94% on his home pulse ox machine however at this time this is really non-concerning.  I have explained to him that this is a relatively normal range and given his underlying history of pulmonary fibrosis and COPD it is not unusual to have numbers that low.  I will give him an albuterol treatment and check a CT angiogram however he has had a very complete work-up prior to this.  His EKG today shows no acute findings, no ventricular hypertrophy, no ischemia.  The patient does have albuterol at home which she will continue to use.  Reassurance given, he will see pulmonary this week at the appointment which was made for him by his primary doctor.  Final Clinical Impressions(s) / ED Diagnoses   Final diagnoses:  Dyspnea, unspecified type      Eber Hong, MD 06/09/18 1525

## 2018-06-09 NOTE — ED Triage Notes (Signed)
Patient c/o shortness of breath x1 month that is progressively getting worse. Patient states some chest tightness. Patient states seen here in ED and at [facility] ED for tachycardia and palpitations. Patient states placed on metoprolol 25mg   x2 weeks ago Patient reports improvement in heart rate and decrease in palpitations until medication was decreased to 12.5mg .

## 2018-06-12 ENCOUNTER — Ambulatory Visit: Payer: Self-pay | Admitting: Internal Medicine

## 2018-06-29 ENCOUNTER — Ambulatory Visit: Payer: Self-pay | Admitting: Adult Health

## 2018-06-29 ENCOUNTER — Telehealth: Payer: Self-pay

## 2018-06-29 NOTE — Telephone Encounter (Signed)
Pt called complaining of shortness of breath and anxiety, informed the pt that he needs to be seen if he is having problems with his breathing or he needs to go to the ER. Pt stated that he lives away from the office and he gets anxiety when he drives over . Transferred pt to the front desk to speak with Beth.

## 2018-07-10 ENCOUNTER — Encounter: Payer: Self-pay | Admitting: Adult Health

## 2018-07-10 ENCOUNTER — Ambulatory Visit (INDEPENDENT_AMBULATORY_CARE_PROVIDER_SITE_OTHER): Payer: Medicare Other | Admitting: Adult Health

## 2018-07-10 VITALS — BP 130/82 | HR 64 | Temp 98.0°F | Resp 16 | Ht 67.0 in | Wt 235.8 lb

## 2018-07-10 DIAGNOSIS — R0602 Shortness of breath: Secondary | ICD-10-CM | POA: Diagnosis not present

## 2018-07-10 DIAGNOSIS — F411 Generalized anxiety disorder: Secondary | ICD-10-CM | POA: Diagnosis not present

## 2018-07-10 MED ORDER — BREO ELLIPTA 100-25 MCG/INH IN AEPB
1.0000 | INHALATION_SPRAY | Freq: Every day | RESPIRATORY_TRACT | 3 refills | Status: AC
Start: 2018-07-10 — End: ?

## 2018-07-10 MED ORDER — ALBUTEROL SULFATE (2.5 MG/3ML) 0.083% IN NEBU: 2.5000 mg | INHALATION_SOLUTION | Freq: Four times a day (QID) | RESPIRATORY_TRACT | 4 refills | Status: DC | PRN

## 2018-07-10 MED ORDER — COMBIVENT RESPIMAT 20-100 MCG/ACT IN AERS: 1.0000 | INHALATION_SPRAY | Freq: Four times a day (QID) | RESPIRATORY_TRACT | 2 refills | Status: DC | PRN

## 2018-07-10 MED ORDER — ATROVENT HFA 17 MCG/ACT IN AERS: 2.0000 | INHALATION_SPRAY | Freq: Four times a day (QID) | RESPIRATORY_TRACT | 0 refills | Status: DC | PRN

## 2018-07-10 NOTE — Patient Instructions (Signed)

## 2018-07-10 NOTE — Progress Notes (Signed)
[facility] [place], [place] [address]  Pulmonary Sleep Medicine   Office Visit Note  Patient Name: Steven [name] DOB: 15-Aug-1981 MRN [id number]  Date of Service: 07/10/2018  Complaints/HPI: Pt here for SOB and anxiety.  He reports for the last 3 months he has been laying around, unable to do anything.  Pt is getting anxious in room, asking for door to be open.  He reports he uses oxygen at night and sometimes during the day. He is very anxious about his heart rate, and about his oxygen.  ROS General: (-) fever, (-) chills, (-) night sweats, (-) weakness Skin: (-) rashes, (-) itching,. Eyes: (-) visual changes, (-) redness, (-) itching. Nose and Sinuses: (-) nasal stuffiness or itchiness, (-) postnasal drip, (-) nosebleeds, (-) sinus trouble. Mouth and Throat: (-) sore throat, (-) hoarseness. Neck: (-) swollen glands, (-) enlarged thyroid, (-) neck pain. Respiratory: - cough, (-) bloody sputum, + shortness of breath, - wheezing. Cardiovascular: - ankle swelling, (-) chest pain. Lymphatic: (-) lymph node enlargement. Neurologic: (-) numbness, (-) tingling. Psychiatric: (-) anxiety, (-) depression   Current Medication: Outpatient Encounter Medications as of 07/10/2018  Medication Sig  . acetaminophen (TYLENOL) 500 MG tablet Take 1,000 mg by mouth every 6 (six) hours as needed for moderate pain.  Marland Kitchen albuterol (PROVENTIL) (2.5 MG/3ML) 0.083% nebulizer solution Inhale 2.5 mg into the lungs every 6 (six) hours as needed for wheezing or shortness of breath.   . ATROVENT HFA 17 MCG/ACT inhaler Inhale 2 puffs into the lungs every 6 (six) hours as needed for wheezing.   Marland Kitchen BREO ELLIPTA 100-25 MCG/INH AEPB Inhale 1 puff into the lungs daily.   . clonazePAM (KLONOPIN) 1 MG tablet Take 1 mg by mouth 3 (three) times daily as needed for anxiety.   . COMBIVENT RESPIMAT 20-100 MCG/ACT AERS respimat Inhale 1 puff into the lungs every 6 (six) hours as needed for wheezing.    Marland Kitchen EPIPEN 2-PAK 0.3 MG/0.3ML SOAJ injection 0.3 mg.   . escitalopram (LEXAPRO) 20 MG tablet Take 20 mg by mouth daily.  . fexofenadine (ALLEGRA) 180 MG tablet Take 180 mg by mouth daily as needed for allergies.   . fluticasone (FLONASE) 50 MCG/ACT nasal spray Place 1 spray into both nostrils daily.  . metoprolol succinate (TOPROL XL) 25 MG 24 hr tablet Take 1 tablet (25 mg total) by mouth daily.  Marland Kitchen omeprazole (PRILOSEC) 20 MG capsule Take 20 mg by mouth 2 (two) times daily before a meal.   . OXYGEN Inhale into the lungs. 2LITERS AT NIGHT AND WHEN SICK  . PROAIR HFA 108 (90 Base) MCG/ACT inhaler INAHLE 2 PUFFS BY MOUTH EVERY 4 TO 6 HOURS AS NEEDED   No facility-administered encounter medications on file as of 07/10/2018.     Surgical History: Past Surgical History:  Procedure Laterality Date  . ESOPHAGOGASTRODUODENOSCOPY (EGD) WITH PROPOFOL N/A 09/05/2017   Procedure: ESOPHAGOGASTRODUODENOSCOPY (EGD) WITH PROPOFOL;  Surgeon: Toledo, Benay Pike, MD;  Location: [facility] ENDOSCOPY;  Service: Gastroenterology;  Laterality: N/A;  . none    . TUBES IN EARS      Medical History: Past Medical History:  Diagnosis Date  . Anxiety   . Arthritis    knee  . Asthma   . Chronic mental illness   . COPD (chronic obstructive pulmonary disease) ([place])   . Depression   . GERD (gastroesophageal reflux disease)   . Heart palpitations   . Panic attack   . Pulmonary fibrosis ([place])  Family History: Family History  Problem Relation Age of Onset  . COPD Mother   . Asthma Mother   . Mental illness Mother   . COPD Father   . Pulmonary fibrosis Father   . Lung cancer Father     Social History: Social History   Socioeconomic History  . Marital status: Married    Spouse name: Not on file  . Number of children: Not on file  . Years of education: Not on file  . Highest education level: Not on file  Occupational History  . Not on file  Social Needs  . Financial resource strain: Not on file  .  Food insecurity:    Worry: Not on file    Inability: Not on file  . Transportation needs:    Medical: Not on file    Non-medical: Not on file  Tobacco Use  . Smoking status: Former Smoker    Packs/day: 0.50    Years: 2.00    Pack years: 1.00    Types: Cigarettes    Last attempt to quit: 10/31/2000    Years since quitting: 17.7  . Smokeless tobacco: Current User    Types: Chew  . Tobacco comment: 2003  Substance and Sexual Activity  . Alcohol use: No    Alcohol/week: 0.0 standard drinks  . Drug use: No  . Sexual activity: Not on file  Lifestyle  . Physical activity:    Days per week: Not on file    Minutes per session: Not on file  . Stress: Not on file  Relationships  . Social connections:    Talks on phone: Not on file    Gets together: Not on file    Attends religious service: Not on file    Active member of club or organization: Not on file    Attends meetings of clubs or organizations: Not on file    Relationship status: Not on file  . Intimate partner violence:    Fear of current or ex partner: Not on file    Emotionally abused: Not on file    Physically abused: Not on file    Forced sexual activity: Not on file  Other Topics Concern  . Not on file  Social History Narrative  . Not on file    Vital Signs: Blood pressure 130/82, pulse 64, temperature 98 F (36.7 C), resp. rate 16, height _0  (1.702 m), weight 235 lb 12.8 oz (107 kg), SpO2 95 %.  Examination: General Appearance: The patient is well-developed, well-nourished, and in no distress. Skin: Gross inspection of skin unremarkable. Head: normocephalic, no gross deformities. Eyes: no gross deformities noted. ENT: ears appear grossly normal no exudates. Neck: Supple. No thyromegaly. No LAD. Respiratory:  Clear to auscultation bilateral, however reduced air entry Cardiovascular: Normal S1 and S2 without murmur or rub. Extremities: No cyanosis. pulses are equal. Neurologic: Alert and oriented. No  involuntary movements.  LABS: Recent Results (from the past 2160 hour(s))  Basic metabolic panel     Status: Abnormal   Collection Time: 05/21/18  4:51 PM  Result Value Ref Range   Sodium 139 135 - 145 mmol/L   Potassium 3.4 (L) 3.5 - 5.1 mmol/L   Chloride 105 98 - 111 mmol/L   CO2 25 22 - 32 mmol/L   Glucose, Bld 120 (H) 70 - 99 mg/dL   BUN <5 (L) 6 - 20 mg/dL   Creatinine, Ser 0.88 0.61 - 1.24 mg/dL   Calcium 9.2 8.9 - 10.3 mg/dL  GFR calc non Af Amer >60 >60 mL/min   GFR calc Af Amer >60 >60 mL/min    Comment: (NOTE) The eGFR has been calculated using the CKD EPI equation. This calculation has not been validated in all clinical situations. eGFR's persistently <60 mL/min signify possible Chronic Kidney Disease.    Anion gap 9 5 - 15    Comment: Performed at [facility], [address], [place], [place] [address]  CBC     Status: None   Collection Time: 05/21/18  4:51 PM  Result Value Ref Range   WBC 10.0 4.0 - 10.5 K/uL   RBC 5.40 4.22 - 5.81 MIL/uL   Hemoglobin 16.2 13.0 - 17.0 g/dL   HCT 46.3 39.0 - 52.0 %   MCV 85.7 78.0 - 100.0 fL   MCH 30.0 26.0 - 34.0 pg   MCHC 35.0 30.0 - 36.0 g/dL   RDW 12.2 11.5 - 15.5 %   Platelets 336 150 - 400 K/uL    Comment: Performed at [facility], [address] [place], [place] [address]  Troponin I     Status: None   Collection Time: 05/21/18  4:51 PM  Result Value Ref Range   Troponin I <0.03 <0.03 ng/mL    Comment: Performed at [facility], [address] [place], [place] [address]  D-dimer, quantitative (not at [facility])     Status: None   Collection Time: 05/21/18  4:51 PM  Result Value Ref Range   D-Dimer, Quant <0.27 0.00 - 0.50 ug/mL-FEU    Comment: (NOTE) At the manufacturer cut-off of 0.50 ug/mL FEU, this assay has been documented to exclude PE with a sensitivity and negative predictive value of 97 to 99%.  At this time, this assay has not been approved by the FDA to exclude DVT/VTE. Results should be correlated with  clinical presentation. Performed at [facility], [address], [place], [place] [address]   Basic metabolic panel     Status: Abnormal   Collection Time: 05/25/18  1:40 PM  Result Value Ref Range   Sodium 137 135 - 145 mmol/L   Potassium 3.2 (L) 3.5 - 5.1 mmol/L   Chloride 102 98 - 111 mmol/L   CO2 25 22 - 32 mmol/L   Glucose, Bld 155 (H) 70 - 99 mg/dL   BUN <5 (L) 6 - 20 mg/dL   Creatinine, Ser 0.92 0.61 - 1.24 mg/dL   Calcium 9.0 8.9 - 10.3 mg/dL   GFR calc non Af Amer >60 >60 mL/min   GFR calc Af Amer >60 >60 mL/min    Comment: (NOTE) The eGFR has been calculated using the CKD EPI equation. This calculation has not been validated in all clinical situations. eGFR's persistently <60 mL/min signify possible Chronic Kidney Disease.    Anion gap 10 5 - 15    Comment: Performed at [facility], [address] [place], [place] [address]  CBC     Status: None   Collection Time: 05/25/18  1:40 PM  Result Value Ref Range   WBC 10.5 3.8 - 10.6 K/uL   RBC 5.38 4.40 - 5.90 MIL/uL   Hemoglobin 16.2 13.0 - 18.0 g/dL   HCT 45.6 40.0 - 52.0 %   MCV 84.8 80.0 - 100.0 fL   MCH 30.1 26.0 - 34.0 pg   MCHC 35.4 32.0 - 36.0 g/dL   RDW 12.9 11.5 - 14.5 %   Platelets 327 150 - 440 K/uL    Comment: Performed at [facility], [place], [place] [address]  Troponin I     Status: None   Collection Time: 05/25/18  1:40 PM  Result Value Ref Range   Troponin I <0.03 <0.03 ng/mL    Comment: Performed at [facility], [address] [place], [place] [address]  Basic metabolic panel     Status: Abnormal   Collection Time: 06/09/18  1:27 PM  Result Value Ref Range   Sodium 139 135 - 145 mmol/L   Potassium 3.8 3.5 - 5.1 mmol/L   Chloride 102 98 - 111 mmol/L   CO2 28 22 - 32 mmol/L   Glucose, Bld 115 (H) 70 - 99 mg/dL   BUN 5 (L) 6 - 20 mg/dL   Creatinine, Ser 0.90 0.61 - 1.24 mg/dL   Calcium 9.4 8.9 - 10.3 mg/dL   GFR calc non Af Amer >60 >60 mL/min    GFR calc Af Amer >60 >60 mL/min    Comment: (NOTE) The eGFR has been calculated using the CKD EPI equation. This calculation has not been validated in all clinical situations. eGFR's persistently <60 mL/min signify possible Chronic Kidney Disease.    Anion gap 9 5 - 15    Comment: Performed at [facility], [address], [place], [place] [address]  CBC     Status: None   Collection Time: 06/09/18  1:27 PM  Result Value Ref Range   WBC 8.3 4.0 - 10.5 K/uL   RBC 5.35 4.22 - 5.81 MIL/uL   Hemoglobin 16.1 13.0 - 17.0 g/dL   HCT 46.1 39.0 - 52.0 %   MCV 86.2 78.0 - 100.0 fL   MCH 30.1 26.0 - 34.0 pg   MCHC 34.9 30.0 - 36.0 g/dL   RDW 12.5 11.5 - 15.5 %   Platelets 385 150 - 400 K/uL    Comment: Performed at [facility], [address] [place], [place] [address]  POCT i-Stat troponin I     Status: None   Collection Time: 06/09/18  2:04 PM  Result Value Ref Range   Troponin i, poc 0.00 0.00 - 0.08 ng/mL   Comment 3            Comment: Due to the release kinetics of cTnI, a negative result within the first hours of the onset of symptoms does not rule out myocardial infarction with certainty. If myocardial infarction is still suspected, repeat the test at appropriate intervals.     Radiology: Ct Angio Chest Pe W And/or Wo Contrast  Result Date: 06/09/2018 CLINICAL DATA:  Patient c/o shortness of breath x1 month that is progressively getting worse. Patient states some chest tightness. Patient states seen here in ED and at [facility] ED for tachycardia and palpitations. Patient states placed on metoprolol 78m x2 weeks ago Patient reports improvement in heart rate and decrease in palpitations until medication was decreased to 12.580m EXAM: CT ANGIOGRAPHY CHEST WITH CONTRAST TECHNIQUE: Multidetector CT imaging of the chest was performed using the standard protocol during bolus administration of intravenous contrast. Multiplanar CT image reconstructions and MIPs were obtained to evaluate  the vascular anatomy. CONTRAST:  [id number] IOPAMIDOL (ISOVUE-370) INJECTION 76% COMPARISON:  Chest radiograph, 05/25/2018. FINDINGS: Cardiovascular: Contrast opacification is somewhat suboptimal, with relatively equal opacification of the aorta and pulmonary arteries. This along with relatively low lung volumes, as well as respiratory motion, limits assessment for segmental and subsegmental pulmonary emboli. Allowing for the limitations, there is no evidence of a central pulmonary embolus. Heart is normal  in size and configuration. No pericardial effusion. No coronary artery calcifications. Great vessels are normal caliber. No aortic dissection or atherosclerosis. Mediastinum/Nodes: Borderline enlarged right neck base lymph node measuring 11 mm in short axis. Thyroid is unremarkable. No other neck base adenopathy. No mediastinal or hilar masses. No enlarged lymph nodes. Trachea and esophagus are unremarkable. Lungs/Pleura: Mild subsegmental atelectasis in the lower lobes. Lungs otherwise clear with no evidence of pneumonia or pulmonary edema. No lung mass or nodule. No pleural effusion or pneumothorax. Upper Abdomen: Unremarkable. Musculoskeletal: No chest wall abnormality. No acute or significant osseous findings. Review of the MIP images confirms the above findings. IMPRESSION: 1. Exam limited for the assessment of segmental and subsegmental PE. Allowing for this, there is no evidence of a central pulmonary embolus. 2. No acute findings. 3. Mild subsegmental lower lobe atelectasis. No other abnormalities. Electronically Signed   By: Lajean Manes M.D.   On: 06/09/2018 15:13   Assessment and Plan: Patient Active Problem List   Diagnosis Date Noted  . Asthma, chronic 04/05/2015  . Chronic obstructive airway disease with asthma ([place]) 03/17/2015  . Obesity 03/17/2015  . DOE (dyspnea on exertion) 03/17/2015  . Environmental allergies 04/03/2014  . Chest pain 08/02/2013  . Thyroid nodule 03/21/2013    1. GAD (generalized anxiety disorder) Pt very anxious during visit.    2. SOB (shortness of breath) Pt's intermittent Sob likely has anxiety component.  Encouraged patient to stop being sedentary, and to walk inside the house to get his strength up.    3. Morbid obesity ([place]) Obesity Counseling: Risk Assessment: An assessment of behavioral risk factors was made today and includes lack of exercise sedentary lifestyle, lack of portion control and poor dietary habits.  Risk Modification Advice: She was counseled on portion control guidelines. Restricting daily caloric intake to. . The detrimental long term effects of obesity on her health and ongoing poor compliance was also discussed with the patient.  General Counseling: I have discussed the findings of the evaluation and examination with Steven [name].  I have also discussed any further diagnostic evaluation thatmay be needed or ordered today. Steven [name] verbalizes understanding of the findings of todays visit. We also reviewed his medications today and discussed drug interactions and side effects including but not limited excessive drowsiness and altered mental states. We also discussed that there is always a risk not just to him but also people around him. he has been encouraged to call the office with any questions or concerns that should arise related to todays visit.  Time spent: 25 This patient was seen by Orson Gear AGNP-C in Collaboration with Dr. Devona Konig as a part of collaborative care agreement.   I have personally obtained a history, examined the patient, evaluated laboratory and imaging results, formulated the assessment and plan and placed orders.    Allyne Gee, MD [facility] Pulmonary and Critical Care Sleep medicine

## 2018-08-09 ENCOUNTER — Ambulatory Visit: Payer: Self-pay | Admitting: Psychiatry

## 2018-08-27 ENCOUNTER — Ambulatory Visit: Payer: Medicare Other | Admitting: Psychiatry

## 2018-09-13 ENCOUNTER — Encounter

## 2018-09-13 ENCOUNTER — Other Ambulatory Visit: Payer: Self-pay

## 2018-09-13 ENCOUNTER — Ambulatory Visit (INDEPENDENT_AMBULATORY_CARE_PROVIDER_SITE_OTHER): Payer: Medicare Other | Admitting: Psychiatry

## 2018-09-13 ENCOUNTER — Encounter: Payer: Self-pay | Admitting: Psychiatry

## 2018-09-13 VITALS — BP 145/99 | HR 85 | Temp 97.6°F | Wt 244.4 lb

## 2018-09-13 DIAGNOSIS — F411 Generalized anxiety disorder: Secondary | ICD-10-CM | POA: Diagnosis not present

## 2018-09-13 DIAGNOSIS — F330 Major depressive disorder, recurrent, mild: Secondary | ICD-10-CM | POA: Diagnosis not present

## 2018-09-13 DIAGNOSIS — F410 Panic disorder [episodic paroxysmal anxiety] without agoraphobia: Secondary | ICD-10-CM | POA: Diagnosis not present

## 2018-09-13 NOTE — Progress Notes (Signed)
Psychiatric Initial Adult Assessment   Patient Identification: Steven [name] MRN:  [id number] Date of Evaluation:  09/13/2018 Referral Source: Dr.[name] Durene [name] Chief Complaint:  ' I am here to establish care." Chief Complaint    Establish Care; Anxiety; Panic Attack; Depression     Visit Diagnosis:    ICD-10-CM   1. GAD (generalized anxiety disorder) F41.1   2. Panic disorder F41.0   3. MDD (major depressive disorder), recurrent episode, mild (HCC) F33.0     History of Present Illness: Steven [name] is a 37 year old Caucasian male, on SSD, lives in [place], [place] history of anxiety, depression , panic disorder, tachycardia, asthma, COPD, urinary fibrosis, history of thyroid nodule, GERD,history of cardiomyopathy, presented to the clinic today to establish care.  Pt reports he has a history of depression and anxiety since the past several years.  He reports he is currently under the care of his primary medical doctor at [facility].  Pt reports he is currently on medications like Lexapro, Effexor as well as Klonopin.  He reports he has been on Lexapro as well as Klonopin since the past several years.  He reports the Effexor was started a month ago since he was having some worsening anxiety symptoms.  Patient reports a history of sadness, lack of motivation, anhedonia, crying spells, sleep problems and so on.  He however reports his symptoms as improved on the current medication regimen.  He reports he has started setting goals for himself and that has helped him a lot.   Patient reports a history of feeling nervous, restless, fidgety, worrying about different things and so on.  He reports he also has a history of panic attacks when he has shortness of breath, feels dizzy and lightheaded and feels the world closing in on him.  He reports it happens every day however he does not have full-blown panic attacks at this time.  His panic symptoms are milder.  Patient reports he takes  Klonopin every day which helps.  He reports he is being weaned off of the Klonopin by his primary medical doctor at this time.  Pt denies any suicidality.  Patient denies any perceptual disturbances.  Patient denies any homicidality.  Patient denies any history of trauma.  He reports he was adopted as a child and was raised by his father's ex-wife.  He reports his mother struggled with depression and could not take care of him.  Patient reports his father had medical problems and was unable to care for him.  Patient reports that his father passed away when he was 23 years old.  He reports he had a good childhood. His biological mother has some health issues and patient currently lives with her on and off to help her.  He reports he is married and he and his wife  has 4 children aged between  60 years and 9 months.  He has multiple medical problems including pulmonary fibrosis, GERD, COPD, tachycardia and so on.  He hence is on disability.   Patient reports he wants to stay on the Lexapro and does not want his Effexor increased.  He would rather stay on Lexapro and discontinue the Effexor since the last time he tried to come off of the Lexapro he was extremely depressed.  Patient reports he likes the Lexapro and Klonopin at the current dosage and does not want any changes.  He reports he will work with his primary medical doctor in weaning of the Klonopin and does not want Clinical research associate  to do that.  Patient reports he would like to pursue psychotherapy for his anxiety symptoms and does not want more medication changes today.        Associated Signs/Symptoms: Depression Symptoms:  depressed mood, psychomotor retardation, fatigue, difficulty concentrating, anxiety, (Hypo) Manic Symptoms:  denies Anxiety Symptoms:  Excessive Worry, Panic Symptoms, Social Anxiety, Psychotic Symptoms:  denies PTSD Symptoms: Negative  Past Psychiatric History: Reports he was [facility] for anger  outburst.  Patient reports a history of being diagnosed with depression, anxiety in the past.  He reports he was under the care of [facility], Steven [name] in the past.  He reports that his medications are currently being managed by his primary medical doctor at [facility].  Patient denies any history of suicide attempts.  Previous Psychotropic Medications: Yes Reports he has been tried on several medications in the past however he does not remember all the names.  He reports a previous trial of BuSpar, Wellbutrin, Abilify, Klonopin, Lexapro, Effexor.  Substance Abuse History in the last 12 months:  No.  Consequences of Substance Abuse: Negative  Past Medical History:  Past Medical History:  Diagnosis Date  . Anxiety   . Arthritis    knee  . Asthma   . Chronic mental illness   . COPD (chronic obstructive pulmonary disease) (HCC)   . Depression   . GERD (gastroesophageal reflux disease)   . Heart palpitations   . Panic attack   . Pulmonary fibrosis (HCC)     Past Surgical History:  Procedure Laterality Date  . ESOPHAGOGASTRODUODENOSCOPY (EGD) WITH PROPOFOL N/A 09/05/2017   Procedure: ESOPHAGOGASTRODUODENOSCOPY (EGD) WITH PROPOFOL;  Surgeon: Toledo, Boykin Nearingeodoro K, MD;  Location: [facility] ENDOSCOPY;  Service: Gastroenterology;  Laterality: N/A;  . none    . TUBES IN EARS      Family Psychiatric History: Mother-depression and anxiety.  Family History:  Family History  Problem Relation Age of Onset  . COPD Mother   . Asthma Mother   . Mental illness Mother   . COPD Father   . Pulmonary fibrosis Father   . Lung cancer Father     Social History:   Social History   Socioeconomic History  . Marital status: Married    Spouse name: Steven [name]  . Number of children: 4  . Years of education: Not on file  . Highest education level: Associate degree: occupational, Scientist, product/process developmenttechnical, or vocational program  Occupational History  . Not on file  Social Needs  .  Financial resource strain: Not hard at all  . Food insecurity:    Worry: Never true    Inability: Never true  . Transportation needs:    Medical: No    Non-medical: No  Tobacco Use  . Smoking status: Former Smoker    Packs/day: 0.50    Years: 2.00    Pack years: 1.00    Types: Cigarettes    Last attempt to quit: 10/31/2000    Years since quitting: 17.8  . Smokeless tobacco: Current User    Types: Chew  . Tobacco comment: 2003  Substance and Sexual Activity  . Alcohol use: No    Alcohol/week: 0.0 standard drinks  . Drug use: No  . Sexual activity: Yes  Lifestyle  . Physical activity:    Days per week: 0 days    Minutes per session: 0 min  . Stress: Rather much  Relationships  . Social connections:    Talks on phone: Not on file  Gets together: Not on file    Attends religious service: Never    Active member of club or organization: No    Attends meetings of clubs or organizations: Never    Relationship status: Married  Other Topics Concern  . Not on file  Social History Narrative  . Not on file    Additional Social History: Patient lives in [place]. Patient is married.  He has 4 children aged 17 years, 12 years, 2 years and 9 months.  He is on disability.  He reports he went up to college.  His wife used to work as a Lawyer however has not returned to work after giving birth to the youngest child.  Patient has a mother with whom he has a good relationship with.  Allergies:   Allergies  Allergen Reactions  . Diphenhydramine Anaphylaxis  . Guaifenesin Swelling    Bodily Swelling  . Doxycycline     tachycardia  . Amoxicillin Nausea Only    Other reaction(s): Other (See Comments) Increases anxiety Other reaction(s): Other (See Comments) Increases anxiety    Metabolic Disorder Labs: No results found for: HGBA1C, MPG No results found for: PROLACTIN No results found for: CHOL, TRIG, HDL, CHOLHDL, VLDL, LDLCALC No results found for: TSH  Therapeutic Level Labs: No  results found for: LITHIUM No results found for: CBMZ No results found for: VALPROATE  Current Medications: Current Outpatient Medications  Medication Sig Dispense Refill  . acetaminophen (TYLENOL) 500 MG tablet Take 1,000 mg by mouth every 6 (six) hours as needed for moderate pain.    Marland Kitchen albuterol (PROVENTIL) (2.5 MG/3ML) 0.083% nebulizer solution Inhale 3 mLs (2.5 mg total) into the lungs every 6 (six) hours as needed for wheezing or shortness of breath. 75 mL 4  . ATROVENT HFA 17 MCG/ACT inhaler Inhale 2 puffs into the lungs every 6 (six) hours as needed for wheezing. 1 Inhaler 0  . BREO ELLIPTA 100-25 MCG/INH AEPB Inhale 1 puff into the lungs daily. 1 each 3  . clonazePAM (KLONOPIN) 1 MG tablet Take 1 mg by mouth 3 (three) times daily as needed for anxiety.     . COMBIVENT RESPIMAT 20-100 MCG/ACT AERS respimat Inhale 1 puff into the lungs every 6 (six) hours as needed for wheezing. 1 Inhaler 2  . EPIPEN 2-PAK 0.3 MG/0.3ML SOAJ injection 0.3 mg.     . escitalopram (LEXAPRO) 20 MG tablet Take 20 mg by mouth daily.    . fexofenadine (ALLEGRA) 180 MG tablet Take 180 mg by mouth daily as needed for allergies.     . fluticasone (FLONASE) 50 MCG/ACT nasal spray Place 1 spray into both nostrils daily.    . metoprolol succinate (TOPROL XL) 25 MG 24 hr tablet Take 1 tablet (25 mg total) by mouth daily. 30 tablet 0  . omeprazole (PRILOSEC) 20 MG capsule Take 20 mg by mouth 2 (two) times daily before a meal.     . OXYGEN Inhale into the lungs. 2LITERS AT NIGHT AND WHEN SICK    . PROAIR HFA 108 (90 Base) MCG/ACT inhaler INAHLE 2 PUFFS BY MOUTH EVERY 4 TO 6 HOURS AS NEEDED 8.5 g 0   No current facility-administered medications for this visit.     Musculoskeletal: Strength & Muscle Tone: within normal limits Gait & Station: normal Patient leans: N/A  Psychiatric Specialty Exam: Review of Systems  Psychiatric/Behavioral: Positive for depression (improving). The patient is nervous/anxious  (improving).   All other systems reviewed and are negative.   Blood pressure Marland Kitchen)  145/99, pulse 85, temperature 97.6 F (36.4 C), temperature source Oral, weight 244 lb 6.4 oz (110.9 kg).Body mass index is 38.28 kg/m.  General Appearance: Casual  Eye Contact:  Fair  Speech:  Clear and Coherent  Volume:  Normal  Mood:  Anxious and Dysphoric  Affect:  Appropriate  Thought Process:  Goal Directed and Descriptions of Associations: Intact  Orientation:  Full (Time, Place, and Person)  Thought Content:  Logical  Suicidal Thoughts:  No  Homicidal Thoughts:  No  Memory:  Immediate;   Fair Recent;   Fair Remote;   Fair  Judgement:  Fair  Insight:  Fair  Psychomotor Activity:  Normal  Concentration:  Concentration: Fair and Attention Span: Fair  Recall:  [employer] of Knowledge:Fair  Language: Fair  Akathisia:  No  Handed:  Right  AIMS (if indicated):  na  Assets:  Communication Skills Desire for Improvement Housing Social Support  ADL's:  Intact  Cognition: WNL  Sleep:  Fair   Screenings: PHQ2-9     Office Visit from 07/10/2018 in [facility] Office Visit from 01/18/2018 in [facility] OTHER RESP ORIENTATION from 02/09/2016 in [facility]  PHQ-2 Total Score  6  2  5   PHQ-9 Total Score  8  -  23      Assessment and Plan: Maleak is a 37 year old Caucasian male who is married, on disability,lives in [place], has a history of depression, anxiety, pulmonary fibrosis, GERD, COPD, asthma, tachycardia, presented to the clinic today to establish care.  Patient is biologically predisposed given his family history.  Patient also has psychosocial stressors of his mother having health issues.  Patient currently denies any suicidality.  Patient has good social support system.  Patient denies any substance abuse problems.  Patient will benefit from continued medication management as well as psychotherapy  sessions.  Plan MDD PHQ 9 equals 2 Patient reports he wants to stay on the Lexapro. Effexor was recently added by his primary medical doctor.  Discussed with him that these 2 medications should not be combined together and that the Effexor may have been added to wean off the Lexapro gradually.  Patient however is hesitant to stop the Lexapro and reports he would rather stop the Effexor.  Hence we will discontinue Effexor. Refer patient for CBT.  For GAD GAD 7 equals 7 Continue Lexapro as prescribed Continue Klonopin as prescribed by his PMD.  Patient reports his primary medical doctor is currently working on weaning him off. Refer for CBT.  Panic disorder Referred for CBT. Lexapro and Klonopin as prescribed  Will get TSH -provided him lab slip to get it done at [facility].  Follow-up in clinic in 4 weeks or sooner if needed.  More than 50 % of the time was spent for psychoeducation and supportive psychotherapy and care coordination.  This note was generated in part or whole with voice recognition software. Voice recognition is usually quite accurate but there are transcription errors that can and very often do occur. I apologize for any typographical errors that were not detected and corrected.       Jomarie Longs, MD 11/14/20195:33 PM

## 2018-09-14 ENCOUNTER — Other Ambulatory Visit: Payer: Self-pay | Admitting: Psychiatry

## 2018-09-15 LAB — TSH: TSH: 1.55 u[IU]/mL (ref 0.450–4.500)

## 2018-09-17 ENCOUNTER — Ambulatory Visit: Payer: Self-pay | Admitting: Internal Medicine

## 2018-09-24 ENCOUNTER — Encounter: Payer: Self-pay | Admitting: Licensed Clinical Social Worker

## 2018-09-24 ENCOUNTER — Ambulatory Visit (INDEPENDENT_AMBULATORY_CARE_PROVIDER_SITE_OTHER): Payer: Medicare Other | Admitting: Licensed Clinical Social Worker

## 2018-09-24 DIAGNOSIS — F410 Panic disorder [episodic paroxysmal anxiety] without agoraphobia: Secondary | ICD-10-CM

## 2018-09-24 DIAGNOSIS — F330 Major depressive disorder, recurrent, mild: Secondary | ICD-10-CM

## 2018-09-24 DIAGNOSIS — F411 Generalized anxiety disorder: Secondary | ICD-10-CM | POA: Diagnosis not present
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
## 2018-10-01 ENCOUNTER — Ambulatory Visit (INDEPENDENT_AMBULATORY_CARE_PROVIDER_SITE_OTHER): Payer: Medicare Other | Admitting: Licensed Clinical Social Worker

## 2018-10-01 ENCOUNTER — Encounter: Payer: Self-pay | Admitting: Licensed Clinical Social Worker

## 2018-10-01 DIAGNOSIS — F330 Major depressive disorder, recurrent, mild: Secondary | ICD-10-CM

## 2018-10-01 DIAGNOSIS — F410 Panic disorder [episodic paroxysmal anxiety] without agoraphobia: Secondary | ICD-10-CM

## 2018-10-01 DIAGNOSIS — F411 Generalized anxiety disorder: Secondary | ICD-10-CM | POA: Diagnosis not present

## 2018-10-01 NOTE — Progress Notes (Signed)
   THERAPIST PROGRESS NOTE  Session Time: 1230  Participation Level: Minimal  Behavioral Response: CasualAlertAnxious  Type of Therapy: Individual Therapy  Treatment Goals addressed: Anxiety  Interventions: CBT  Summary: Steven PomfretRandy L [name] is a 37 y.o. male who presents with symptoms related to his diagnosis. Steven [name] arrived on time for his session, but when called back for his appointment by LCSW he pretended Steven [name] was not his name. He then stated he was testing LCSW to see if she knew his name. During the session, Steven [name] recounted the past week by reading his journal and reporting the times he felt anxious. When asked by LCSW what about certain situations (such as having responsibilities) produces anxiety, Steven [name] was not able to provide an answer. LCSW encouraged Steven [name] to think critically about the situation to attempt to figure out what about that scenario increases his anxiety. LCSW introduced the idea of a thought record. When presented, Steven [name] stated, "That looks like homework!" LCSW stated therapy and improving symptoms takes a lot of effort on Steven [name]'s part, and LCSW cannot "fix," the situation but rather provide Steven [name] with tools to improve his anxiety. Steven [name] stated, "I just want things to be fixed." LCSW explained therapy does not function in that manner, and he would have to put in work outside of sessions.  Steven [name] expressed understanding and agreement. LCSW explained the components of the thought record to Steven [name] who expressed understanding. Prior to leaving, Steven [name] again asked LCSW his name. LCSW set a boundary and stated this was not an appropriate behavior and not beneficial to his progress. Steven [name] expressed understanding.   Suicidal/Homicidal: No  Therapist Response: Steven [name] was argumentative at times throughout his session, and appears to want therapy to function as a "cure," for his anxiety without putting in additional work. LCSW was able to explain the function of therapy, and Steven [name]  expressed understanding. Throughout the session, Steven [name] was also often distracted by his cell phone, and instructed LCSW to "wait a second," several times. LCSW expressed this was not appropriate as well, and unless it was an emergency, phones should be on silent and put away during therapy sessions to fully utilize the time during session. Steven [name] expressed understanding and agreement with this boundary as well.   Plan: Return again in 2 weeks.  Diagnosis: Axis I: Generalized Anxiety Disorder    Axis II: No diagnosis    Heidi DachKelsey Jamarius Saha, LCSW 10/01/2018

## 2018-10-04 ENCOUNTER — Ambulatory Visit (INDEPENDENT_AMBULATORY_CARE_PROVIDER_SITE_OTHER): Payer: Medicare Other | Admitting: Adult Health

## 2018-10-04 ENCOUNTER — Encounter: Payer: Self-pay | Admitting: Adult Health

## 2018-10-04 VITALS — BP 130/94 | HR 105 | Resp 16 | Ht 67.0 in | Wt 247.0 lb

## 2018-10-04 DIAGNOSIS — R0602 Shortness of breath: Secondary | ICD-10-CM | POA: Diagnosis not present

## 2018-10-04 DIAGNOSIS — F411 Generalized anxiety disorder: Secondary | ICD-10-CM

## 2018-10-04 DIAGNOSIS — Z9981 Dependence on supplemental oxygen: Secondary | ICD-10-CM

## 2018-10-04 NOTE — Progress Notes (Signed)
[facility] [address] [place], [place] [address]  Pulmonary Sleep Medicine   Office Visit Note  Patient Name: Steven [name] DOB: Oct 08, 1981 MRN [id number]  Date of Service: 10/04/2018  Complaints/HPI: Patient was seen 3 months ago for shortness of breath and anxiety.  He had been sedentary and laying around the house in the months prior.  He was really anxious at that visit and today presents much more calm and engaged.  He has began eating again and states that he leaves the house at least once every day.  He feels like he is getting control over his anxiety.  Pt has gained 12 pounds, he is eating again.  He continues to use oxygen at night and sometimes during the day although since our last visit he cannot remove the last time to use the oxygen during the day.  He has done much letter about checking his heart rate and oxygen at home.  He knows now that his oxygen and pulse are fine and that he feels like he needs to check them it is most likely anxiety and he resist that urge.  Generally he appears to been well and will continue to follow-up with [place] as scheduled.  ROS  General: (-) fever, (-) chills, (-) night sweats, (-) weakness Skin: (-) rashes, (-) itching,. Eyes: (-) visual changes, (-) redness, (-) itching. Nose and Sinuses: (-) nasal stuffiness or itchiness, (-) postnasal drip, (-) nosebleeds, (-) sinus trouble. Mouth and Throat: (-) sore throat, (-) hoarseness. Neck: (-) swollen glands, (-) enlarged thyroid, (-) neck pain. Respiratory: - cough, (-) bloody sputum, - shortness of breath, - wheezing. Cardiovascular: - ankle swelling, (-) chest pain. Lymphatic: (-) lymph node enlargement. Neurologic: (-) numbness, (-) tingling. Psychiatric: (-) anxiety, (-) depression   Current Medication: Outpatient Encounter Medications as of 10/04/2018  Medication Sig  . acetaminophen (TYLENOL) 500 MG tablet Take 1,000 mg by mouth every 6 (six) hours as needed for  moderate pain.  Marland Kitchen albuterol (PROVENTIL) (2.5 MG/3ML) 0.083% nebulizer solution Inhale 3 mLs (2.5 mg total) into the lungs every 6 (six) hours as needed for wheezing or shortness of breath.  . ATROVENT HFA 17 MCG/ACT inhaler Inhale 2 puffs into the lungs every 6 (six) hours as needed for wheezing.  Marland Kitchen BREO ELLIPTA 100-25 MCG/INH AEPB Inhale 1 puff into the lungs daily.  . clonazePAM (KLONOPIN) 1 MG tablet Take 1 mg by mouth 3 (three) times daily as needed for anxiety.   . COMBIVENT RESPIMAT 20-100 MCG/ACT AERS respimat Inhale 1 puff into the lungs every 6 (six) hours as needed for wheezing.  Marland Kitchen EPIPEN 2-PAK 0.3 MG/0.3ML SOAJ injection 0.3 mg.   . escitalopram (LEXAPRO) 20 MG tablet Take 20 mg by mouth daily.  . fexofenadine (ALLEGRA) 180 MG tablet Take 180 mg by mouth daily as needed for allergies.   . fluticasone (FLONASE) 50 MCG/ACT nasal spray Place 1 spray into both nostrils daily.  . metoprolol succinate (TOPROL XL) 25 MG 24 hr tablet Take 1 tablet (25 mg total) by mouth daily.  Marland Kitchen omeprazole (PRILOSEC) 20 MG capsule Take 20 mg by mouth 2 (two) times daily before a meal.   . OXYGEN Inhale into the lungs. 2LITERS AT NIGHT AND WHEN SICK  . PROAIR HFA 108 (90 Base) MCG/ACT inhaler INAHLE 2 PUFFS BY MOUTH EVERY 4 TO 6 HOURS AS NEEDED   No facility-administered encounter medications on file as of 10/04/2018.     Surgical History: Past Surgical History:  Procedure  Laterality Date  . ESOPHAGOGASTRODUODENOSCOPY (EGD) WITH PROPOFOL N/A 09/05/2017   Procedure: ESOPHAGOGASTRODUODENOSCOPY (EGD) WITH PROPOFOL;  Surgeon: Toledo, Boykin Nearing, MD;  Location: [facility] ENDOSCOPY;  Service: Gastroenterology;  Laterality: N/A;  . none    . TUBES IN EARS      Medical History: Past Medical History:  Diagnosis Date  . Anxiety   . Arthritis    knee  . Asthma   . Chronic mental illness   . COPD (chronic obstructive pulmonary disease) (HCC)   . Depression   . GERD (gastroesophageal reflux disease)   . Heart  palpitations   . Panic attack   . Pulmonary fibrosis (HCC)     Family History: Family History  Problem Relation Age of Onset  . COPD Mother   . Asthma Mother   . Mental illness Mother   . COPD Father   . Pulmonary fibrosis Father   . Lung cancer Father     Social History: Social History   Socioeconomic History  . Marital status: Married    Spouse name: heather  . Number of children: 4  . Years of education: Not on file  . Highest education level: Associate degree: occupational, Scientist, product/process development, or vocational program  Occupational History  . Not on file  Social Needs  . Financial resource strain: Not hard at all  . Food insecurity:    Worry: Never true    Inability: Never true  . Transportation needs:    Medical: No    Non-medical: No  Tobacco Use  . Smoking status: Former Smoker    Packs/day: 0.50    Years: 2.00    Pack years: 1.00    Types: Cigarettes    Last attempt to quit: 10/31/2000    Years since quitting: 17.9  . Smokeless tobacco: Current User    Types: Chew  . Tobacco comment: 2003  Substance and Sexual Activity  . Alcohol use: No    Alcohol/week: 0.0 standard drinks  . Drug use: No  . Sexual activity: Yes  Lifestyle  . Physical activity:    Days per week: 0 days    Minutes per session: 0 min  . Stress: Rather much  Relationships  . Social connections:    Talks on phone: Not on file    Gets together: Not on file    Attends religious service: Never    Active member of club or organization: No    Attends meetings of clubs or organizations: Never    Relationship status: Married  . Intimate partner violence:    Fear of current or ex partner: No    Emotionally abused: No    Physically abused: No    Forced sexual activity: No  Other Topics Concern  . Not on file  Social History Narrative  . Not on file    Vital Signs: Blood pressure (!) 130/94, pulse (!) 105, resp. rate 16, height 5\' 7"  (1.702 m), weight 247 lb (112 kg), SpO2 94  %.  Examination: General Appearance: The patient is well-developed, well-nourished, and in no distress. Skin: Gross inspection of skin unremarkable. Head: normocephalic, no gross deformities. Eyes: no gross deformities noted. ENT: ears appear grossly normal no exudates. Neck: Supple. No thyromegaly. No LAD. Respiratory: clear to auscultation. Cardiovascular: Normal S1 and S2 without murmur or rub. Extremities: No cyanosis. pulses are equal. Neurologic: Alert and oriented. No involuntary movements.  LABS: Recent Results (from the past 2160 hour(s))  TSH     Status: None   Collection Time: 09/14/18  11:28 AM  Result Value Ref Range   TSH 1.550 0.450 - 4.500 uIU/mL    Radiology: Ct Angio Chest Pe W And/or Wo Contrast  Result Date: 06/09/2018 CLINICAL DATA:  Patient c/o shortness of breath x1 month that is progressively getting worse. Patient states some chest tightness. Patient states seen here in ED and at [facility] ED for tachycardia and palpitations. Patient states placed on metoprolol 25mg  x2 weeks ago Patient reports improvement in heart rate and decrease in palpitations until medication was decreased to 12.5mg . EXAM: CT ANGIOGRAPHY CHEST WITH CONTRAST TECHNIQUE: Multidetector CT imaging of the chest was performed using the standard protocol during bolus administration of intravenous contrast. Multiplanar CT image reconstructions and MIPs were obtained to evaluate the vascular anatomy. CONTRAST:  75mL ISOVUE-370 IOPAMIDOL (ISOVUE-370) INJECTION 76% COMPARISON:  Chest radiograph, 05/25/2018. FINDINGS: Cardiovascular: Contrast opacification is somewhat suboptimal, with relatively equal opacification of the aorta and pulmonary arteries. This along with relatively low lung volumes, as well as respiratory motion, limits assessment for segmental and subsegmental pulmonary emboli. Allowing for the limitations, there is no evidence of a central pulmonary embolus. Heart is normal in size and  configuration. No pericardial effusion. No coronary artery calcifications. Great vessels are normal caliber. No aortic dissection or atherosclerosis. Mediastinum/Nodes: Borderline enlarged right neck base lymph node measuring 11 mm in short axis. Thyroid is unremarkable. No other neck base adenopathy. No mediastinal or hilar masses. No enlarged lymph nodes. Trachea and esophagus are unremarkable. Lungs/Pleura: Mild subsegmental atelectasis in the lower lobes. Lungs otherwise clear with no evidence of pneumonia or pulmonary edema. No lung mass or nodule. No pleural effusion or pneumothorax. Upper Abdomen: Unremarkable. Musculoskeletal: No chest wall abnormality. No acute or significant osseous findings. Review of the MIP images confirms the above findings. IMPRESSION: 1. Exam limited for the assessment of segmental and subsegmental PE. Allowing for this, there is no evidence of a central pulmonary embolus. 2. No acute findings. 3. Mild subsegmental lower lobe atelectasis. No other abnormalities. Electronically Signed   By: Amie Portland M.D.   On: 06/09/2018 15:13    No results found.  No results found.    Assessment and Plan: Patient Active Problem List   Diagnosis Date Noted  . Heart palpitations 05/31/2018  . Anxiety, generalized 05/31/2018  . Asthma, chronic 04/05/2015  . Chronic obstructive airway disease with asthma (HCC) 03/17/2015  . Obesity 03/17/2015  . DOE (dyspnea on exertion) 03/17/2015  . Environmental allergies 04/03/2014  . Chest pain 08/02/2013  . Thyroid nodule 03/21/2013   1. GAD (generalized anxiety disorder) Patient appears to have better control of his anxiety at this time.  He is smiling and interacting in the room and is okay with the door being shut at this time.  He reports he has been getting up and out of the house every day which is a great improvement for him.  2. Dependence on nocturnal oxygen therapy Patient continue to use nocturnal oxygen as prescribed.  He  verbalized understanding of this.  3. Morbid obesity (HCC) Unprompted, patient reports he is looking for a use treadmill as he would like to start walking.  He reports it is cold outside and he rather walk on a treadmill inside it first while he can hold up his distance and tolerance. Obesity Counseling: Risk Assessment: An assessment of behavioral risk factors was made today and includes lack of exercise sedentary lifestyle, lack of portion control and poor dietary habits.  Risk Modification Advice: She was counseled on portion control  guidelines. Restricting daily caloric intake to. . The detrimental long term effects of obesity on her health and ongoing poor compliance was also discussed with the patient.  4. SOB (shortness of breath) Patient's FVC is 3.4 which is 69% of the pre-predicted value. His FEV1 is 2.0 which 50% of the pre-predicted value. His FEV1/FVC is 59% which is 73% of the pre-predicted value. - Spirometry with Graph   General Counseling: I have discussed the findings of the evaluation and examination with Steven [name].  I have also discussed any further diagnostic evaluation thatmay be needed or ordered today. Winner verbalizes understanding of the findings of todays visit. We also reviewed his medications today and discussed drug interactions and side effects including but not limited excessive drowsiness and altered mental states. We also discussed that there is always a risk not just to him but also people around him. he has been encouraged to call the office with any questions or concerns that should arise related to todays visit.    Time spent: 25 This patient was seen by Blima LedgerAdam Zyiere Rosemond AGNP-C in Collaboration with Dr. Freda MunroSaadat Khan as a part of collaborative care agreement.   I have personally obtained a history, examined the patient, evaluated laboratory and imaging results, formulated the assessment and plan and placed orders.    Yevonne PaxSaadat A Khan, MD [facility] Pulmonary and  Critical Care Sleep medicine

## 2018-10-04 NOTE — Patient Instructions (Signed)

## 2018-10-11 ENCOUNTER — Ambulatory Visit (INDEPENDENT_AMBULATORY_CARE_PROVIDER_SITE_OTHER): Payer: Medicare Other | Admitting: Psychiatry

## 2018-10-11 ENCOUNTER — Encounter: Payer: Self-pay | Admitting: Licensed Clinical Social Worker

## 2018-10-11 ENCOUNTER — Other Ambulatory Visit: Payer: Self-pay

## 2018-10-11 ENCOUNTER — Ambulatory Visit (INDEPENDENT_AMBULATORY_CARE_PROVIDER_SITE_OTHER): Payer: Medicare Other | Admitting: Licensed Clinical Social Worker

## 2018-10-11 ENCOUNTER — Encounter: Payer: Self-pay | Admitting: Psychiatry

## 2018-10-11 VITALS — BP 139/88 | HR 80 | Wt 249.6 lb

## 2018-10-11 DIAGNOSIS — R1012 Left upper quadrant pain: Secondary | ICD-10-CM

## 2018-10-11 DIAGNOSIS — F330 Major depressive disorder, recurrent, mild: Secondary | ICD-10-CM

## 2018-10-11 DIAGNOSIS — F410 Panic disorder [episodic paroxysmal anxiety] without agoraphobia: Secondary | ICD-10-CM

## 2018-10-11 DIAGNOSIS — Z91018 Allergy to other foods: Secondary | ICD-10-CM | POA: Insufficient documentation

## 2018-10-11 DIAGNOSIS — F411 Generalized anxiety disorder: Secondary | ICD-10-CM | POA: Diagnosis not present

## 2018-10-11 DIAGNOSIS — K30 Functional dyspepsia: Secondary | ICD-10-CM | POA: Insufficient documentation

## 2018-10-11 DIAGNOSIS — G8929 Other chronic pain: Secondary | ICD-10-CM | POA: Insufficient documentation

## 2018-10-11 MED ORDER — CLONAZEPAM 1 MG PO TABS: 0.5000 mg | ORAL_TABLET | ORAL | 0 refills | Status: DC

## 2018-10-11 MED ORDER — ESCITALOPRAM OXALATE 20 MG PO TABS: 20.0000 mg | ORAL_TABLET | Freq: Every day | ORAL | 0 refills | Status: DC

## 2018-10-11 NOTE — Progress Notes (Signed)
   THERAPIST PROGRESS NOTE  Session Time: 1330  Participation Level: Minimal  Behavioral Response: DisheveledAlertAnxious  Type of Therapy: Individual Therapy  Treatment Goals addressed: Anxiety  Interventions: CBT  Summary: Steven PomfretRandy L [name] is a 37 y.o. male who presents with symptoms related to his diagnosis. [name] reports feeling much better over the last three days and stated he was recently taken off a heart medication which he believes may have been a primary contributing factor in his anxiety. [name] reports he did not bring in his thought record, but was able to recount the situation he noted on it previously. LCSW and [name] walked through the thought record, and he was able to provide answers for each column and was able to accept input from LCSW. [name] had difficulty formulating evidence that did not support his distorted thought though with help from LCSW he was able to do so. [name] reported feeling anxious about the upcoming holiday and the family expectations which had been set for him. LCSW and [name] discussed the worst case scenarios of this expectation, and ways that he could improve the outcome of the scenario. [name] expressed decreased anxiety after discussing various possibilities other than fixating on the one possible outcome of disappointing his family.   Suicidal/Homicidal: No  Therapist Response: [name] was able to understand concepts presented to him, and was much more willing to participate in discussions this week versus last session. We will continue to utilize CBT moving forward to assist [name] in managing his anxiety.   Plan: Return again in 2 weeks.  Diagnosis: Axis I: Generalized Anxiety Disorder    Axis II: No diagnosis    Heidi DachKelsey Jamielynn Wigley, LCSW 10/11/2018

## 2018-10-11 NOTE — Progress Notes (Signed)
BH MD  OP Progress Note  10/11/2018 1:49 PM 37 Steven [name]  MRN:  [id number]  Chief Complaint: ' I am here for follow up." Chief Complaint    Follow-up     HPI: Steven [name] is 37 year old Caucasian male on SSD, lives in [place], is a history of anxiety, depression, panic disorder, tachycardia, asthma, COPD, urinary fibrosis, history of thyroid nodule, GERD, history of cardiomyopathy, presented to the clinic today for a follow-up visit.  Patient today reports he is currently only taking the Lexapro and the Klonopin.  He stopped taking the Effexor as advised last visit.  He reports he also came off of the metoprolol.  He reports he feels much better with regards to his anxiety and depressive symptoms since being off of the metoprolol.  Patient reports he wants to continue the Lexapro as it is for now.  Patient reports he continues to take Klonopin 1 mg twice a day.  Discussed with patient that the Klonopin needs to be tapered down and weaned off gradually since its not to be continued for long-term.  Provided medication education and discussed long-term risk of being on benzodiazepine therapy.  Patient's Klonopin is currently being prescribed by his provider [name].  Provided instructions to taper it off.  Patient denies any suicidality.  Patient reports sleep is good.  Patient reports therapy sessions is going well, he sees her therapist [name] here in clinic.   Visit Diagnosis:    ICD-10-CM   1. GAD (generalized anxiety disorder) F41.1    improving  2. Panic disorder F41.0    improving  3. MDD (major depressive disorder), recurrent episode, mild (HCC) F33.0    improved    Past Psychiatric History: I have reviewed past psychiatric history from my progress note on 09/13/2018.  Past trials of BuSpar, Wellbutrin, Abilify, Klonopin, Lexapro and Effexor.  Past Medical History:  Past Medical History:  Diagnosis Date  . Anxiety   . Arthritis    knee  . Asthma   .  Chronic mental illness   . COPD (chronic obstructive pulmonary disease) (HCC)   . Depression   . GERD (gastroesophageal reflux disease)   . Heart palpitations   . Panic attack   . Pulmonary fibrosis (HCC)     Past Surgical History:  Procedure Laterality Date  . ESOPHAGOGASTRODUODENOSCOPY (EGD) WITH PROPOFOL N/A 09/05/2017   Procedure: ESOPHAGOGASTRODUODENOSCOPY (EGD) WITH PROPOFOL;  Surgeon: Toledo, Boykin Nearingeodoro K, MD;  Location: [facility] ENDOSCOPY;  Service: Gastroenterology;  Laterality: N/A;  . none    . TUBES IN EARS      Family Psychiatric History: Reviewed family psychiatric history from my progress note on 09/13/2018  Family History:  Family History  Problem Relation Age of Onset  . COPD Mother   . Asthma Mother   . Mental illness Mother   . COPD Father   . Pulmonary fibrosis Father   . Lung cancer Father     Social History: Reviewed social history from my progress note on 09/13/2018 Social History   Socioeconomic History  . Marital status: Married    Spouse name: heather  . Number of children: 4  . Years of education: Not on file  . Highest education level: Associate degree: occupational, Scientist, product/process developmenttechnical, or vocational program  Occupational History  . Not on file  Social Needs  . Financial resource strain: Not hard at all  . Food insecurity:    Worry: Never true    Inability: Never true  . Transportation needs:  Medical: No    Non-medical: No  Tobacco Use  . Smoking status: Former Smoker    Packs/day: 0.50    Years: 2.00    Pack years: 1.00    Types: Cigarettes    Last attempt to quit: 10/31/2000    Years since quitting: 17.9  . Smokeless tobacco: Current User    Types: Chew  . Tobacco comment: 2003  Substance and Sexual Activity  . Alcohol use: No    Alcohol/week: 0.0 standard drinks  . Drug use: No  . Sexual activity: Yes  Lifestyle  . Physical activity:    Days per week: 0 days    Minutes per session: 0 min  . Stress: Rather much  Relationships  .  Social connections:    Talks on phone: Not on file    Gets together: Not on file    Attends religious service: Never    Active member of club or organization: No    Attends meetings of clubs or organizations: Never    Relationship status: Married  Other Topics Concern  . Not on file  Social History Narrative  . Not on file    Allergies:  Allergies  Allergen Reactions  . Diphenhydramine Anaphylaxis  . Guaifenesin Swelling    Bodily Swelling  . Doxycycline     tachycardia  . Amoxicillin Nausea Only    Other reaction(s): Other (See Comments) Increases anxiety Other reaction(s): Other (See Comments) Increases anxiety    Metabolic Disorder Labs: No results found for: HGBA1C, MPG No results found for: PROLACTIN No results found for: CHOL, TRIG, HDL, CHOLHDL, VLDL, LDLCALC Lab Results  Component Value Date   TSH 1.550 09/14/2018    Therapeutic Level Labs: No results found for: LITHIUM No results found for: VALPROATE No components found for:  CBMZ  Current Medications: Current Outpatient Medications  Medication Sig Dispense Refill  . acetaminophen (TYLENOL) 500 MG tablet Take 1,000 mg by mouth every 6 (six) hours as needed for moderate pain.    Marland Kitchen albuterol (PROVENTIL) (2.5 MG/3ML) 0.083% nebulizer solution Inhale 3 mLs (2.5 mg total) into the lungs every 6 (six) hours as needed for wheezing or shortness of breath. 75 mL 4  . ATROVENT HFA 17 MCG/ACT inhaler Inhale 2 puffs into the lungs every 6 (six) hours as needed for wheezing. 1 Inhaler 0  . BREO ELLIPTA 100-25 MCG/INH AEPB Inhale 1 puff into the lungs daily. 1 each 3  . clonazePAM (KLONOPIN) 1 MG tablet Take 0.5-1 tablets (0.5-1 mg total) by mouth as directed. 60 tablet 0  . COMBIVENT RESPIMAT 20-100 MCG/ACT AERS respimat Inhale 1 puff into the lungs every 6 (six) hours as needed for wheezing. 1 Inhaler 2  . EPIPEN 2-PAK 0.3 MG/0.3ML SOAJ injection 0.3 mg.     . escitalopram (LEXAPRO) 20 MG tablet Take 1 tablet (20 mg  total) by mouth daily. 90 tablet 0  . fexofenadine (ALLEGRA) 180 MG tablet Take 180 mg by mouth daily as needed for allergies.     . fluticasone (FLONASE) 50 MCG/ACT nasal spray Place 1 spray into both nostrils daily.    Marland Kitchen omeprazole (PRILOSEC) 20 MG capsule Take 20 mg by mouth 2 (two) times daily before a meal.     . OXYGEN Inhale into the lungs. 2LITERS AT NIGHT AND WHEN SICK    . PROAIR HFA 108 (90 Base) MCG/ACT inhaler INAHLE 2 PUFFS BY MOUTH EVERY 4 TO 6 HOURS AS NEEDED 8.5 g 0   No current facility-administered medications for  this visit.      Musculoskeletal: Strength & Muscle Tone: within normal limits Gait & Station: normal Patient leans: N/A  Psychiatric Specialty Exam: Review of Systems  Psychiatric/Behavioral: The patient is nervous/anxious (improved).   All other systems reviewed and are negative.   Blood pressure 139/88, pulse 80, weight 249 lb 9.6 oz (113.2 kg).Body mass index is 39.09 kg/m.  General Appearance: Casual  Eye Contact:  Fair  Speech:  Clear and Coherent  Volume:  Normal  Mood:  Euthymic  Affect:  Congruent  Thought Process:  Goal Directed and Descriptions of Associations: Intact  Orientation:  Full (Time, Place, and Person)  Thought Content: Logical   Suicidal Thoughts:  No  Homicidal Thoughts:  No  Memory:  Immediate;   Fair Recent;   Fair Remote;   Fair  Judgement:  Fair  Insight:  Fair  Psychomotor Activity:  Normal  Concentration:  Concentration: Fair and Attention Span: Fair  Recall:  [employer] of Knowledge: Fair  Language: Fair  Akathisia:  No  Handed:  Right  AIMS (if indicated): Denies tremors, rigidity, stiffness  Assets:  Communication Skills Desire for Improvement Social Support  ADL's:  Intact  Cognition: WNL  Sleep:  Fair   Screenings: PHQ2-9     Office Visit from 07/10/2018 in [facility], [facility] Office Visit from 01/18/2018 in [facility] PUL[facility] OTHER RESP ORIENTATION from 02/09/2016  in [facility]  PHQ-2 Total Score  6  2  5   PHQ-9 Total Score  8  -  23       Assessment and Plan: Steven [name] is a 37 year old Caucasian male who is married, on disability, lives in [place], has a history of depression, anxiety, pulmonary fibrosis, GERD, COPD, asthma, tachycardia, presented to the clinic today for a follow-up visit.  Patient is biologically predisposed given his family history.  He also has psychosocial stressors of his mother's health issues.  Patient is currently making progress on the Lexapro.  Patient agrees to taper of Klonopin.  He will continue psychotherapy sessions.  Plan MDD Continue Lexapro 20 mg p.o. daily  GAD Patient provided instructions to taper of Klonopin.  He will start cutting Klonopin to half milligram on Monday and Thursday p.m.  Rest of the days p.m. he will continue 1 mg.  He will continue 1 mg daily in the a.m. as he is taking now. I have reviewed [place] controlled substance database.  Panic disorder He will continue CBT.  Lexapro and Klonopin as prescribed  Have reviewed TSH- 09/14/2018-within normal limits  Follow-up in clinic in 3 weeks or sooner if needed.  More than 50 % of the time was spent for psychoeducation and supportive psychotherapy and care coordination.  This note was generated in part or whole with voice recognition software. Voice recognition is usually quite accurate but there are transcription errors that can and very often do occur. I apologize for any typographical errors that were not detected and corrected.         Jomarie Longs, MD 10/11/2018, 1:49 PM

## 2018-10-11 NOTE — Patient Instructions (Signed)
Start taking Klonopin 1 mg daily in the AM .  Start taking half a dose of Klonopin , that is 0.5 mg on Monday and Thursday PM and rest of the day continue 1 mg PM.

## 2018-10-22 ENCOUNTER — Encounter: Payer: Self-pay | Admitting: Licensed Clinical Social Worker

## 2018-10-22 ENCOUNTER — Ambulatory Visit (INDEPENDENT_AMBULATORY_CARE_PROVIDER_SITE_OTHER): Payer: Medicare Other | Admitting: Licensed Clinical Social Worker

## 2018-10-22 DIAGNOSIS — F411 Generalized anxiety disorder: Secondary | ICD-10-CM | POA: Diagnosis not present

## 2018-10-22 NOTE — Progress Notes (Signed)
   THERAPIST PROGRESS NOTE  Session Time: 1230  Participation Level: Active  Behavioral Response: CasualAlertAnxious  Type of Therapy: Individual Therapy  Treatment Goals addressed: Anxiety  Interventions: CBT  Summary: Steven PomfretRandy L [name] is a 37 y.o. male who presents with symptoms related to his diagnosis. Steven [name] reports his anxiety has continued to decrease to a level that he feels more able to manage. He reports he stayed with his wife at their home over the last week, and stated "it felt weird, not like my home anymore--but I was able to do it. I just had one moment of anxiety and I just went to sleep and felt better." Steven [name] reports feeling anxious about the holiday as his sister-in-law will be there with her boyfriend. Steven [name] stated he has a history with the sister-in-law's boyfriend, and feels nervous about them bringing "drama to the event." We discussed ways Steven [name] could decrease his anxiety around the situation, which Steven [name] was able to understand. Steven [name] stated he has been utilizing the thought record to "coach himself through anxious moments." LCSW encouraged Steven [name] to have a plan to manage his anxiety should it arise on Christmas. Steven [name] reported he planned to "step into the back bedroom or step outside."   Suicidal/Homicidal: No  Therapist Response: Steven [name] continues to show improvements in his anxiety symptoms. He has utilized skills learned in previous sessions and applies them to his daily life. We will continue to utilize CBT moving forward to assist Steven [name] in managing his anxiety symptoms.   Plan: Return again in 2 weeks.  Diagnosis: Axis I: Generalized Anxiety Disorder    Axis II: No diagnosis    Steven DachKelsey Davi Rotan, LCSW 10/22/2018

## 2018-11-01 ENCOUNTER — Other Ambulatory Visit: Payer: Self-pay

## 2018-11-01 ENCOUNTER — Encounter: Payer: Self-pay | Admitting: Psychiatry

## 2018-11-01 ENCOUNTER — Ambulatory Visit (INDEPENDENT_AMBULATORY_CARE_PROVIDER_SITE_OTHER): Payer: Medicare Other | Admitting: Psychiatry

## 2018-11-01 VITALS — BP 131/88 | HR 97 | Temp 97.6°F | Wt 254.4 lb

## 2018-11-01 DIAGNOSIS — F410 Panic disorder [episodic paroxysmal anxiety] without agoraphobia: Secondary | ICD-10-CM | POA: Diagnosis not present

## 2018-11-01 DIAGNOSIS — F330 Major depressive disorder, recurrent, mild: Secondary | ICD-10-CM

## 2018-11-01 DIAGNOSIS — F411 Generalized anxiety disorder: Secondary | ICD-10-CM | POA: Diagnosis not present

## 2018-11-01 MED ORDER — CLONAZEPAM 1 MG PO TABS: 0.5000 mg | ORAL_TABLET | ORAL | 0 refills | Status: DC

## 2018-11-01 MED ORDER — HYDROXYZINE HCL 25 MG PO TABS: 25.0000 mg | ORAL_TABLET | ORAL | 2 refills | Status: DC

## 2018-11-01 NOTE — Progress Notes (Signed)
BH MD OP Progress Note  11/01/2018 5:15 PM Steven PomfretRandy L [name]  MRN:  [id number]  Chief Complaint:  Chief Complaint    Follow-up; Medication Refill    ' I am here for follow up.'   HPI: Steven [name] is a 38 year old Caucasian male on SSD, lives in [place], has a history of anxiety, depression, panic disorder, tachycardia, asthma, COPD, urinary fibrosis, history of thyroid nodule, GERD, history of cardiomyopathy, presented to the clinic today for a follow-up visit.  Patient today reports he is currently doing well on the current medication regimen.  Denies any significant anxiety or depressive symptoms.  He is compliant with his Lexapro.  He reports he has been cutting down on the Klonopin as discussed last visit.  He reports he takes Klonopin 1 tablet daily in the a.m. and has actually skipped his p.m. dose at least 2 times a week.  Patient was advised to take half a dose 2 times a week however reports today that he just stopped taking it p.m. at least twice a week.  Discussed with patient that since he does not have any withdrawal symptoms it is okay to continue to do so and he could continue to wean himself off of the Klonopin.  Provided him instructions to do so.  Patient reports sleep is fair.  He reports psychotherapy visits is going well.  Patient denies any suicidality, homicidality or perceptual disturbances. Visit Diagnosis:    ICD-10-CM   1. GAD (generalized anxiety disorder) F41.1   2. Panic disorder F41.0   3. MDD (major depressive disorder), recurrent episode, mild (HCC) F33.0     Past Psychiatric History: Reviewed past psychiatric history from my progress note on 09/13/2018.  Past trials of BuSpar, Wellbutrin, Abilify, Klonopin, Lexapro, Effexor.  Past Medical History:  Past Medical History:  Diagnosis Date  . Anxiety   . Arthritis    knee  . Asthma   . Chronic mental illness   . COPD (chronic obstructive pulmonary disease) (HCC)   . Depression   . GERD (gastroesophageal  reflux disease)   . Heart palpitations   . Panic attack   . Pulmonary fibrosis (HCC)     Past Surgical History:  Procedure Laterality Date  . ESOPHAGOGASTRODUODENOSCOPY (EGD) WITH PROPOFOL N/A 09/05/2017   Procedure: ESOPHAGOGASTRODUODENOSCOPY (EGD) WITH PROPOFOL;  Surgeon: Toledo, Boykin Nearingeodoro K, MD;  Location: [facility] ENDOSCOPY;  Service: Gastroenterology;  Laterality: N/A;  . none    . TUBES IN EARS      Family Psychiatric History: Reviewed family psychiatric history from my progress note on 09/13/2018  Family History:  Family History  Problem Relation Age of Onset  . COPD Mother   . Asthma Mother   . Mental illness Mother   . COPD Father   . Pulmonary fibrosis Father   . Lung cancer Father     Social History: I have reviewed social history from my progress note on 09/13/2018 Social History   Socioeconomic History  . Marital status: Married    Spouse name: heather  . Number of children: 4  . Years of education: Not on file  . Highest education level: Associate degree: occupational, Scientist, product/process developmenttechnical, or vocational program  Occupational History  . Not on file  Social Needs  . Financial resource strain: Not hard at all  . Food insecurity:    Worry: Never true    Inability: Never true  . Transportation needs:    Medical: No    Non-medical: No  Tobacco Use  . Smoking status: Former  Smoker    Packs/day: 0.50    Years: 2.00    Pack years: 1.00    Types: Cigarettes    Last attempt to quit: 10/31/2000    Years since quitting: 18.0  . Smokeless tobacco: Current User    Types: Chew  . Tobacco comment: 2003  Substance and Sexual Activity  . Alcohol use: No    Alcohol/week: 0.0 standard drinks  . Drug use: No  . Sexual activity: Yes  Lifestyle  . Physical activity:    Days per week: 0 days    Minutes per session: 0 min  . Stress: Rather much  Relationships  . Social connections:    Talks on phone: Not on file    Gets together: Not on file    Attends religious service: Never     Active member of club or organization: No    Attends meetings of clubs or organizations: Never    Relationship status: Married  Other Topics Concern  . Not on file  Social History Narrative  . Not on file    Allergies:  Allergies  Allergen Reactions  . Diphenhydramine Anaphylaxis  . Guaifenesin Swelling    Bodily Swelling  . Doxycycline     tachycardia  . Amoxicillin Nausea Only    Other reaction(s): Other (See Comments) Increases anxiety Other reaction(s): Other (See Comments) Increases anxiety    Metabolic Disorder Labs: No results found for: HGBA1C, MPG No results found for: PROLACTIN No results found for: CHOL, TRIG, HDL, CHOLHDL, VLDL, LDLCALC Lab Results  Component Value Date   TSH 1.550 09/14/2018    Therapeutic Level Labs: No results found for: LITHIUM No results found for: VALPROATE No components found for:  CBMZ  Current Medications: Current Outpatient Medications  Medication Sig Dispense Refill  . acetaminophen (TYLENOL) 500 MG tablet Take 1,000 mg by mouth every 6 (six) hours as needed for moderate pain.    Marland Kitchen albuterol (PROVENTIL) (2.5 MG/3ML) 0.083% nebulizer solution Inhale 3 mLs (2.5 mg total) into the lungs every 6 (six) hours as needed for wheezing or shortness of breath. 75 mL 4  . ATROVENT HFA 17 MCG/ACT inhaler Inhale 2 puffs into the lungs every 6 (six) hours as needed for wheezing. 1 Inhaler 0  . BREO ELLIPTA 100-25 MCG/INH AEPB Inhale 1 puff into the lungs daily. 1 each 3  . [START ON 11/06/2018] clonazePAM (KLONOPIN) 1 MG tablet Take 0.5-1 tablets (0.5-1 mg total) by mouth as directed. For anxiety 56 tablet 0  . COMBIVENT RESPIMAT 20-100 MCG/ACT AERS respimat Inhale 1 puff into the lungs every 6 (six) hours as needed for wheezing. 1 Inhaler 2  . EPIPEN 2-PAK 0.3 MG/0.3ML SOAJ injection 0.3 mg.     . escitalopram (LEXAPRO) 20 MG tablet Take 1 tablet (20 mg total) by mouth daily. 90 tablet 0  . fexofenadine (ALLEGRA) 180 MG tablet Take 180 mg  by mouth daily as needed for allergies.     . fluticasone (FLONASE) 50 MCG/ACT nasal spray Place 1 spray into both nostrils daily.    Marland Kitchen omeprazole (PRILOSEC) 20 MG capsule Take 20 mg by mouth 2 (two) times daily before a meal.     . OXYGEN Inhale into the lungs. 2LITERS AT NIGHT AND WHEN SICK    . PROAIR HFA 108 (90 Base) MCG/ACT inhaler INAHLE 2 PUFFS BY MOUTH EVERY 4 TO 6 HOURS AS NEEDED 8.5 g 0  . hydrOXYzine (ATARAX/VISTARIL) 25 MG tablet Take 1 tablet (25 mg total) by mouth as directed.  Take half to one tablet daily as needed and 2 tablet at bedtime- anxiety,sleep 75 tablet 2   No current facility-administered medications for this visit.      Musculoskeletal: Strength & Muscle Tone: within normal limits Gait & Station: normal Patient leans: N/A  Psychiatric Specialty Exam: Review of Systems  Psychiatric/Behavioral: Negative for depression. The patient is not nervous/anxious.   All other systems reviewed and are negative.   Blood pressure 131/88, pulse 97, temperature 97.6 F (36.4 C), temperature source Oral, weight 254 lb 6.4 oz (115.4 kg).Body mass index is 39.84 kg/m.  General Appearance: Casual  Eye Contact:  Fair  Speech:  Clear and Coherent  Volume:  Normal  Mood:  Euthymic  Affect:  Appropriate  Thought Process:  Goal Directed and Descriptions of Associations: Intact  Orientation:  Full (Time, Place, and Person)  Thought Content: Logical   Suicidal Thoughts:  No  Homicidal Thoughts:  No  Memory:  Immediate;   Fair Recent;   Fair Remote;   Fair  Judgement:  Fair  Insight:  Fair  Psychomotor Activity:  Normal  Concentration:  Concentration: Fair and Attention Span: Fair  Recall:  [employer]  Fund of Knowledge: Fair  Language: Fair  Akathisia:  No  Handed:  Right  AIMS (if indicated): denies tremors, rigidity,stiffness  Assets:  Communication Skills Desire for Improvement Social Support  ADL's:  Intact  Cognition: WNL  Sleep:  Fair   Screenings: PHQ2-9      [facility] OTHER RESP ORIENTATION from [facility]09/2016 [facility]  PHQ-2 Total Score  6  2  5   PHQ-9 Total Score  8  -  23       Assessment and Plan: Steven [name] is a 38 year old Caucasian male who is married on disability, lives in [place], has a history of depression, anxiety, pulmonary fibrosis, GERD, COPD, asthma, tachycardia, presented to the clinic today for a follow-up visit.  Patient is biologically predisposed given his family history.  He also has psychosocial stressors of his mother's health issues.  He is currently making progress on the current medication regimen.  He will also continue psychotherapy sessions which are beneficial.  Plan MDD Continue Lexapro 20 mg p.o. daily.  For GAD Will continue to wean off Klonopin.  Discussed with patient to take half tablet of Klonopin on Saturday p.m. and rest of the days he will continue what he does now.  Patient currently skips Klonopin 2 days a week at p.m.-Mondays and Thursdays.  He will continue his usual dose in the a.m. Continue Lexapro.  Panic disorder Continue CBT.  Follow-up in clinic in 2 weeks or sooner if needed.  More than 50 % of the time was spent for psychoeducation and supportive psychotherapy and care coordination.  This note was generated in part or whole with voice recognition software. Voice recognition is usually quite accurate but there are transcription errors that can and very often do occur. I apologize for any typographical errors that were not detected and corrected.         Jomarie LongsSaramma Cooper Moroney, MD 11/01/2018, 5:15 PM

## 2018-11-01 NOTE — Patient Instructions (Addendum)
Hydroxyzine capsules or tablets What is this medicine? HYDROXYZINE (hye DROX i zeen) is an antihistamine. This medicine is used to treat allergy symptoms. It is also used to treat anxiety and tension. This medicine can be used with other medicines to induce sleep before surgery. This medicine may be used for other purposes; ask your health care provider or pharmacist if you have questions. COMMON BRAND NAME(S): ANX, Atarax, Rezine, Vistaril What should I tell my health care provider before I take this medicine? They need to know if you have any of these conditions: -glaucoma -heart disease -history of irregular heartbeat -kidney disease -liver disease -lung or breathing disease, like asthma -stomach or intestine problems -thyroid disease -trouble passing urine -an unusual or allergic reaction to hydroxyzine, cetirizine, other medicines, foods, dyes or preservatives -pregnant or trying to get pregnant -breast-feeding How should I use this medicine? Take this medicine by mouth with a full glass of water. Follow the directions on the prescription label. You may take this medicine with food or on an empty stomach. Take your medicine at regular intervals. Do not take your medicine more often than directed. Talk to your pediatrician regarding the use of this medicine in children. Special care may be needed. While this drug may be prescribed for children as young as 6 years of age for selected conditions, precautions do apply. Patients over 65 years old may have a stronger reaction and need a smaller dose. Overdosage: If you think you have taken too much of this medicine contact a poison control center or emergency room at once. NOTE: This medicine is only for you. Do not share this medicine with others. What if I miss a dose? If you miss a dose, take it as soon as you can. If it is almost time for your next dose, take only that dose. Do not take double or extra doses. What may interact with this  medicine? Do not take this medicine with any of the following medications: -cisapride -dofetilide -dronedarone -pimozide -thioridazine This medicine may also interact with the following medications: -alcohol -antihistamines for allergy, cough, and cold -atropine -barbiturate medicines for sleep or seizures, like phenobarbital -certain antibiotics like erythromycin or clarithromycin -certain medicines for anxiety or sleep -certain medicines for bladder problems like oxybutynin, tolterodine -certain medicines for depression or psychotic disturbances -certain medicines for irregular heart beat -certain medicines for Parkinson's disease like benztropine, trihexyphenidyl -certain medicines for seizures like phenobarbital, primidone -certain medicines for stomach problems like dicyclomine, hyoscyamine -certain medicines for travel sickness like scopolamine -ipratropium -narcotic medicines for pain -other medicines that prolong the QT interval (an abnormal heart rhythm) This list may not describe all possible interactions. Give your health care provider a list of all the medicines, herbs, non-prescription drugs, or dietary supplements you use. Also tell them if you smoke, drink alcohol, or use illegal drugs. Some items may interact with your medicine. What should I watch for while using this medicine? Tell your doctor or health care professional if your symptoms do not improve. You may get drowsy or dizzy. Do not drive, use machinery, or do anything that needs mental alertness until you know how this medicine affects you. Do not stand or sit up quickly, especially if you are an older patient. This reduces the risk of dizzy or fainting spells. Alcohol may interfere with the effect of this medicine. Avoid alcoholic drinks. Your mouth may get dry. Chewing sugarless gum or sucking hard candy, and drinking plenty of water may help. Contact your doctor if   the problem does not go away or is  severe. This medicine may cause dry eyes and blurred vision. If you wear contact lenses you may feel some discomfort. Lubricating drops may help. See your eye doctor if the problem does not go away or is severe. If you are receiving skin tests for allergies, tell your doctor you are using this medicine. What side effects may I notice from receiving this medicine? Side effects that you should report to your doctor or health care professional as soon as possible: -allergic reactions like skin rash, itching or hives, swelling of the face, lips, or tongue -changes in vision -confusion -fast, irregular heartbeat -seizures -tremor -trouble passing urine or change in the amount of urine Side effects that usually do not require medical attention (report to your doctor or health care professional if they continue or are bothersome): -constipation -drowsiness -dry mouth -headache -tiredness This list may not describe all possible side effects. Call your doctor for medical advice about side effects. You may report side effects to FDA at 1-800-FDA-1088. Where should I keep my medicine? Keep out of the reach of children. Store at room temperature between 15 and 30 degrees C (59 and 86 degrees F). Keep container tightly closed. Throw away any unused medicine after the expiration date. NOTE: This sheet is a summary. It may not cover all possible information. If you have questions about this medicine, talk to your doctor, pharmacist, or health care provider.  2019 Elsevier/Gold Standard (2018-04-30 13:25:13)   Start taking half a tablet of Klonopin on Saturday PM. Continue to not take Klonopin on Monday and Thursday PM.  Continue Klonopin 1 mg ( 1 tablet) AM daily.

## 2018-11-15 ENCOUNTER — Ambulatory Visit (INDEPENDENT_AMBULATORY_CARE_PROVIDER_SITE_OTHER): Payer: Medicare Other | Admitting: Licensed Clinical Social Worker

## 2018-11-15 ENCOUNTER — Ambulatory Visit (INDEPENDENT_AMBULATORY_CARE_PROVIDER_SITE_OTHER): Payer: Medicare Other | Admitting: Psychiatry

## 2018-11-15 ENCOUNTER — Encounter: Payer: Self-pay | Admitting: Licensed Clinical Social Worker

## 2018-11-15 ENCOUNTER — Encounter: Payer: Self-pay | Admitting: Psychiatry

## 2018-11-15 VITALS — BP 119/80 | HR 94 | Ht 67.25 in | Wt 252.0 lb

## 2018-11-15 DIAGNOSIS — F330 Major depressive disorder, recurrent, mild: Secondary | ICD-10-CM | POA: Diagnosis not present

## 2018-11-15 DIAGNOSIS — F410 Panic disorder [episodic paroxysmal anxiety] without agoraphobia: Secondary | ICD-10-CM

## 2018-11-15 DIAGNOSIS — F411 Generalized anxiety disorder: Secondary | ICD-10-CM

## 2018-11-15 NOTE — Patient Instructions (Addendum)
Start taking half of 1 mg on Tuesday AM.  Continue Skipping PM dose of Klonopin on Thursday, Saturday.  Continue 1 mg in the AM every other days. Continue 1 mg PM the other days.

## 2018-11-15 NOTE — Progress Notes (Signed)
   THERAPIST PROGRESS NOTE  Session Time: 1100  Participation Level: Active  Behavioral Response: CasualAlertAnxious  Type of Therapy: Individual Therapy  Treatment Goals addressed: Anxiety  Interventions: CBT  Summary: Steven [name] is a 38 y.o. male who presents with symptoms related to his diagnosis. Hisao reports his anxiety has been "up and down," especially over the holiday season. He reports he was not able to spend the night with his family on Christmas Eve, but was able to do so on New Year's Eve. He states this week he attempted to stay over one night, but ended up leaving at 1AM, "I just felt uncomfortable so I went home." We discussed his underlying thoughts associated with being at home, and Latoya was able to recognize that no matter where he is, he feels like he should be at the other place. For instance, if he is at his mother's, he feels he should be with his family and vice versa. We discussed that, as long as Binyamin does not have a firm commitment to either going home or staying at his mother's, he will continue to feel anxious and pulled in two directions. Billal reported feeling unsure if he wanted to return home--but wanted to be with his children. LCSW and Kennet worked to identify distorted thoughts that were supporting his anxiety about returning home, and allowing him to stay at his mother's home. Madeline was able to do this, and was tearful at times during our session. At one point, he asked if we could be done for the day. Gar was able to talk through his feelings, and stated, "that's just what I do. I want to leave." LCSW validated that feeling, but recognized that, although he wanted to--he did not leave the session and remained present.   Suicidal/Homicidal: No  Therapist Response: Jamaine reports an increase in his anxiety, and reports feeling pressure from his wife to return home. Daken was able to accept insight from LCSW, and was able to recognize his distorted  thoughts and how he could change his thinking around certain situations. We will continue to utilize CBT to assist Nicolis in managing his anxiety and panic.   Plan: Return again in 2 weeks.  Diagnosis: Axis I: Generalized Anxiety Disorder    Axis II: No diagnosis    Heidi Dach, LCSW 11/15/2018

## 2018-11-15 NOTE — Progress Notes (Signed)
BH MD OP Progress Note  11/15/2018 4:11 PM EPPIE PULE  MRN:  [id number]  Chief Complaint: ' I am here for follow up." Chief Complaint    Follow-up     HPI: Kylil is a 38 year old Caucasian male on SSD, lives in [place], has a history of anxiety, depression, tachycardia, asthma, COPD, pulmonary fibrosis, history of thyroid nodule, GERD, history of cardiomyopathy, presented to the clinic today for a follow-up visit.   Patient today reports his anxiety symptoms as improving.  He continues to be compliant on his Lexapro.  Patient reports therapy sessions is going well.  He reports sleep is good.  He continues to work on cutting down on [employer].  So far he has been able to skip it 1 day a week in the evening.  Discussed with patient about weaning it off more.  Provided medication education and long-term risk of being on benzodiazepine therapy.  Provided verbal and written instructions.  Patient agrees with plan.   Visit Diagnosis:    ICD-10-CM   1. GAD (generalized anxiety disorder) F41.1   2. Panic disorder F41.0   3. MDD (major depressive disorder), recurrent episode, mild (HCC) F33.0     Past Psychiatric History: Reviewed past psychiatric history from my progress note on 09/13/2018.  Past trials of BuSpar, Wellbutrin, Abilify, Klonopin, Lexapro, Effexor.    Past Medical History:  Past Medical History:  Diagnosis Date  . Anxiety   . Arthritis    knee  . Asthma   . Chronic mental illness   . COPD (chronic obstructive pulmonary disease) (HCC)   . Depression   . GERD (gastroesophageal reflux disease)   . Heart palpitations   . Panic attack   . Pulmonary fibrosis (HCC)     Past Surgical History:  Procedure Laterality Date  . ESOPHAGOGASTRODUODENOSCOPY (EGD) WITH PROPOFOL N/A 09/05/2017   Procedure: ESOPHAGOGASTRODUODENOSCOPY (EGD) WITH PROPOFOL;  Surgeon: Toledo, Boykin Nearing, MD;  Location: [facility] ENDOSCOPY;  Service: Gastroenterology;  Laterality: N/A;  . none    .  TUBES IN EARS      Family Psychiatric History: Reviewed family psychiatric history from my progress note on 09/13/2018.  Family History:  Family History  Problem Relation Age of Onset  . COPD Mother   . Asthma Mother   . Mental illness Mother   . COPD Father   . Pulmonary fibrosis Father   . Lung cancer Father     Social History: Reviewed social history from my progress note on 09/13/2018. Social History   Socioeconomic History  . Marital status: Married    Spouse name: heather  . Number of children: 4  . Years of education: Not on file  . Highest education level: Associate degree: occupational, Scientist, product/process development, or vocational program  Occupational History  . Not on file  Social Needs  . Financial resource strain: Not hard at all  . Food insecurity:    Worry: Never true    Inability: Never true  . Transportation needs:    Medical: No    Non-medical: No  Tobacco Use  . Smoking status: Former Smoker    Packs/day: 0.50    Years: 2.00    Pack years: 1.00    Types: Cigarettes    Last attempt to quit: 10/31/2000    Years since quitting: 18.0  . Smokeless tobacco: Current User    Types: Chew  . Tobacco comment: 2003  Substance and Sexual Activity  . Alcohol use: No    Alcohol/week: 0.0 standard drinks  .  Drug use: No  . Sexual activity: Yes  Lifestyle  . Physical activity:    Days per week: 0 days    Minutes per session: 0 min  . Stress: Rather much  Relationships  . Social connections:    Talks on phone: Not on file    Gets together: Not on file    Attends religious service: Never    Active member of club or organization: No    Attends meetings of clubs or organizations: Never    Relationship status: Married  Other Topics Concern  . Not on file  Social History Narrative  . Not on file    Allergies:  Allergies  Allergen Reactions  . Diphenhydramine Anaphylaxis  . Guaifenesin Swelling    Bodily Swelling  . Doxycycline     tachycardia  . Amoxicillin Nausea  Only    Other reaction(s): Other (See Comments) Increases anxiety Other reaction(s): Other (See Comments) Increases anxiety    Metabolic Disorder Labs: No results found for: HGBA1C, MPG No results found for: PROLACTIN No results found for: CHOL, TRIG, HDL, CHOLHDL, VLDL, LDLCALC Lab Results  Component Value Date   TSH 1.550 09/14/2018    Therapeutic Level Labs: No results found for: LITHIUM No results found for: VALPROATE No components found for:  CBMZ  Current Medications: Current Outpatient Medications  Medication Sig Dispense Refill  . acetaminophen (TYLENOL) 500 MG tablet Take 1,000 mg by mouth every 6 (six) hours as needed for moderate pain.    Marland Kitchen. albuterol (PROVENTIL) (2.5 MG/3ML) 0.083% nebulizer solution Inhale 3 mLs (2.5 mg total) into the lungs every 6 (six) hours as needed for wheezing or shortness of breath. 75 mL 4  . ATROVENT HFA 17 MCG/ACT inhaler Inhale 2 puffs into the lungs every 6 (six) hours as needed for wheezing. 1 Inhaler 0  . BREO ELLIPTA 100-25 MCG/INH AEPB Inhale 1 puff into the lungs daily. 1 each 3  . clonazePAM (KLONOPIN) 1 MG tablet Take 0.5-1 tablets (0.5-1 mg total) by mouth as directed. For anxiety 56 tablet 0  . COMBIVENT RESPIMAT 20-100 MCG/ACT AERS respimat Inhale 1 puff into the lungs every 6 (six) hours as needed for wheezing. 1 Inhaler 2  . EPIPEN 2-PAK 0.3 MG/0.3ML SOAJ injection 0.3 mg.     . escitalopram (LEXAPRO) 20 MG tablet Take 1 tablet (20 mg total) by mouth daily. 90 tablet 0  . fexofenadine (ALLEGRA) 180 MG tablet Take 180 mg by mouth daily as needed for allergies.     . fluticasone (FLONASE) 50 MCG/ACT nasal spray Place 1 spray into both nostrils daily.    . hydrOXYzine (ATARAX/VISTARIL) 25 MG tablet Take 1 tablet (25 mg total) by mouth as directed. Take half to one tablet daily as needed and 2 tablet at bedtime- anxiety,sleep 75 tablet 2  . omeprazole (PRILOSEC) 20 MG capsule Take 20 mg by mouth 2 (two) times daily before a meal.      . OXYGEN Inhale into the lungs. 2LITERS AT NIGHT AND WHEN SICK    . PROAIR HFA 108 (90 Base) MCG/ACT inhaler INAHLE 2 PUFFS BY MOUTH EVERY 4 TO 6 HOURS AS NEEDED 8.5 g 0   No current facility-administered medications for this visit.      Musculoskeletal: Strength & Muscle Tone: within normal limits Gait & Station: normal Patient leans: N/A  Psychiatric Specialty Exam: Review of Systems  Psychiatric/Behavioral: The patient is nervous/anxious (improving).   All other systems reviewed and are negative.   Blood pressure 119/80, pulse  94, height 5' 7.25" (1.708 m), weight 252 lb (114.3 kg).Body mass index is 39.18 kg/m.  General Appearance: Casual  Eye Contact:  Fair  Speech:  Clear and Coherent  Volume:  Normal  Mood:  Anxious  Affect:  Appropriate  Thought Process:  Goal Directed and Descriptions of Associations: Intact  Orientation:  Full (Time, Place, and Person)  Thought Content: Logical   Suicidal Thoughts:  No  Homicidal Thoughts:  No  Memory:  Immediate;   Fair Recent;   Fair Remote;   Fair  Judgement:  Fair  Insight:  Fair  Psychomotor Activity:  Normal  Concentration:  Concentration: Fair and Attention Span: Fair  Recall:  [employer] of Knowledge: Fair  Language: Fair  Akathisia:  No  Handed:  Right  AIMS (if indicated): denies tremors, rigidity  Assets:  Communication Skills Desire for Improvement Social Support  ADL's:  Intact  Cognition: WNL  Sleep:  Fair   Screenings: PHQ2-9     [facility] Visit from [facility]/10/[facility] in [facility] Office Visit from 01/18/2018 in [facility] OTHER RESP ORIENTATION from 02/09/2016 in [facility]  PHQ-2 Total Score  6  2  5   PHQ-9 Total Score  8  -  23       Assessment and Plan: Carol is a 38 year old Caucasian male who is married, on disability, lives in [place], is a history of depression, anxiety, pulmonary fibrosis, GERD, COPD, asthma, tachycardia,  presented to the clinic today for a follow-up visit.  Patient is biologically predisposed given his family history.  He also has psychosocial stressors of his mother's health problems.  Patient reports mood symptoms is improving.  Will continue plan as noted below.  Plan MDD-improving Lexapro 20 mg p.o. daily  For GAD-improving Discussed with patient to wean off Klonopin as instructed. Discussed with patient to take 1 mg daily in the a.m. except for Tuesday when he takes 0.5 mg and 1 mg every day p.m. except for Thursday and Saturday.  On Thursday and Saturday he will skip the dose.  He is skipping it already on Thursdays. Discussed with patient to take hydroxyzine as needed for anxiety symptoms.  For panic disorder-improving Continue CBT.  Follow-up in clinic in 2 weeks or sooner if needed.  I have spent atleast 15 minutes face to face with patient today. More than 50 % of the time was spent for psychoeducation and supportive psychotherapy and care coordination.  This note was generated in part or whole with voice recognition software. Voice recognition is usually quite accurate but there are transcription errors that can and very often do occur. I apologize for any typographical errors that were not detected and corrected.        Jomarie Longs, MD 11/15/2018, 4:11 PM

## 2018-11-27 ENCOUNTER — Encounter: Payer: Self-pay | Admitting: Licensed Clinical Social Worker

## 2018-11-27 ENCOUNTER — Ambulatory Visit (INDEPENDENT_AMBULATORY_CARE_PROVIDER_SITE_OTHER): Payer: Medicare Other | Admitting: Licensed Clinical Social Worker

## 2018-11-27 DIAGNOSIS — F410 Panic disorder [episodic paroxysmal anxiety] without agoraphobia: Secondary | ICD-10-CM

## 2018-11-27 DIAGNOSIS — F411 Generalized anxiety disorder: Secondary | ICD-10-CM | POA: Diagnosis not present

## 2018-11-27 NOTE — Progress Notes (Signed)
   THERAPIST PROGRESS NOTE  Session Time: 1100  Participation Level: Active  Behavioral Response: Casual and DisheveledAlertAnxious  Type of Therapy: Individual Therapy  Treatment Goals addressed: Anxiety  Interventions: CBT  Summary: Steven [name] is a 38 y.o. male who presents with symptoms related to his diagnosis. [name] arrived twenty minutes late for his scheduled appointment, and presented with a constricted affect. [name] reported he moved back into his family home nine days ago, and has stayed there every night since. He reports it has been, "different." He reports, "everything was going okay until yesterday. I had a really bad panic attack." [name] reported leading up to the panic attack, he was dragging a piece of heavy wood to a fire pit, and was already out of breath. He reports, during the panic attack, feeling like he was going to die. LCSW asked [name] to recall a time in his life when this feeling started. He identified the time he went into cardiac arrest and was on life support for two weeks, "they were going to pull my plug that day, and then I woke up." LCSW highlighted the connection between [name]'s panic attacks and that event. [name] reported not remembering that feeling during the event, so LCSW explained how our bodies can remember trauma that our minds do not. [name] expressed understanding and agreement. [name] also reported being nervous that his wife is scared he is going to leave again. LCSW encouraged [name] to communicate more freely with his wife, and reassure her that he plans to stay. Additionally, LCSW encouraged [name] to communicate with his wife about his anxiety and panic attacks, in order for her to be more aware of his emotional state. [name] expressed agreement with this idea as well.  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   Suicidal/Homicidal: No  Therapist Response: Chalres is able to express himself freely during each session, and is able to report changes in his emotional symptoms. [name] is able to utilize skills learned in previous sessions, and accept input from LCSW. We will continue to utilize CBT moving forward to assist [name] in managing his anxiety.   Plan: Return again in 2 weeks.  Diagnosis: Axis I: Generalized Anxiety Disorder    Axis II: No diagnosis    Heidi Dach, LCSW 11/27/2018

## 2018-11-29 ENCOUNTER — Ambulatory Visit: Payer: Medicare Other | Admitting: Psychiatry

## 2018-11-29 ENCOUNTER — Telehealth: Payer: Self-pay

## 2018-11-29 NOTE — Telephone Encounter (Signed)
pt was late for appt. pt states that he is having increase headaches and wonder if it is coming from the mediations he takin

## 2018-11-29 NOTE — Telephone Encounter (Signed)
Returned call to patient. Patient was late for appointment and could not be seen. Pt reports a mild head ache on and off, discussed taking Tylemol. He states he is already taking it and its nothing major. He wants to stay on current Klonopin dosage for now. Denies other withdrawal sx. Pt has appointment to see me back on Tuesday.

## 2018-12-04 ENCOUNTER — Other Ambulatory Visit: Payer: Self-pay

## 2018-12-04 ENCOUNTER — Encounter: Payer: Self-pay | Admitting: Psychiatry

## 2018-12-04 ENCOUNTER — Ambulatory Visit (INDEPENDENT_AMBULATORY_CARE_PROVIDER_SITE_OTHER): Payer: Medicare Other | Admitting: Psychiatry

## 2018-12-04 VITALS — BP 130/87 | HR 84 | Temp 97.7°F | Wt 255.4 lb

## 2018-12-04 DIAGNOSIS — F410 Panic disorder [episodic paroxysmal anxiety] without agoraphobia: Secondary | ICD-10-CM | POA: Diagnosis not present

## 2018-12-04 DIAGNOSIS — F411 Generalized anxiety disorder: Secondary | ICD-10-CM | POA: Diagnosis not present

## 2018-12-04 MED ORDER — CLONAZEPAM 1 MG PO TABS: 0.5000 mg | ORAL_TABLET | ORAL | 0 refills | Status: DC

## 2018-12-04 NOTE — Progress Notes (Signed)
BH MD OP Progress Note  12/04/2018 3:49 PM Steven [name] Steven [name]  MRN:  [id number]  Chief Complaint: ' I am here for follow up.' Chief Complaint    Follow-up     HPI: Steven HeckRandy is a 38 year old Caucasian male, on SSD, lives in [place], history of anxiety, depression, tachycardia, asthma, COPD, pulmonary fibrosis, history of thyroid nodule, GERD, cardiomyopathy, presented to clinic today for a follow-up visit.  Patient today reports he is tolerating the Klonopin taper well.  He reports he currently takes only half a tablet in the morning on Tuesdays and skips the dosages on Thursday and Saturday p.m.  Patient however reports there has been some changes going on in his life.  His mother got admitted to the hospital and it is likely that they are going to send her to a nursing home when she is discharged.  She struggles with Parkinson's disease.  Patient reports he has to get used to it since he used to go stay with his mother often and he cannot imagine that she is not going to be home anymore.  Discussed with patient to talk to his therapist about his current stressors.  He has upcoming appointment with Steven MingsKelsey.  Patient denies any suicidality.  Patient denies any perceptual disturbances.  Patient denies any other concerns today. Visit Diagnosis:    ICD-10-CM   1. GAD (generalized anxiety disorder) F41.1   2. Panic disorder F41.0 clonazePAM (KLONOPIN) 1 MG tablet    Past Psychiatric History: I have reviewed past psychiatric history from my progress note on 09/13/2018.  Past trials of BuSpar, Wellbutrin, Abilify, Klonopin, Lexapro, Effexor.  Past Medical History:  Past Medical History:  Diagnosis Date  . Anxiety   . Arthritis    knee  . Asthma   . Chronic mental illness   . COPD (chronic obstructive pulmonary disease) (HCC)   . Depression   . GERD (gastroesophageal reflux disease)   . Heart palpitations   . Panic attack   . Pulmonary fibrosis (HCC)     Past Surgical History:  Procedure  Laterality Date  . ESOPHAGOGASTRODUODENOSCOPY (EGD) WITH PROPOFOL N/A 09/05/2017   Procedure: ESOPHAGOGASTRODUODENOSCOPY (EGD) WITH PROPOFOL;  Surgeon: Toledo, Boykin Nearingeodoro K, MD;  Location: [facility] ENDOSCOPY;  Service: Gastroenterology;  Laterality: N/A;  . none    . TUBES IN EARS      Family Psychiatric History: I have reviewed family psychiatric history from my progress note on 09/13/2018.  Family History:  Family History  Problem Relation Age of Onset  . COPD Mother   . Asthma Mother   . Mental illness Mother   . COPD Father   . Pulmonary fibrosis Father   . Lung cancer Father     Social History: Reviewed social history from my progress note on 09/13/2018. Social History   Socioeconomic History  . Marital status: Married    Spouse name: heather  . Number of children: 4  . Years of education: Not on file  . Highest education level: Associate degree: occupational, Scientist, product/process developmenttechnical, or vocational program  Occupational History  . Not on file  Social Needs  . Financial resource strain: Not hard at all  . Food insecurity:    Worry: Never true    Inability: Never true  . Transportation needs:    Medical: No    Non-medical: No  Tobacco Use  . Smoking status: Former Smoker    Packs/day: 0.50    Years: 2.00    Pack years: 1.00    Types: Cigarettes  Last attempt to quit: 10/31/2000    Years since quitting: 18.1  . Smokeless tobacco: Current User    Types: Chew  . Tobacco comment: 2003  Substance and Sexual Activity  . Alcohol use: No    Alcohol/week: 0.0 standard drinks  . Drug use: No  . Sexual activity: Yes  Lifestyle  . Physical activity:    Days per week: 0 days    Minutes per session: 0 min  . Stress: Rather much  Relationships  . Social connections:    Talks on phone: Not on file    Gets together: Not on file    Attends religious service: Never    Active member of club or organization: No    Attends meetings of clubs or organizations: Never    Relationship status:  Married  Other Topics Concern  . Not on file  Social History Narrative  . Not on file    Allergies:  Allergies  Allergen Reactions  . Diphenhydramine Anaphylaxis  . Guaifenesin Swelling    Bodily Swelling  . Doxycycline     tachycardia  . Amoxicillin Nausea Only    Other reaction(s): Other (See Comments) Increases anxiety Other reaction(s): Other (See Comments) Increases anxiety    Metabolic Disorder Labs: No results found for: HGBA1C, MPG No results found for: PROLACTIN No results found for: CHOL, TRIG, HDL, CHOLHDL, VLDL, LDLCALC Lab Results  Component Value Date   TSH 1.550 09/14/2018    Therapeutic Level Labs: No results found for: LITHIUM No results found for: VALPROATE No components found for:  CBMZ  Current Medications: Current Outpatient Medications  Medication Sig Dispense Refill  . acetaminophen (TYLENOL) 500 MG tablet Take 1,000 mg by mouth every 6 (six) hours as needed for moderate pain.    Marland Kitchen albuterol (PROVENTIL) (2.5 MG/3ML) 0.083% nebulizer solution Inhale 3 mLs (2.5 mg total) into the lungs every 6 (six) hours as needed for wheezing or shortness of breath. 75 mL 4  . ATROVENT HFA 17 MCG/ACT inhaler Inhale 2 puffs into the lungs every 6 (six) hours as needed for wheezing. 1 Inhaler 0  . BREO ELLIPTA 100-25 MCG/INH AEPB Inhale 1 puff into the lungs daily. 1 each 3  . clonazePAM (KLONOPIN) 1 MG tablet Take 0.5-1 tablets (0.5-1 mg total) by mouth as directed. Twice a day as needed( as discussed) 54 tablet 0  . COMBIVENT RESPIMAT 20-100 MCG/ACT AERS respimat Inhale 1 puff into the lungs every 6 (six) hours as needed for wheezing. 1 Inhaler 2  . EPIPEN 2-PAK 0.3 MG/0.3ML SOAJ injection 0.3 mg.     . escitalopram (LEXAPRO) 20 MG tablet Take 1 tablet (20 mg total) by mouth daily. 90 tablet 0  . fexofenadine (ALLEGRA) 180 MG tablet Take 180 mg by mouth daily as needed for allergies.     . fluticasone (FLONASE) 50 MCG/ACT nasal spray Place 1 spray into both  nostrils daily.    . hydrOXYzine (ATARAX/VISTARIL) 25 MG tablet Take 1 tablet (25 mg total) by mouth as directed. Take half to one tablet daily as needed and 2 tablet at bedtime- anxiety,sleep 75 tablet 2  . omeprazole (PRILOSEC) 20 MG capsule Take 20 mg by mouth 2 (two) times daily before a meal.     . OXYGEN Inhale into the lungs. 2LITERS AT NIGHT AND WHEN SICK    . PROAIR HFA 108 (90 Base) MCG/ACT inhaler INAHLE 2 PUFFS BY MOUTH EVERY 4 TO 6 HOURS AS NEEDED 8.5 g 0   No current facility-administered medications  for this visit.      Musculoskeletal: Strength & Muscle Tone: within normal limits Gait & Station: normal Patient leans: N/A  Psychiatric Specialty Exam: Review of Systems  Psychiatric/Behavioral: The patient is nervous/anxious.   All other systems reviewed and are negative.   Blood pressure 130/87, pulse 84, temperature 97.7 F (36.5 C), temperature source Oral, weight 255 lb 6.4 oz (115.8 kg).Body mass index is 39.7 kg/m.  General Appearance: Casual  Eye Contact:  Fair  Speech:  Clear and Coherent  Volume:  Normal  Mood:  Anxious  Affect:  Congruent  Thought Process:  Goal Directed and Descriptions of Associations: Intact  Orientation:  Full (Time, Place, and Person)  Thought Content: Logical   Suicidal Thoughts:  No  Homicidal Thoughts:  No  Memory:  Immediate;   Fair Recent;   Fair Remote;   Fair  Judgement:  Fair  Insight:  Fair  Psychomotor Activity:  Normal  Concentration:  Concentration: Fair and Attention Span: Fair  Recall:  [employer] of Knowledge: Fair  Language: Fair  Akathisia:  No  Handed:  Right  AIMS (if indicated): denies tremors, rigidity,stiffness  Assets:  Communication Skills Desire for Improvement Social Support  ADL's:  Intact  Cognition: WNL  Sleep:  Fair   Screenings: PHQ2-9     Office Visit from 07/10/2018 in [facility], [facility] Office Visit from 01/18/2018 in [facility], [facility] PULMONARY REHAB OTHER RESP  ORIENTATION from 02/09/2016 in [facility]  PHQ-2 Total Score  6  2  5   PHQ-9 Total Score  8  -  23       Assessment and Plan: Qwinton is a 38 year old Caucasian male who is married, on disability, lives in [place], has a history of depression, pulmonary fibrosis, GERD, COPD, asthma, tachycardia, presented to the clinic today for a follow-up visit.  Patient is biologically predisposed given his family history.  He also has psychosocial stressors of his mother's health problems which are currently worsening.  Patient will benefit from continued psychotherapy sessions.  He is tolerating Klonopin taper well and will continue to do the same.  Plan MDD- improving Lexapro 20 mg p.o. daily  For GAD- improving Continue Lexapro as prescribed Hydroxyzine 25 mg as needed daily. Continue to wean off Klonopin.  Discussed with patient to start taking Klonopin half tablet on Wednesday a.m. this week, next week he will add Monday when he takes only half tablet and on week 3 he will start taking half tablet of Klonopin in the a.m. on Fridays. He will continue to skip Thursday and Saturday p.m. dosages.  He will return to clinic in 3 weeks from now.  Follow-up Ms. Heidi Dach as scheduled.  I have spent atleast 15 minutes face to face with patient today. More than 50 % of the time was spent for psychoeducation and supportive psychotherapy and care coordination.  This note was generated in part or whole with voice recognition software. Voice recognition is usually quite accurate but there are transcription errors that can and very often do occur. I apologize for any typographical errors that were not detected and corrected.       Jomarie Longs, MD 12/04/2018, 3:49 PM

## 2018-12-04 NOTE — Patient Instructions (Signed)
Start taking only half tablet of klonopin on wednesdays AM - this week Continue your current dosage of klonopin every day of the week for this week.  Next week - week 2  Start taking half tablet of klonopin on Monday AM. That means half tablet on Monday, Tuesday and Wednesday AM .  Week 3 Start taking half tablet on Friday AM So you will take half tablet on Monday, Tuesday, Wednesday and Friday AM  Thursday and Saturday PM - no Klonopin ( that's what you are doing now).  Please return in  3 weeks.

## 2018-12-11 ENCOUNTER — Ambulatory Visit: Payer: Medicare Other | Admitting: Licensed Clinical Social Worker

## 2018-12-25 ENCOUNTER — Other Ambulatory Visit: Payer: Self-pay

## 2018-12-25 ENCOUNTER — Encounter: Payer: Self-pay | Admitting: Psychiatry

## 2018-12-25 ENCOUNTER — Ambulatory Visit (INDEPENDENT_AMBULATORY_CARE_PROVIDER_SITE_OTHER): Payer: Medicare Other | Admitting: Psychiatry

## 2018-12-25 VITALS — BP 129/84 | HR 96 | Temp 97.8°F | Wt 260.4 lb

## 2018-12-25 DIAGNOSIS — F410 Panic disorder [episodic paroxysmal anxiety] without agoraphobia: Secondary | ICD-10-CM | POA: Diagnosis not present

## 2018-12-25 DIAGNOSIS — F330 Major depressive disorder, recurrent, mild: Secondary | ICD-10-CM

## 2018-12-25 DIAGNOSIS — F411 Generalized anxiety disorder: Secondary | ICD-10-CM

## 2018-12-25 MED ORDER — QUETIAPINE FUMARATE 25 MG PO TABS: 12.5000 mg | ORAL_TABLET | Freq: Every day | ORAL | 1 refills | Status: DC | PRN

## 2018-12-25 NOTE — Patient Instructions (Addendum)
Start taking only half tablet of klonopin on friday AM - this week Continue your current dosage of klonopin every day of the week for this week.  Next week - week 2  Start taking half tablet of klonopin on Saturday AM. That means half tablet on Monday, Tuesday and Wednesday AM , Friday,saturday AM  Week 3 Start taking half tablet on thursday AM So you will take half tablet on Monday, Tuesday, Wednesday and Thursday AM,Friday AM,saturday  Thursday and Saturday PM - no Klonopin ( that's what you are doing now).  Please return in  3 weeks.

## 2018-12-25 NOTE — Progress Notes (Signed)
BH MD OP Progress Note  12/25/2018 4:50 PM Steven [name]  MRN:  [id number]  Chief Complaint: ' I am here for follow up." Chief Complaint    Follow-up; Medication Refill     HPI: Steven [name] is a 38 yr old Caucasian male on SSD, lives in [place], has a history of anxiety, depression, tachycardia, asthma, COPD, pulmonary fibrosis, history of thyroid nodule, GERD, cardiomyopathy, presented to clinic today for a follow-up visit.  Patient today reports he is currently under a lot of stress due to his mother's health problems.  She was admitted to a rehab facility and currently is back at home.  Patient reports patient and his siblings have been trying to stay with her to help her do her activities of daily living.  Patient reports she wants to go to a nursing home however wants to stay in [facility] and hence he is not sure how long they have to wait for the same.  Patient reports he went into a panic mode at least twice in the last 3 weeks.  He reports it is due to his mother's health issues and also his car broke down which triggered it.  He does not want to take the hydroxyzine since he had a bad reaction to Benadryl and he is always afraid to start new medication.  Patient reports sleep is good.  Patient continues to taper off of the Klonopin.  He reports he did not follow all the instructions given to him last visit.  He has not started skipping it on Fridays but will do it this week.  Provided him more instruction to taper off Klonopin . Visit Diagnosis:    ICD-10-CM   1. GAD (generalized anxiety disorder) F41.1 QUEtiapine (SEROQUEL) 25 MG tablet  2. Panic disorder F41.0 QUEtiapine (SEROQUEL) 25 MG tablet  3. MDD (major depressive disorder), recurrent episode, mild (HCC) F33.0     Past Psychiatric History: I have reviewed past psychiatric history from my progress note on 09/13/2018.  Past trials of BuSpar, Wellbutrin, Abilify, Klonopin, Lexapro, Effexor  Past Medical History:  Past  Medical History:  Diagnosis Date  . Anxiety   . Arthritis    knee  . Asthma   . Chronic mental illness   . COPD (chronic obstructive pulmonary disease) (HCC)   . Depression   . GERD (gastroesophageal reflux disease)   . Heart palpitations   . Panic attack   . Pulmonary fibrosis (HCC)     Past Surgical History:  Procedure Laterality Date  . ESOPHAGOGASTRODUODENOSCOPY (EGD) WITH PROPOFOL N/A 09/05/2017   Procedure: ESOPHAGOGASTRODUODENOSCOPY (EGD) WITH PROPOFOL;  Surgeon: Toledo, Boykin Nearing, MD;  Location: [facility] ENDOSCOPY;  Service: Gastroenterology;  Laterality: N/A;  . none    . TUBES IN EARS      Family Psychiatric History: Reviewed family psychiatric history from my progress note on 09/13/2018.  Family History:  Family History  Problem Relation Age of Onset  . COPD Mother   . Asthma Mother   . Mental illness Mother   . COPD Father   . Pulmonary fibrosis Father   . Lung cancer Father     Social History: Reviewed social history from my progress note on 09/13/2018. Social History   Socioeconomic History  . Marital status: Married    Spouse name: heather  . Number of children: 4  . Years of education: Not on file  . Highest education level: Associate degree: occupational, Scientist, product/process development, or vocational program  Occupational History  . Not on file  Social Needs  . Financial resource strain: Not hard at all  . Food insecurity:    Worry: Never true    Inability: Never true  . Transportation needs:    Medical: No    Non-medical: No  Tobacco Use  . Smoking status: Former Smoker    Packs/day: 0.50    Years: 2.00    Pack years: 1.00    Types: Cigarettes    Last attempt to quit: 10/31/2000    Years since quitting: 18.1  . Smokeless tobacco: Current User    Types: Chew  . Tobacco comment: 2003  Substance and Sexual Activity  . Alcohol use: No    Alcohol/week: 0.0 standard drinks  . Drug use: No  . Sexual activity: Yes  Lifestyle  . Physical activity:    Days per  week: 0 days    Minutes per session: 0 min  . Stress: Rather much  Relationships  . Social connections:    Talks on phone: Not on file    Gets together: Not on file    Attends religious service: Never    Active member of club or organization: No    Attends meetings of clubs or organizations: Never    Relationship status: Married  Other Topics Concern  . Not on file  Social History Narrative  . Not on file    Allergies:  Allergies  Allergen Reactions  . Diphenhydramine Anaphylaxis  . Guaifenesin Swelling    Bodily Swelling  . Doxycycline     tachycardia  . Amoxicillin Nausea Only    Other reaction(s): Other (See Comments) Increases anxiety Other reaction(s): Other (See Comments) Increases anxiety    Metabolic Disorder Labs: No results found for: HGBA1C, MPG No results found for: PROLACTIN No results found for: CHOL, TRIG, HDL, CHOLHDL, VLDL, LDLCALC Lab Results  Component Value Date   TSH 1.550 09/14/2018    Therapeutic Level Labs: No results found for: LITHIUM No results found for: VALPROATE No components found for:  CBMZ  Current Medications: Current Outpatient Medications  Medication Sig Dispense Refill  . acetaminophen (TYLENOL) 500 MG tablet Take 1,000 mg by mouth every 6 (six) hours as needed for moderate pain.    Marland Kitchen. albuterol (PROVENTIL) (2.5 MG/3ML) 0.083% nebulizer solution Inhale 3 mLs (2.5 mg total) into the lungs every 6 (six) hours as needed for wheezing or shortness of breath. 75 mL 4  . ATROVENT HFA 17 MCG/ACT inhaler Inhale 2 puffs into the lungs every 6 (six) hours as needed for wheezing. 1 Inhaler 0  . BREO ELLIPTA 100-25 MCG/INH AEPB Inhale 1 puff into the lungs daily. 1 each 3  . clonazePAM (KLONOPIN) 1 MG tablet Take 0.5-1 tablets (0.5-1 mg total) by mouth as directed. Twice a day as needed( as discussed) 54 tablet 0  . COMBIVENT RESPIMAT 20-100 MCG/ACT AERS respimat Inhale 1 puff into the lungs every 6 (six) hours as needed for wheezing. 1  Inhaler 2  . EPIPEN 2-PAK 0.3 MG/0.3ML SOAJ injection 0.3 mg.     . escitalopram (LEXAPRO) 20 MG tablet Take 1 tablet (20 mg total) by mouth daily. 90 tablet 0  . fexofenadine (ALLEGRA) 180 MG tablet Take 180 mg by mouth daily as needed for allergies.     . fluticasone (FLONASE) 50 MCG/ACT nasal spray Place 1 spray into both nostrils daily.    Marland Kitchen. omeprazole (PRILOSEC) 20 MG capsule Take 20 mg by mouth 2 (two) times daily before a meal.     . OXYGEN Inhale into  the lungs. 2LITERS AT NIGHT AND WHEN SICK    . PROAIR HFA 108 (90 Base) MCG/ACT inhaler INAHLE 2 PUFFS BY MOUTH EVERY 4 TO 6 HOURS AS NEEDED 8.5 g 0  . QUEtiapine (SEROQUEL) 25 MG tablet Take 0.5-1 tablets (12.5-25 mg total) by mouth daily as needed. For severe agitation,anxiety 30 tablet 1   No current facility-administered medications for this visit.      Musculoskeletal: Strength & Muscle Tone: within normal limits Gait & Station: normal Patient leans: N/A  Psychiatric Specialty Exam: Review of Systems  Psychiatric/Behavioral: The patient is nervous/anxious.   All other systems reviewed and are negative.   Blood pressure 129/84, pulse 96, temperature 97.8 F (36.6 C), temperature source Oral, weight 260 lb 6.4 oz (118.1 kg).Body mass index is 40.48 kg/m.  General Appearance: Casual  Eye Contact:  Fair  Speech:  Clear and Coherent  Volume:  Normal  Mood:  Anxious  Affect:  Congruent  Thought Process:  Goal Directed and Descriptions of Associations: Intact  Orientation:  Full (Time, Place, and Person)  Thought Content: Logical   Suicidal Thoughts:  No  Homicidal Thoughts:  No  Memory:  Immediate;   Fair Recent;   Fair Remote;   Fair  Judgement:  Fair  Insight:  Fair  Psychomotor Activity:  Normal  Concentration:  Concentration: Fair and Attention Span: Fair  Recall:  [employer] of Knowledge: Fair  Language: Fair  Akathisia:  No  Handed:  Right  AIMS (if indicated): denies tremors, rigidity,stiffness  Assets:   Communication Skills Desire for Improvement Social Support  ADL's:  Intact  Cognition: WNL  Sleep:  Fair   Screenings: PHQ2-9     Office Visit from 07/10/2018 in [facility], [facility] Office Visit from 01/18/2018 in [facility], [facility] PULMONARY REHAB OTHER RESP ORIENTATION from 02/09/2016 in [facility]  PHQ-2 Total Score  6  2  5   PHQ-9 Total Score  8  -  23       Assessment and Plan: Justice is a 38 yr old male who is married, on disability, lives in [place], has a history of depression, pulmonary fibrosis, GERD, COPD, asthma, tachycardia, presented to clinic today for a follow-up visit.  Patient is biologically predisposed given his family history.  He also has psychosocial stressors of his mother's health problems.  Patient will continue to benefit from psychotherapy sessions.  We will continue to taper of Klonopin.  Plan MDD-improving Lexapro 20 mg p.o. daily  For GAD- improving Continue Lexapro. Continue CBT.  For panic attacks- worsening on and off We will start Seroquel 12.5 to 25 mg p.o. as needed for anxiety attacks. Patient with history of anaphylactic reaction to Benadryl, hence hydroxyzine may not be a good choice. Patient also with asthma and hence propranolol may be not beneficial.  Follow-up in clinic in 3 weeks or sooner if needed.  Patient provided with written instructions to wean off Klonopin more.  He will start taking a half dose on Friday a.m. this week, Saturday a.m. next week and Thursday a.m. on third week.  When he returns he will be taking half dose of Klonopin every day in the a.m.   I have spent atleast 15 minutes face to face with patient today. More than 50 % of the time was spent for psychoeducation and supportive psychotherapy and care coordination.  This note was generated in part or whole with voice recognition software. Voice recognition is usually quite accurate but there  are transcription errors that can and  very often do occur. I apologize for any typographical errors that were not detected and corrected.          Jomarie Longs, MD 12/25/2018, 4:50 PM

## 2018-12-26 ENCOUNTER — Encounter: Payer: Self-pay | Admitting: Licensed Clinical Social Worker

## 2018-12-26 ENCOUNTER — Ambulatory Visit (INDEPENDENT_AMBULATORY_CARE_PROVIDER_SITE_OTHER): Payer: Medicare Other | Admitting: Licensed Clinical Social Worker

## 2018-12-26 DIAGNOSIS — F411 Generalized anxiety disorder: Secondary | ICD-10-CM

## 2018-12-26 NOTE — Progress Notes (Signed)
   THERAPIST PROGRESS NOTE  Session Time: 1230-1302  Participation Level: Minimal  Behavioral Response: Fairly GroomedAlertNA  Type of Therapy: Individual Therapy  Treatment Goals addressed: Anxiety  Interventions: CBT  Summary: Steven [name] is a 38 y.o. male who presents with continued symptoms of his diagnosis. Steven [name] reports things have been "really hectic," since our last session. He reports his mother went to the hospital and subsequently went into a rehab facility. While there, she was non-compliant with her physical therapy, so they discharged her home early--which has put a significant burden on family members. Steven [name] reports family members are with her at all times to ensure she is taken care of, though they would like to transition her into a nursing facility. At this time, there are waiting lists at all facilities. Steven [name] reported that, despite that situation, he has been handling his anxiety well. He reports having two panic attacks in the same day--both were brought on by shortness of breath. Steven [name] reported in the moment, he was challenging his negative thoughts and utilized the mantra, "this is just a feeling. This isn't real." LCSW validated Steven [name]'s ability to manage his anxiety in the moment, and his use of CBT skills. Steven [name] reported ongoing issues with maintaining appropriate boundaries with his oldest son's girlfriend (age 21). LCSW encouraged Steven [name] to be firm in setting physical boundaries, and asserting when behavior is not appropriate. Steven [name] reported he "didn't want to be mean," however was able to recognize the importance of setting boundaries for his marriage. LCSW asked Steven [name] to view the situation from his wife's perspective, which he was able to do.   Suicidal/Homicidal: No  Therapist Response: Steven [name] continues to work towards his tx goals but has not yet reached them. He is better able to manage his anxiety in the moment and is able to talk himself through panic attacks.  However, he is still having regular panic attacks at this time. We will continue to utilize CBT moving forward to continue addressing anxiety symptoms.   Plan: Return again in 2 weeks.  Diagnosis: Axis I: Generalized Anxiety Disorder    Axis II: No diagnosis    Heidi Dach, LCSW 12/26/2018

## 2019-01-15 ENCOUNTER — Ambulatory Visit (INDEPENDENT_AMBULATORY_CARE_PROVIDER_SITE_OTHER): Payer: Medicare Other | Admitting: Psychiatry

## 2019-01-15 ENCOUNTER — Encounter: Payer: Self-pay | Admitting: Psychiatry

## 2019-01-15 ENCOUNTER — Other Ambulatory Visit: Payer: Self-pay

## 2019-01-15 VITALS — BP 135/85 | HR 86 | Temp 97.8°F | Wt 262.2 lb

## 2019-01-15 DIAGNOSIS — F330 Major depressive disorder, recurrent, mild: Secondary | ICD-10-CM

## 2019-01-15 DIAGNOSIS — F411 Generalized anxiety disorder: Secondary | ICD-10-CM

## 2019-01-15 DIAGNOSIS — F410 Panic disorder [episodic paroxysmal anxiety] without agoraphobia: Secondary | ICD-10-CM

## 2019-01-15 NOTE — Progress Notes (Signed)
BH MD OP Progress Note  01/15/2019 5:44 PM [facility]  MRN:  [id number]  Chief Complaint: ' I am here for follow-up.' Chief Complaint    Follow-up; Medication Refill     HPI: Steven [name] is a 38 year old Caucasian male, on SSD, lives in [place], has a history of anxiety, depression, tachycardia, asthma, COPD, pulmonary fibrosis, history of thyroid nodules, GERD, cardiomyopathy, presented to clinic today for a follow-up visit.  Patient today reports he continues to wean himself off of the Klonopin.  He has noticed an occasional headache as well as some mild irritability on and off.  He however has been able to cope with it.  He continues to have some panic attacks on and off.  He reports he tried the Seroquel low-dose and he did not notice any benefit from the same.  Patient reports otherwise he is doing okay.  He continues to be in therapy sessions with Ms. Heidi Dach which is going well.  Patient denies any other concerns today. Visit Diagnosis:    ICD-10-CM   1. GAD (generalized anxiety disorder) F41.1   2. Panic disorder F41.0   3. MDD (major depressive disorder), recurrent episode, mild (HCC) F33.0     Past Psychiatric History: I have reviewed past psychiatric history from my progress note on 09/13/2018.  Past trials of BuSpar, Wellbutrin, Abilify, Klonopin, Lexapro, Effexor.  Past Medical History:  Past Medical History:  Diagnosis Date  . Anxiety   . Arthritis    knee  . Asthma   . Chronic mental illness   . COPD (chronic obstructive pulmonary disease) (HCC)   . Depression   . GERD (gastroesophageal reflux disease)   . Heart palpitations   . Panic attack   . Pulmonary fibrosis (HCC)     Past Surgical History:  Procedure Laterality Date  . ESOPHAGOGASTRODUODENOSCOPY (EGD) WITH PROPOFOL N/A 09/05/2017   Procedure: ESOPHAGOGASTRODUODENOSCOPY (EGD) WITH PROPOFOL;  Surgeon: Toledo, Boykin Nearing, MD;  Location: [facility] ENDOSCOPY;  Service: Gastroenterology;  Laterality: N/A;   . none    . TUBES IN EARS      Family Psychiatric History: I have reviewed family psychiatric history from my progress note on 09/13/2018.  Family History:  Family History  Problem Relation Age of Onset  . COPD Mother   . Asthma Mother   . Mental illness Mother   . COPD Father   . Pulmonary fibrosis Father   . Lung cancer Father     Social History: Have reviewed social history from my progress note on 09/13/2018. Social History   Socioeconomic History  . Marital status: Married    Spouse name: heather  . Number of children: 4  . Years of education: Not on file  . Highest education level: Associate degree: occupational, Scientist, product/process development, or vocational program  Occupational History  . Not on file  Social Needs  . Financial resource strain: Not hard at all  . Food insecurity:    Worry: Never true    Inability: Never true  . Transportation needs:    Medical: No    Non-medical: No  Tobacco Use  . Smoking status: Former Smoker    Packs/day: 0.50    Years: 2.00    Pack years: 1.00    Types: Cigarettes    Last attempt to quit: 10/31/2000    Years since quitting: 18.2  . Smokeless tobacco: Current User    Types: Chew  . Tobacco comment: 2003  Substance and Sexual Activity  . Alcohol use: No  Alcohol/week: 0.0 standard drinks  . Drug use: No  . Sexual activity: Yes  Lifestyle  . Physical activity:    Days per week: 0 days    Minutes per session: 0 min  . Stress: Rather much  Relationships  . Social connections:    Talks on phone: Not on file    Gets together: Not on file    Attends religious service: Never    Active member of club or organization: No    Attends meetings of clubs or organizations: Never    Relationship status: Married  Other Topics Concern  . Not on file  Social History Narrative  . Not on file    Allergies:  Allergies  Allergen Reactions  . Diphenhydramine Anaphylaxis  . Guaifenesin Swelling    Bodily Swelling  . Doxycycline      tachycardia  . Amoxicillin Nausea Only    Other reaction(s): Other (See Comments) Increases anxiety Other reaction(s): Other (See Comments) Increases anxiety    Metabolic Disorder Labs: No results found for: HGBA1C, MPG No results found for: PROLACTIN No results found for: CHOL, TRIG, HDL, CHOLHDL, VLDL, LDLCALC Lab Results  Component Value Date   TSH 1.550 09/14/2018    Therapeutic Level Labs: No results found for: LITHIUM No results found for: VALPROATE No components found for:  CBMZ  Current Medications: Current Outpatient Medications  Medication Sig Dispense Refill  . acetaminophen (TYLENOL) 500 MG tablet Take 1,000 mg by mouth every 6 (six) hours as needed for moderate pain.    Marland Kitchen albuterol (PROVENTIL) (2.5 MG/3ML) 0.083% nebulizer solution Inhale 3 mLs (2.5 mg total) into the lungs every 6 (six) hours as needed for wheezing or shortness of breath. 75 mL 4  . ATROVENT HFA 17 MCG/ACT inhaler Inhale 2 puffs into the lungs every 6 (six) hours as needed for wheezing. 1 Inhaler 0  . BREO ELLIPTA 100-25 MCG/INH AEPB Inhale 1 puff into the lungs daily. 1 each 3  . clonazePAM (KLONOPIN) 1 MG tablet Take 0.5-1 tablets (0.5-1 mg total) by mouth as directed. Twice a day as needed( as discussed) 54 tablet 0  . COMBIVENT RESPIMAT 20-100 MCG/ACT AERS respimat Inhale 1 puff into the lungs every 6 (six) hours as needed for wheezing. 1 Inhaler 2  . EPIPEN 2-PAK 0.3 MG/0.3ML SOAJ injection 0.3 mg.     . escitalopram (LEXAPRO) 20 MG tablet Take 1 tablet (20 mg total) by mouth daily. 90 tablet 0  . fexofenadine (ALLEGRA) 180 MG tablet Take 180 mg by mouth daily as needed for allergies.     . fluticasone (FLONASE) 50 MCG/ACT nasal spray Place 1 spray into both nostrils daily.    Marland Kitchen omeprazole (PRILOSEC) 20 MG capsule Take 20 mg by mouth 2 (two) times daily before a meal.     . OXYGEN Inhale into the lungs. 2LITERS AT NIGHT AND WHEN SICK    . PROAIR HFA 108 (90 Base) MCG/ACT inhaler INAHLE 2 PUFFS  BY MOUTH EVERY 4 TO 6 HOURS AS NEEDED 8.5 g 0  . QUEtiapine (SEROQUEL) 25 MG tablet Take 0.5-1 tablets (12.5-25 mg total) by mouth daily as needed. For severe agitation,anxiety 30 tablet 1   No current facility-administered medications for this visit.      Musculoskeletal: Strength & Muscle Tone: within normal limits Gait & Station: normal Patient leans: N/A  Psychiatric Specialty Exam: Review of Systems  Psychiatric/Behavioral: The patient is nervous/anxious (Improving).   All other systems reviewed and are negative.   Blood pressure 135/85,  pulse 86, temperature 97.8 F (36.6 C), temperature source Oral, weight 262 lb 3.2 oz (118.9 kg).Body mass index is 40.76 kg/m.  General Appearance: Casual  Eye Contact:  Fair  Speech:  Clear and Coherent  Volume:  Normal  Mood:  Anxious  Affect:  Congruent  Thought Process:  Goal Directed and Descriptions of Associations: Intact  Orientation:  Full (Time, Place, and Person)  Thought Content: Logical   Suicidal Thoughts:  No  Homicidal Thoughts:  No  Memory:  Immediate;   Fair Recent;   Fair Remote;   Fair  Judgement:  Fair  Insight:  Fair  Psychomotor Activity:  Normal  Concentration:  Concentration: Fair and Attention Span: Fair  Recall:  [employer] of Knowledge: Fair  Language: Fair  Akathisia:  No  Handed:  Right  AIMS (if indicated): Denies tremors, rigidity  Assets:  Communication Skills Desire for Improvement Social Support  ADL's:  Intact  Cognition: WNL  Sleep:  Fair   Screenings: PHQ2-9     Office Visit from 07/10/2018 in [facility], [facility] Office Visit from 01/18/2018 in [facility], [facility] PULMONARY REHAB OTHER RESP ORIENTATION from 02/09/2016 in [facility]  PHQ-2 Total Score  PHQ-9 Total Score  8  -  23       Assessment and Plan: Brock[place] is a 38 year old male who is married, on disability, lives in [place], has a history of depression, pulmonary fibrosis,  GERD, COPD, asthma, tachycardia, presented to clinic today for a follow-up visit.  Patient is biologically predisposed given his family history.  He also has psychosocial stressors of his mother's health problems.  Patient will continue to benefit from psychotherapy sessions.  Patient is currently tolerating the weaning off of Klonopin well.  Plan as noted below.  Plan MDD-improving Lexapro 20 mg p.o. daily  For GAD-improving Continue Lexapro. Continue CBT  For panic attacks- has it on and off. Discussed with patient to take Seroquel 25 to 50 mg as needed for anxiety attacks. Continue to wean off Klonopin.  Provided him information to taper off his Klonopin.  Week 1 Start taking half tablet on Sunday PM Continue half tablet on every day AM  Thursday and Saturday PM - no Klonopin ( that's what you are doing now).   Week 2 Take half tablet on Wednesday PM Continue half tablet on every day AM  Thursday and Saturday PM - no Klonopin ( that's what you are doing now).  Week 3 Take half tablet Friday PM Continue half tablet on every day AM  Thursday and Saturday PM - no Klonopin ( that's what you are doing now).  Week 4 Take half tablet Tuesday PM Continue half tablet on every day AM  Thursday and Saturday PM - no Klonopin ( that's what you are doing now).  I have spent atleast 15 minutes  face to face with patient today. More than 50 % of the time was spent for psychoeducation and supportive psychotherapy and care coordination.  This note was generated in part or whole with voice recognition software. Voice recognition is usually quite accurate but there are transcription errors that can and very often do occur. I apologize for any typographical errors that were not detected and corrected.             Jomarie Longs, MD 01/15/2019, 5:44 PM

## 2019-01-15 NOTE — Patient Instructions (Signed)
Week 1 Start taking half tablet on Sunday PM Continue half tablet on every day AM  Thursday and Saturday PM - no Klonopin ( that's what you are doing now).   Week 2 Take half tablet on Wednesday PM Continue half tablet on every day AM  Thursday and Saturday PM - no Klonopin ( that's what you are doing now).  Week 3 Take half tablet Friday PM Continue half tablet on every day AM  Thursday and Saturday PM - no Klonopin ( that's what you are doing now).  Week 4 Take half tablet Tuesday PM Continue half tablet on every day AM  Thursday and Saturday PM - no Klonopin ( that's what you are doing now).

## 2019-01-16 ENCOUNTER — Ambulatory Visit: Payer: Medicare Other | Admitting: Licensed Clinical Social Worker

## 2019-01-31 DIAGNOSIS — I493 Ventricular premature depolarization: Secondary | ICD-10-CM | POA: Insufficient documentation

## 2019-02-18 ENCOUNTER — Ambulatory Visit: Payer: Medicare Other | Admitting: Anesthesiology

## 2019-02-18 ENCOUNTER — Encounter: Payer: Self-pay | Admitting: *Deleted

## 2019-02-18 ENCOUNTER — Encounter
Admission: RE | Disposition: A | Discharge status: Home / Self Care | Payer: Self-pay | Source: Ambulatory Visit | Attending: Internal Medicine

## 2019-02-18 ENCOUNTER — Ambulatory Visit
Admission: RE | Admit: 2019-02-18 | Discharge: 2019-02-18 | Disposition: A | Discharge status: Home / Self Care | Payer: Medicare Other | Source: Ambulatory Visit | Attending: Internal Medicine | Admitting: Internal Medicine

## 2019-02-18 DIAGNOSIS — J8410 Pulmonary fibrosis, unspecified: Secondary | ICD-10-CM | POA: Insufficient documentation

## 2019-02-18 DIAGNOSIS — Z79899 Other long term (current) drug therapy: Secondary | ICD-10-CM | POA: Diagnosis not present

## 2019-02-18 DIAGNOSIS — F329 Major depressive disorder, single episode, unspecified: Secondary | ICD-10-CM | POA: Insufficient documentation

## 2019-02-18 DIAGNOSIS — Z881 Allergy status to other antibiotic agents status: Secondary | ICD-10-CM | POA: Diagnosis not present

## 2019-02-18 DIAGNOSIS — X58XXXA Exposure to other specified factors, initial encounter: Secondary | ICD-10-CM | POA: Diagnosis not present

## 2019-02-18 DIAGNOSIS — F419 Anxiety disorder, unspecified: Secondary | ICD-10-CM | POA: Insufficient documentation

## 2019-02-18 DIAGNOSIS — Z87891 Personal history of nicotine dependence: Secondary | ICD-10-CM | POA: Diagnosis not present

## 2019-02-18 DIAGNOSIS — K449 Diaphragmatic hernia without obstruction or gangrene: Secondary | ICD-10-CM | POA: Insufficient documentation

## 2019-02-18 DIAGNOSIS — Z880 Allergy status to penicillin: Secondary | ICD-10-CM | POA: Diagnosis not present

## 2019-02-18 DIAGNOSIS — K219 Gastro-esophageal reflux disease without esophagitis: Secondary | ICD-10-CM | POA: Insufficient documentation

## 2019-02-18 DIAGNOSIS — Z7951 Long term (current) use of inhaled steroids: Secondary | ICD-10-CM | POA: Diagnosis not present

## 2019-02-18 DIAGNOSIS — T18128A Food in esophagus causing other injury, initial encounter: Secondary | ICD-10-CM | POA: Diagnosis not present

## 2019-02-18 DIAGNOSIS — J449 Chronic obstructive pulmonary disease, unspecified: Secondary | ICD-10-CM | POA: Diagnosis not present

## 2019-02-18 DIAGNOSIS — K210 Gastro-esophageal reflux disease with esophagitis: Secondary | ICD-10-CM | POA: Diagnosis not present

## 2019-02-18 DIAGNOSIS — K2970 Gastritis, unspecified, without bleeding: Secondary | ICD-10-CM | POA: Insufficient documentation

## 2019-02-18 DIAGNOSIS — M1710 Unilateral primary osteoarthritis, unspecified knee: Secondary | ICD-10-CM | POA: Diagnosis not present

## 2019-02-18 DIAGNOSIS — R1314 Dysphagia, pharyngoesophageal phase: Secondary | ICD-10-CM | POA: Diagnosis present

## 2019-02-18 HISTORY — PX: ESOPHAGOGASTRODUODENOSCOPY: SHX5428

## 2019-02-18 SURGERY — EGD (ESOPHAGOGASTRODUODENOSCOPY)
Anesthesia: General

## 2019-02-18 MED ORDER — FENTANYL CITRATE (PF) 100 MCG/2ML IJ SOLN: 25.0000 ug | INTRAMUSCULAR | Status: DC | PRN

## 2019-02-18 MED ORDER — PROMETHAZINE HCL 25 MG/ML IJ SOLN: 6.2500 mg | INTRAMUSCULAR | Status: DC | PRN

## 2019-02-18 MED ORDER — SUCCINYLCHOLINE CHLORIDE 20 MG/ML IJ SOLN
INTRAMUSCULAR | Status: DC | PRN
  Administered 2019-02-18: 120 mg via INTRAVENOUS

## 2019-02-18 MED ORDER — ROCURONIUM BROMIDE 100 MG/10ML IV SOLN
INTRAVENOUS | Status: DC | PRN
  Administered 2019-02-18: 25 mg via INTRAVENOUS
  Administered 2019-02-18: 5 mg via INTRAVENOUS

## 2019-02-18 MED ORDER — FENTANYL CITRATE (PF) 100 MCG/2ML IJ SOLN
INTRAMUSCULAR | Status: AC
  Filled 2019-02-18: qty 2

## 2019-02-18 MED ORDER — SUGAMMADEX SODIUM 200 MG/2ML IV SOLN
INTRAVENOUS | Status: DC | PRN
  Administered 2019-02-18: 200 mg via INTRAVENOUS

## 2019-02-18 MED ORDER — PROPOFOL 10 MG/ML IV BOLUS
INTRAVENOUS | Status: DC | PRN
  Administered 2019-02-18: 200 mg via INTRAVENOUS

## 2019-02-18 MED ORDER — DEXAMETHASONE SODIUM PHOSPHATE 10 MG/ML IJ SOLN
INTRAMUSCULAR | Status: DC | PRN
  Administered 2019-02-18: 4 mg via INTRAVENOUS

## 2019-02-18 MED ORDER — ONDANSETRON HCL 4 MG/2ML IJ SOLN
INTRAMUSCULAR | Status: DC | PRN
  Administered 2019-02-18: 4 mg via INTRAVENOUS

## 2019-02-18 MED ORDER — FENTANYL CITRATE (PF) 100 MCG/2ML IJ SOLN
INTRAMUSCULAR | Status: DC | PRN
  Administered 2019-02-18: 50 ug via INTRAVENOUS

## 2019-02-18 MED ORDER — LIDOCAINE HCL (CARDIAC) PF 100 MG/5ML IV SOSY
PREFILLED_SYRINGE | INTRAVENOUS | Status: DC | PRN
  Administered 2019-02-18: 60 mg via INTRAVENOUS

## 2019-02-18 MED ORDER — SODIUM CHLORIDE 0.9 % IV SOLN
INTRAVENOUS | Status: DC
  Administered 2019-02-18: 15:00:00 via INTRAVENOUS

## 2019-02-18 MED ORDER — LIDOCAINE HCL (PF) 2 % IJ SOLN
INTRAMUSCULAR | Status: AC
  Filled 2019-02-18: qty 10

## 2019-02-18 MED ORDER — MIDAZOLAM HCL 5 MG/5ML IJ SOLN
INTRAMUSCULAR | Status: DC | PRN
  Administered 2019-02-18: 2 mg via INTRAVENOUS

## 2019-02-18 MED ORDER — PROPOFOL 500 MG/50ML IV EMUL
INTRAVENOUS | Status: AC
  Filled 2019-02-18: qty 50

## 2019-02-18 MED ORDER — MIDAZOLAM HCL 2 MG/2ML IJ SOLN
INTRAMUSCULAR | Status: AC
  Filled 2019-02-18: qty 2

## 2019-02-18 NOTE — Anesthesia Preprocedure Evaluation (Signed)
Anesthesia Evaluation  Patient identified by MRN, date of birth, ID band Patient awake    Reviewed: Allergy & Precautions, NPO status , Patient's Chart, lab work & pertinent test results  History of Anesthesia Complications Negative for: history of anesthetic complications  Airway Mallampati: II  TM Distance: >3 FB Neck ROM: Full    Dental  (+) Missing, Poor Dentition   Pulmonary asthma , neg sleep apnea, COPD,  COPD inhaler, former smoker,  Pulmonary fibrosis   breath sounds clear to auscultation- rhonchi (-) wheezing      Cardiovascular (-) hypertension(-) CAD, (-) Past MI, (-) Cardiac Stents and (-) CABG  Rhythm:Regular Rate:Normal - Systolic murmurs and - Diastolic murmurs    Neuro/Psych neg Seizures PSYCHIATRIC DISORDERS Anxiety Depression negative neurological ROS     GI/Hepatic Neg liver ROS, GERD  ,Esophageal food impaction   Endo/Other  negative endocrine ROS  Renal/GU negative Renal ROS     Musculoskeletal  (+) Arthritis ,   Abdominal (+) + obese,   Peds  Hematology negative hematology ROS (+)   Anesthesia Other Findings Past Medical History: No date: Anxiety No date: Arthritis     Comment:  knee No date: Asthma No date: Chronic mental illness No date: COPD (chronic obstructive pulmonary disease) (HCC) No date: Depression No date: GERD (gastroesophageal reflux disease) No date: Heart palpitations No date: Panic attack No date: Pulmonary fibrosis (HCC)   Reproductive/Obstetrics                             Anesthesia Physical Anesthesia Plan  ASA: III and emergent  Anesthesia Plan: General   Post-op Pain Management:    Induction: Intravenous, Rapid sequence and Cricoid pressure planned  PONV Risk Score and Plan: 1 and Ondansetron and Midazolam  Airway Management Planned: Oral ETT  Additional Equipment:   Intra-op Plan:   Post-operative Plan: Extubation in  OR  Informed Consent: I have reviewed the patients History and Physical, chart, labs and discussed the procedure including the risks, benefits and alternatives for the proposed anesthesia with the patient or authorized representative who has indicated his/her understanding and acceptance.     Dental advisory given  Plan Discussed with: CRNA and Anesthesiologist  Anesthesia Plan Comments:         Anesthesia Quick Evaluation

## 2019-02-18 NOTE — Transfer of Care (Signed)
Immediate Anesthesia Transfer of Care Note  Patient: Steven [name]  Procedure(s) Performed: ESOPHAGOGASTRODUODENOSCOPY (EGD) (N/A )  Patient Location: PACU  Anesthesia Type:General  Level of Consciousness: sedated  Airway & Oxygen Therapy: Patient Spontanous Breathing and Patient connected to face mask oxygen  Post-op Assessment: Report given to RN and Post -op Vital signs reviewed and stable  Post vital signs: Reviewed and stable  Last Vitals:  Vitals Value Taken Time  BP 137/59 02/18/2019  3:36 PM  Temp 36.4 C 02/18/2019  3:36 PM  Pulse 116 02/18/2019  3:41 PM  Resp 13 02/18/2019  3:41 PM  SpO2 96 % 02/18/2019  3:41 PM  Vitals shown include unvalidated device data.  Last Pain:  Vitals:   02/18/19 1536  TempSrc:   PainSc: 0-No pain         Complications: No apparent anesthesia complications

## 2019-02-18 NOTE — Brief Op Note (Signed)
Pt intubated by N.Raiford Simmonds, CRNA successfully. Dr. Priscella Mann assisting.

## 2019-02-18 NOTE — Op Note (Signed)
[facility] Gastroenterology Patient Name: Steven [name] Procedure Date: 02/18/2019 3:05 PM MRN: [id number] Account #: [id number] Date of Birth: [birth year] Admit Type: Outpatient Age: 38 Room: [facility] ENDO ROOM 4 Gender: Male Note Status: Finalized Procedure:            Upper GI endoscopy Indications:          Esophageal dysphagia, Foreign body in the esophagus Providers:            Boykin Nearing. Norma Fredrickson MD, MD Referring MD:         Landry Dyke, NP (Referring MD) Medicines:            Propofol per Anesthesia Complications:        No immediate complications. Procedure:            Pre-Anesthesia Assessment:                       - The risks and benefits of the procedure and the                        sedation options and risks were discussed with the                        patient. All questions were answered and informed                        consent was obtained.                       - The risks and benefits of the procedure and the                        sedation options and risks were discussed with the                        patient. All questions were answered and informed                        consent was obtained.                       - Patient identification and proposed procedure were                        verified prior to the procedure by the nurse. The                        procedure was verified in the pre-procedure area.                       - ASA Grade Assessment: III - A patient with severe                        systemic disease.                       - After reviewing the risks and benefits, the patient                        was deemed in satisfactory condition to undergo the  procedure.                       After obtaining informed consent, the endoscope was                        passed under direct vision. Throughout the procedure,                        the patient's blood pressure, pulse, and oxygen             saturations were monitored continuously. The Endoscope                        was introduced through the mouth, and advanced to the                        third part of duodenum. The upper GI endoscopy was                        accomplished without difficulty. The patient tolerated                        the procedure well. Findings:      Food was found in the middle third of the esophagus. Removal was       accomplished with an insufflation and irrigation. The pieces were small       didn't appear obstructiv.      LA Grade C (one or more mucosal breaks continuous between tops of 2 or       more mucosal folds, less than 75% circumference) esophagitis with no       bleeding was found in the distal esophagus. Biopsies were obtained from       the proximal and distal esophagus with cold forceps for histology of       suspected eosinophilic esophagitis.      A small amount of food (residue) was found in the gastric fundus.      Diffuse mild inflammation characterized by erythema was found in the       gastric body.      A 1 cm hiatal hernia was present.      The examined duodenum was normal.      The exam was otherwise without abnormality. Impression:           - Food was found in the esophagus. Removal was                        successful.                       - LA Grade C reflux esophagitis. Biopsied.                       - A small amount of food (residue) in the stomach.                       - Gastritis.                       - 1 cm hiatal hernia.                       -  Normal examined duodenum.                       - The examination was otherwise normal. Recommendation:       - Patient has a contact number available for                        emergencies. The signs and symptoms of potential                        delayed complications were discussed with the patient.                        Return to normal activities tomorrow. Written discharge                         instructions were provided to the patient.                       - Mechanical soft diet for 2 weeks.                       - Use Protonix (pantoprazole) 40 mg PO BID.                       - Await pathology results.                       - Return to my office in 3 weeks. Procedure Code(s):    --- Professional ---                       [phone], Esophagogastroduodenoscopy, flexible, transoral;                        with removal of foreign body(s)                       43239, Esophagogastroduodenoscopy, flexible, transoral;                        with biopsy, single or multiple Diagnosis Code(s):    --- Professional ---                       R13.14, Dysphagia, pharyngoesophageal phase                       K44.9, Diaphragmatic hernia without obstruction or                        gangrene                       K29.70, Gastritis, unspecified, without bleeding                       T18.2XXA, Foreign body in stomach, initial encounter                       K21.0, Gastro-esophageal reflux disease with esophagitis                       T18.128A, Food in esophagus causing other injury,  initial encounter                       T18.108A, Unspecified foreign body in esophagus causing                        other injury, initial encounter CPT copyright 2019 American Medical Association. All rights reserved. The codes documented in this report are preliminary and upon coder review may  be revised to meet current compliance requirements. Stanton Kidneyeodoro K Toledo MD, MD 02/18/2019 3:35:45 PM This report has been signed electronically. Number of Addenda: 0 Note Initiated On: 02/18/2019 3:05 PM      [facility]

## 2019-02-18 NOTE — Anesthesia Procedure Notes (Signed)
Procedure Name: Intubation Date/Time: 02/18/2019 3:11 PM Performed by: Lily Kocher, CRNA Pre-anesthesia Checklist: Patient identified, Patient being monitored, Timeout performed, Emergency Drugs available and Suction available Patient Re-evaluated:Patient Re-evaluated prior to induction Oxygen Delivery Method: Circle system utilized Preoxygenation: Pre-oxygenation with 100% oxygen Induction Type: IV induction and Rapid sequence Laryngoscope Size: 3 and McGraph Grade View: Grade I Tube type: Oral Tube size: 7.0 mm Number of attempts: 1 Airway Equipment and Method: Stylet Placement Confirmation: ETT inserted through vocal cords under direct vision,  positive ETCO2 and breath sounds checked- equal and bilateral Secured at: 22 cm Tube secured with: Tape Dental Injury: Teeth and Oropharynx as per pre-operative assessment

## 2019-02-18 NOTE — Anesthesia Post-op Follow-up Note (Signed)
Anesthesia QCDR form completed.        

## 2019-02-18 NOTE — Interval H&P Note (Signed)
History and Physical Interval Note:  02/18/2019 2:54 PM  Steven [name]  has presented today for surgery, with the diagnosis of food impaction of esohagus.  The various methods of treatment have been discussed with the patient and family. After consideration of risks, benefits and other options for treatment, the patient has consented to  Procedure(s): ESOPHAGOGASTRODUODENOSCOPY (EGD) (N/A) as a surgical intervention.  The patient's history has been reviewed, patient examined, no change in status, stable for surgery.  I have reviewed the patient's chart and labs.  Questions were answered to the patient's satisfaction.     [place], [place]

## 2019-02-18 NOTE — H&P (Signed)
Outpatient short stay form Pre-procedure 02/18/2019 2:52 PM [employer] K. Norma Fredrickson, M.D.  Primary Physician: [facility]  Reason for visit:  Dysphagia, esophageal foreign body.  History of present illness:  38 y/o male presents acutely after being unable to swallow significant amount of liquid or ANY solids for two days. He attempted to eat chicken 2 days ago which "got stuck" at the area of the suprasternal notch and upper sternal region. Patient has hx of GERD and required elective EGD with esophageal dilation for dysphagia in 2018. Denies melena, hematochezia, hemetemesis, fever, cough or chest pain.    No current facility-administered medications for this encounter.   Medications Prior to Admission  Medication Sig Dispense Refill Last Dose  . acetaminophen (TYLENOL) 500 MG tablet Take 1,000 mg by mouth every 6 (six) hours as needed for moderate pain.   Past Week at Unknown time  . albuterol (PROVENTIL) (2.5 MG/3ML) 0.083% nebulizer solution Inhale 3 mLs (2.5 mg total) into the lungs every 6 (six) hours as needed for wheezing or shortness of breath. 75 mL 4 Past Week at Unknown time  . ATROVENT HFA 17 MCG/ACT inhaler Inhale 2 puffs into the lungs every 6 (six) hours as needed for wheezing. 1 Inhaler 0 Past Week at Unknown time  . BREO ELLIPTA 100-25 MCG/INH AEPB Inhale 1 puff into the lungs daily. 1 each 3 Past Week at Unknown time  . clonazePAM (KLONOPIN) 1 MG tablet Take 0.5-1 tablets (0.5-1 mg total) by mouth as directed. Twice a day as needed( as discussed) 54 tablet 0 02/18/2019 at 1000  . COMBIVENT RESPIMAT 20-100 MCG/ACT AERS respimat Inhale 1 puff into the lungs every 6 (six) hours as needed for wheezing. 1 Inhaler 2 Past Week at Unknown time  . escitalopram (LEXAPRO) 20 MG tablet Take 1 tablet (20 mg total) by mouth daily. 90 tablet 0 Past Week at Unknown time  . fexofenadine (ALLEGRA) 180 MG tablet Take 180 mg by mouth daily as needed for allergies.    Past Week at  Unknown time  . fluticasone (FLONASE) 50 MCG/ACT nasal spray Place 1 spray into both nostrils daily.   Past Week at Unknown time  . omeprazole (PRILOSEC) 20 MG capsule Take 20 mg by mouth 2 (two) times daily before a meal.    Past Week at Unknown time  . PROAIR HFA 108 (90 Base) MCG/ACT inhaler INAHLE 2 PUFFS BY MOUTH EVERY 4 TO 6 HOURS AS NEEDED 8.5 g 0 Past Week at Unknown time  . QUEtiapine (SEROQUEL) 25 MG tablet Take 0.5-1 tablets (12.5-25 mg total) by mouth daily as needed. For severe agitation,anxiety 30 tablet 1 Past Week at Unknown time  . EPIPEN 2-PAK 0.3 MG/0.3ML SOAJ injection 0.3 mg.    Taking  . OXYGEN Inhale into the lungs. 2LITERS AT NIGHT AND WHEN SICK   Taking     Allergies  Allergen Reactions  . Diphenhydramine Anaphylaxis  . Guaifenesin Swelling    Bodily Swelling  . Doxycycline     tachycardia  . Pork-Derived Products   . Amoxicillin Nausea Only    Other reaction(s): Other (See Comments) Increases anxiety Other reaction(s): Other (See Comments) Increases anxiety     Past Medical History:  Diagnosis Date  . Anxiety   . Arthritis    knee  . Asthma   . Chronic mental illness   . COPD (chronic obstructive pulmonary disease) (HCC)   . Depression   . GERD (gastroesophageal reflux disease)   . Heart palpitations   .  Panic attack   . Pulmonary fibrosis (HCC)     Review of systems:  Otherwise negative.    Physical Exam  Gen: Alert, oriented. Appears stated age.  HEENT: [place]/AT. PERRLA. Lungs: CTA, no wheezes. CV: RR nl S1, S2. Abd: soft, benign, no masses. BS+ Ext: No edema. Pulses 2+    Planned procedures: Proceed with urgen EGD. The patient understands the nature of the planned procedure, indications, risks, alternatives and potential complications including but not limited to bleeding, infection, perforation, damage to internal organs and possible oversedation/side effects from anesthesia. The patient agrees and gives consent to proceed.  Please  refer to procedure notes for findings, recommendations and patient disposition/instructions.     Casady Voshell K. Norma Fredricksonoledo, M.D. Gastroenterology 02/18/2019  2:52 PM

## 2019-02-20 ENCOUNTER — Encounter: Payer: Self-pay | Admitting: Internal Medicine

## 2019-02-20 LAB — SURGICAL PATHOLOGY

## 2019-02-20 NOTE — Anesthesia Postprocedure Evaluation (Signed)
Anesthesia Post Note  Patient: Steven [name]  Procedure(s) Performed: ESOPHAGOGASTRODUODENOSCOPY (EGD) (N/A )  Patient location during evaluation: PACU Anesthesia Type: General Level of consciousness: awake and alert Pain management: pain level controlled Vital Signs Assessment: post-procedure vital signs reviewed and stable Respiratory status: spontaneous breathing, nonlabored ventilation, respiratory function stable and patient connected to nasal cannula oxygen Cardiovascular status: blood pressure returned to baseline and stable Postop Assessment: no apparent nausea or vomiting Anesthetic complications: no     Last Vitals:  Vitals:   02/18/19 1551 02/18/19 1606  BP: 113/82 124/63  Pulse: (!) 102 92  Resp: 11 14  Temp:  36.6 C  SpO2: 95% 96%    Last Pain:  Vitals:   02/18/19 1606  TempSrc:   PainSc: 0-No pain                 Yevette Edwards

## 2019-03-04 ENCOUNTER — Ambulatory Visit (INDEPENDENT_AMBULATORY_CARE_PROVIDER_SITE_OTHER): Payer: Medicare Other | Admitting: Psychiatry

## 2019-03-04 ENCOUNTER — Other Ambulatory Visit: Payer: Self-pay

## 2019-03-04 ENCOUNTER — Encounter: Payer: Self-pay | Admitting: Psychiatry

## 2019-03-04 DIAGNOSIS — F330 Major depressive disorder, recurrent, mild: Secondary | ICD-10-CM

## 2019-03-04 DIAGNOSIS — F410 Panic disorder [episodic paroxysmal anxiety] without agoraphobia: Secondary | ICD-10-CM

## 2019-03-04 DIAGNOSIS — F411 Generalized anxiety disorder: Secondary | ICD-10-CM | POA: Diagnosis not present

## 2019-03-04 MED ORDER — CLONAZEPAM 1 MG PO TABS: 0.5000 mg | ORAL_TABLET | ORAL | 0 refills | Status: DC

## 2019-03-04 MED ORDER — PROPRANOLOL HCL 10 MG PO TABS: 10.0000 mg | ORAL_TABLET | Freq: Two times a day (BID) | ORAL | 1 refills | Status: DC | PRN

## 2019-03-04 MED ORDER — QUETIAPINE FUMARATE 50 MG PO TABS: 50.0000 mg | ORAL_TABLET | Freq: Every day | ORAL | 1 refills | Status: DC

## 2019-03-04 NOTE — Patient Instructions (Addendum)
Week 1 Start taking half tablet AM tuesday.    Week 2 Take half tablet on Wednesday AM   Week 3 Take half tablet Friday AM   Week 4 Take half tablet Saturday AM.  Continue take half tablet up to 1-2 times pm only if you need it and only for severe anxiety attacks.

## 2019-03-04 NOTE — Progress Notes (Signed)
Virtual Visit via Video Note  I connected with Steven [name] on 03/04/19 at  4:45 PM EDT by a video enabled telemedicine application and verified that I am speaking with the correct person using two identifiers.   I discussed the limitations of evaluation and management by telemedicine and the availability of in person appointments. The patient expressed understanding and agreed to proceed.     I discussed the assessment and treatment plan with the patient. The patient was provided an opportunity to ask questions and all were answered. The patient agreed with the plan and demonstrated an understanding of the instructions.   The patient was advised to call back or seek an in-person evaluation if the symptoms worsen or if the condition fails to improve as anticipated.   [place] MD  OP Progress Note  03/04/2019 5:54 PM Steven [name]  MRN:  [id number]  Chief Complaint:  Chief Complaint    Follow-up     HPI: Steven [name] is a 38 year old Caucasian male, on SSD, lives in [place], has a history of generalized anxiety disorder, tachycardia, asthma, COPD, pulmonary fibrosis, history of thyroid nodules, GERD, cardiomyopathy was evaluated by telemedicine today.  Patient reports he has not really followed through with recommendations to come off of Klonopin as discussed last visit.  Patient reports he continues to take the Klonopin half tablet 3-4 times as needed some weeks pm.  Patient was not very specific about how much he takes and could not elaborate much more on that.  Patient seemed very upset when it was discussed with patient that he is currently being weaned off of the Klonopin.  Reminded patient that this was discussed with him during his initial visit here and he had agreed to the same.  Also discussed with patient that based on controlled substance database he had picked up a prescription filled by his previous provider on January 14, 2019.  Patient reported that he was not aware of how it got  refilled.  [facility] was called with patient in session and writer spoke to the pharmacist-Chris.  Discussed to deactivate the prescriptions which were sent over by his previous provider in November 2019.  Patient reported that he had some recent health problems.  He reports he choked on a piece of chicken last week and underwent a procedure.  Patient reports that he has upcoming appointment with his provider to follow-up.  Reviewed medical records in [facility] per Dr. Alice Reichert dated 02/18/2019-' patient presents acutely after being unable to swallow significant amount of liquid or any solid for 2 days.  He attempted to eat chicken 2 days ago which got stuck.  Seen with urgent EGD.'  Patient hence underwent EGD.   Patient today reports he hence continues to struggle with some anxiety symptoms.  He also struggles with sleep.  He does not think the Seroquel at this low dosage is helpful.  Discussed readjusting his medication dosage.  Patient initially became very upset stating that he has been taking Klonopin for several years and does not want to be on multiple medications at this time.  Provided medication education, discussed the risk of being on Klonopin for too long.  Discussed with patient that he still will have Klonopin available however it will be tapered down gradually ,half tablet every week which is not much.  Patient even though hesitant agreed with plan.  Discussed readjusting Seroquel to 50 mg scheduled at bedtime.  Also discussed adding propranolol as needed for anxiety attacks.  Patient  was referred for psychotherapy sessions previously however he has not met with Merleen Nicely his therapist in a long time.  This was discussed with patient.  He agrees to call the clinic back tomorrow to schedule an appointment.  Patient denies any suicidality, homicidality or perceptual disturbances.   Visit Diagnosis:    ICD-10-CM   1. GAD (generalized anxiety disorder) F41.1 QUEtiapine (SEROQUEL) 50 MG  tablet  2. Panic disorder F41.0 QUEtiapine (SEROQUEL) 50 MG tablet    propranolol (INDERAL) 10 MG tablet    clonazePAM (KLONOPIN) 1 MG tablet  3. MDD (major depressive disorder), recurrent episode, mild ([place]) F33.0     Past Psychiatric History: I have reviewed past psychiatric history from my progress note on 09/13/2018.  Past trials of BuSpar, Wellbutrin, Abilify, Klonopin, Lexapro, Effexor  Past Medical History:  Past Medical History:  Diagnosis Date  . Anxiety   . Arthritis    knee  . Asthma   . Chronic mental illness   . COPD (chronic obstructive pulmonary disease) ([place])   . Depression   . GERD (gastroesophageal reflux disease)   . Heart palpitations   . Panic attack   . Pulmonary fibrosis ([place])     Past Surgical History:  Procedure Laterality Date  . ESOPHAGOGASTRODUODENOSCOPY N/A 02/18/2019   Procedure: ESOPHAGOGASTRODUODENOSCOPY (EGD);  Surgeon: Toledo, Benay Pike, MD;  Location: [facility];  Service: Gastroenterology;  Laterality: N/A;  . ESOPHAGOGASTRODUODENOSCOPY (EGD) WITH PROPOFOL N/A 09/05/2017   Procedure: ESOPHAGOGASTRODUODENOSCOPY (EGD) WITH PROPOFOL;  Surgeon: Toledo, Benay Pike, MD;  Location: [facility];  Service: Gastroenterology;  Laterality: N/A;  . none    . TUBES IN EARS      Family Psychiatric History: Reviewed family psychiatric history from my progress note on 09/13/2018.  Family History:  Family History  Problem Relation Age of Onset  . COPD Mother   . Asthma Mother   . Mental illness Mother   . COPD Father   . Pulmonary fibrosis Father   . Lung cancer Father     Social History: Reviewed social history from my progress note on 09/13/2018. Social History   Socioeconomic History  . Marital status: Married    Spouse name: heather  . Number of children: 4  . Years of education: Not on file  . Highest education level: Associate degree: occupational, Hotel manager, or vocational program  Occupational History  . Not on file  Social Needs   . Financial resource strain: Not hard at all  . Food insecurity:    Worry: Never true    Inability: Never true  . Transportation needs:    Medical: No    Non-medical: No  Tobacco Use  . Smoking status: Former Smoker    Packs/day: 0.50    Years: 2.00    Pack years: 1.00    Types: Cigarettes    Last attempt to quit: 10/31/2000    Years since quitting: 18.3  . Smokeless tobacco: Current User    Types: Chew  . Tobacco comment: 2003  Substance and Sexual Activity  . Alcohol use: No    Alcohol/week: 0.0 standard drinks  . Drug use: No  . Sexual activity: Yes  Lifestyle  . Physical activity:    Days per week: 0 days    Minutes per session: 0 min  . Stress: Rather much  Relationships  . Social connections:    Talks on phone: Not on file    Gets together: Not on file    Attends religious service: Never    Active  member of club or organization: No    Attends meetings of clubs or organizations: Never    Relationship status: Married  Other Topics Concern  . Not on file  Social History Narrative  . Not on file    Allergies:  Allergies  Allergen Reactions  . Diphenhydramine Anaphylaxis  . Guaifenesin Swelling    Bodily Swelling  . Doxycycline     tachycardia  . Pork-Derived Products   . Amoxicillin Nausea Only    Other reaction(s): Other (See Comments) Increases anxiety Other reaction(s): Other (See Comments) Increases anxiety    Metabolic Disorder Labs: No results found for: HGBA1C, MPG No results found for: PROLACTIN No results found for: CHOL, TRIG, HDL, CHOLHDL, VLDL, LDLCALC Lab Results  Component Value Date   TSH 1.550 09/14/2018    Therapeutic Level Labs: No results found for: LITHIUM No results found for: VALPROATE No components found for:  CBMZ  Current Medications: Current Outpatient Medications  Medication Sig Dispense Refill  . acetaminophen (TYLENOL) 500 MG tablet Take 1,000 mg by mouth every 6 (six) hours as needed for moderate pain.    Marland Kitchen  albuterol (PROVENTIL) (2.5 MG/3ML) 0.083% nebulizer solution Inhale 3 mLs (2.5 mg total) into the lungs every 6 (six) hours as needed for wheezing or shortness of breath. 75 mL 4  . ATROVENT HFA 17 MCG/ACT inhaler Inhale 2 puffs into the lungs every 6 (six) hours as needed for wheezing. 1 Inhaler 0  . BREO ELLIPTA 100-25 MCG/INH AEPB Inhale 1 puff into the lungs daily. 1 each 3  . clonazePAM (KLONOPIN) 1 MG tablet Take 0.5 tablets (0.5 mg total) by mouth as directed. Twice a day as needed( as discussed) 25 tablet 0  . COMBIVENT RESPIMAT 20-100 MCG/ACT AERS respimat Inhale 1 puff into the lungs every 6 (six) hours as needed for wheezing. 1 Inhaler 2  . EPIPEN 2-PAK 0.3 MG/0.3ML SOAJ injection 0.3 mg.     . escitalopram (LEXAPRO) 20 MG tablet Take 1 tablet (20 mg total) by mouth daily. 90 tablet 0  . fexofenadine (ALLEGRA) 180 MG tablet Take 180 mg by mouth daily as needed for allergies.     . fluticasone (FLONASE) 50 MCG/ACT nasal spray Place 1 spray into both nostrils daily.    Marland Kitchen omeprazole (PRILOSEC) 20 MG capsule Take 20 mg by mouth 2 (two) times daily before a meal.     . OXYGEN Inhale into the lungs. 2LITERS AT NIGHT AND WHEN SICK    . PROAIR HFA 108 (90 Base) MCG/ACT inhaler INAHLE 2 PUFFS BY MOUTH EVERY 4 TO 6 HOURS AS NEEDED 8.5 g 0  . propranolol (INDERAL) 10 MG tablet Take 1 tablet (10 mg total) by mouth 2 (two) times daily as needed. FOR SEVERE ANXIETY ATTACKS 60 tablet 1  . QUEtiapine (SEROQUEL) 50 MG tablet Take 1 tablet (50 mg total) by mouth at bedtime. MOOD 30 tablet 1   No current facility-administered medications for this visit.      Musculoskeletal: Strength & Muscle Tone: UTA Gait & Station: normal Patient leans: N/A  Psychiatric Specialty Exam: Review of Systems  Psychiatric/Behavioral: The patient is nervous/anxious and has insomnia.   All other systems reviewed and are negative.   There were no vitals taken for this visit.There is no height or weight on file to  calculate BMI.  General Appearance: Casual  Eye Contact:  Fair  Speech:  Normal Rate  Volume:  Normal  Mood:  Anxious  Affect:  Congruent  Thought Process:  Goal Directed and Descriptions of Associations: Intact  Orientation:  Full (Time, Place, and Person)  Thought Content: Logical   Suicidal Thoughts:  No  Homicidal Thoughts:  No  Memory:  Immediate;   Fair Recent;   Fair Remote;   Fair  Judgement:  Fair  Insight:  Fair  Psychomotor Activity:  Normal  Concentration:  Concentration: Fair and Attention Span: Fair  Recall:  [employer] of Knowledge: Good  Language: Fair  Akathisia:  No  Handed:  Right  AIMS (if indicated): Denies tremors, rigidity, stiffness  Assets:  Communication Skills Desire for Improvement Social Support  ADL's:  Intact  Cognition: WNL  Sleep:  Poor   Screenings: PHQ2-9     Office Visit from 07/10/2018 in [facility], [facility] Office Visit from 01/18/2018 in [facility], [place] from 02/09/2016 in [place]  PHQ-2 Total Score  _0 PHQ-9 Total Score  8  -  23       Assessment and Plan: Steven [name] is a 38 year old male who is married, on disability, lives in [place], history of depression, pulmonary fibrosis, GERD, COPD, asthma, tachycardia was evaluated by telemedicine today.  Patient is biologically predisposed given his family history as well as his multiple health issues.  Patient also has psychosocial stressors of the current COVID-19 crisis, his mother's health problems and his own health issues.  Patient will continue to benefit from medication readjustment as well as psychotherapy sessions.  Plan MDD-improving Lexapro 20 mg p.o. daily  For GAD- unstable Continue Lexapro as prescribed Add Seroquel 50 mg p.o. nightly Add propranolol 10 mg p.o. twice daily PRN for severe anxiety attacks  For panic attacks- improving Discussed with patient to wean off Klonopin more.   As summarized above I have called his pharmacy to discuss about Klonopin being filled by 2 prescribers at the same time.  Patient reports he is unaware of this.  His pharmacist agrees to deactivate the provider prescription from November 2019. Provided patient with instructions to wean off Klonopin more.  He will start cutting Klonopin into half tablet 1 day a week in the AM and add 1 day every week for the next 4 weeks. He continues to take Klonopin half tablet PM ,as needed a few times during the week even though he was advised not to last visit.  Discussed with patient to continue to use the Klonopin 1-2 times a week p.m. as needed for anxiety attacks.  Discussed with patient try to not use it if possible. I have reviewed [place] controlled substance database.  Week 1 Start taking half tablet AM tuesday. Week 2 Take half tablet on Wednesday AM Week 3 Take half tablet Friday AM Week 4 Take half tablet Saturday AM. Hence he will add 1 day/week until he comes back for an appointment when he takes only half tablet in the morning.  Continue to take half tablet up to 1-2 times pm only if you need it and only for severe anxiety attacks.  Pt to reach out to our therapist Ms. Alden Hipp to make another appointment soon.  For insomnia- unstable Seroquel 50 mg will help.  Follow-up in clinic in 3 to 4 weeks or sooner if needed.  I have spent atleast 25 minutes non face to face with patient today. More than 50 % of the time was spent for psychoeducation and supportive psychotherapy and care coordination.  This note was generated in part  or whole with voice recognition software. Voice recognition is usually quite accurate but there are transcription errors that can and very often do occur. I apologize for any typographical errors that were not detected and corrected.             Ursula Alert, MD 03/05/2019, 8:54 AM

## 2019-03-05 ENCOUNTER — Encounter: Payer: Self-pay | Admitting: Psychiatry

## 2019-03-27 ENCOUNTER — Ambulatory Visit (INDEPENDENT_AMBULATORY_CARE_PROVIDER_SITE_OTHER): Payer: Medicare Other | Admitting: Psychiatry

## 2019-03-27 ENCOUNTER — Encounter: Payer: Self-pay | Admitting: Psychiatry

## 2019-03-27 ENCOUNTER — Other Ambulatory Visit: Payer: Self-pay

## 2019-03-27 DIAGNOSIS — F330 Major depressive disorder, recurrent, mild: Secondary | ICD-10-CM

## 2019-03-27 DIAGNOSIS — F410 Panic disorder [episodic paroxysmal anxiety] without agoraphobia: Secondary | ICD-10-CM

## 2019-03-27 DIAGNOSIS — F411 Generalized anxiety disorder: Secondary | ICD-10-CM

## 2019-03-27 MED ORDER — ESCITALOPRAM OXALATE 20 MG PO TABS: 20.0000 mg | ORAL_TABLET | Freq: Every day | ORAL | 0 refills | Status: DC

## 2019-03-27 MED ORDER — QUETIAPINE FUMARATE 50 MG PO TABS: 50.0000 mg | ORAL_TABLET | Freq: Every day | ORAL | 0 refills | Status: DC

## 2019-03-27 NOTE — Progress Notes (Signed)
Virtual Visit via Video Note  I connected with Steven [name] on 03/27/19 at  3:45 PM EDT by a video enabled telemedicine application and verified that I am speaking with the correct person using two identifiers.   I discussed the limitations of evaluation and management by telemedicine and the availability of in person appointments. The patient expressed understanding and agreed to proceed.   I discussed the assessment and treatment plan with the patient. The patient was provided an opportunity to ask questions and all were answered. The patient agreed with the plan and demonstrated an understanding of the instructions.   The patient was advised to call back or seek an in-person evaluation if the symptoms worsen or if the condition fails to improve as anticipated.   BH MD OP Progress Note  03/27/2019 5:24 PM Steven [name]  MRN:  [id number]  Chief Complaint:  Chief Complaint    Follow-up     HPI: Steven [name] is a 38 year old Caucasian male, on SSD, lives in [place], history of generalized anxiety disorder, panic disorder, MDD, tachycardia, asthma, COPD, pulmonary fibrosis, history of thyroid nodules, GERD, cardiomyopathy was evaluated by telemedicine today.  Patient today reports he continues to struggle with anxiety symptoms due to his current health problems.  Patient reports he had an appointment with his provider who is managing his swallowing problem and recently prescribed him medications.  He reports he was prescribed corticosteroid medication which he is advised to swallow and also omeprazole 40 mg.  He continues to struggle with pain on and off.  He does have an appointment in about 2 months and he could call them back if symptoms worsen.  He reports he has been able to cut back on his Klonopin more.  He reports he does not take any Klonopin in the evening.  He may take a half dosage 1-2 times every 2 weeks or so in the evening.  He has been cutting back on the morning dosage and  currently takes a half dosage 3-4 times a week in the morning.  Discussed with patient to continue to cut back on the Klonopin and he agrees with plan.  Provided him verbal instructions to do so.  Patient continues to be compliant on his Lexapro.  He however reports due to the situational stressors he does notice some anxiety symptoms on and off.  He reports he is compliant on the Seroquel.  He does not want to make any changes with his Lexapro today and wants to stay on it.  Patient reports he has upcoming appointment with his therapist coming up and he plans to keep it.  Patient denies any suicidality, homicidality or perceptual disturbances.   Visit Diagnosis:    ICD-10-CM   1. GAD (generalized anxiety disorder) F41.1 escitalopram (LEXAPRO) 20 MG tablet    QUEtiapine (SEROQUEL) 50 MG tablet  2. Panic disorder F41.0 QUEtiapine (SEROQUEL) 50 MG tablet  3. MDD (major depressive disorder), recurrent episode, mild (HCC) F33.0    improving    Past Psychiatric History: I have reviewed past psychiatric history from my progress note on 09/13/2018.  Past trials of BuSpar, Wellbutrin, Abilify, Klonopin, Lexapro, Effexor  Past Medical History:  Past Medical History:  Diagnosis Date  . Anxiety   . Arthritis    knee  . Asthma   . Chronic mental illness   . COPD (chronic obstructive pulmonary disease) (HCC)   . Depression   . GERD (gastroesophageal reflux disease)   . Heart palpitations   . Panic  attack   . Pulmonary fibrosis (HCC)     Past Surgical History:  Procedure Laterality Date  . ESOPHAGOGASTRODUODENOSCOPY N/A 02/18/2019   Procedure: ESOPHAGOGASTRODUODENOSCOPY (EGD);  Surgeon: Toledo, Boykin Nearingeodoro K, MD;  Location: [facility] ENDOSCOPY;  Service: Gastroenterology;  Laterality: N/A;  . ESOPHAGOGASTRODUODENOSCOPY (EGD) WITH PROPOFOL N/A 09/05/2017   Procedure: ESOPHAGOGASTRODUODENOSCOPY (EGD) WITH PROPOFOL;  Surgeon: Toledo, Boykin Nearingeodoro K, MD;  Location: [facility] ENDOSCOPY;  Service: Gastroenterology;   Laterality: N/A;  . none    . TUBES IN EARS      Family Psychiatric History: Reviewed family psychiatric history from my progress note on 09/13/2018.  Family History:  Family History  Problem Relation Age of Onset  . COPD Mother   . Asthma Mother   . Mental illness Mother   . COPD Father   . Pulmonary fibrosis Father   . Lung cancer Father     Social History: Reviewed social history from my progress note on 09/13/2018. Social History   Socioeconomic History  . Marital status: Married    Spouse name: heather  . Number of children: 4  . Years of education: Not on file  . Highest education level: Associate degree: occupational, Scientist, product/process developmenttechnical, or vocational program  Occupational History  . Not on file  Social Needs  . Financial resource strain: Not hard at all  . Food insecurity:    Worry: Never true    Inability: Never true  . Transportation needs:    Medical: No    Non-medical: No  Tobacco Use  . Smoking status: Former Smoker    Packs/day: 0.50    Years: 2.00    Pack years: 1.00    Types: Cigarettes    Last attempt to quit: 10/31/2000    Years since quitting: 18.4  . Smokeless tobacco: Current User    Types: Chew  . Tobacco comment: 2003  Substance and Sexual Activity  . Alcohol use: No    Alcohol/week: 0.0 standard drinks  . Drug use: No  . Sexual activity: Yes  Lifestyle  . Physical activity:    Days per week: 0 days    Minutes per session: 0 min  . Stress: Rather much  Relationships  . Social connections:    Talks on phone: Not on file    Gets together: Not on file    Attends religious service: Never    Active member of club or organization: No    Attends meetings of clubs or organizations: Never    Relationship status: Married  Other Topics Concern  . Not on file  Social History Narrative  . Not on file    Allergies:  Allergies  Allergen Reactions  . Diphenhydramine Anaphylaxis  . Guaifenesin Swelling    Bodily Swelling  . Doxycycline      tachycardia  . Pork-Derived Products   . Amoxicillin Nausea Only    Other reaction(s): Other (See Comments) Increases anxiety Other reaction(s): Other (See Comments) Increases anxiety    Metabolic Disorder Labs: No results found for: HGBA1C, MPG No results found for: PROLACTIN No results found for: CHOL, TRIG, HDL, CHOLHDL, VLDL, LDLCALC Lab Results  Component Value Date   TSH 1.550 09/14/2018    Therapeutic Level Labs: No results found for: LITHIUM No results found for: VALPROATE No components found for:  CBMZ  Current Medications: Current Outpatient Medications  Medication Sig Dispense Refill  . fluticasone (FLOVENT HFA) 220 MCG/ACT inhaler Swallow, two sprays in the morning and two sprays at nightime.    Marland Kitchen. acetaminophen (TYLENOL) 500  MG tablet Take 1,000 mg by mouth every 6 (six) hours as needed for moderate pain.    Marland Kitchen albuterol (PROVENTIL) (2.5 MG/3ML) 0.083% nebulizer solution Inhale 3 mLs (2.5 mg total) into the lungs every 6 (six) hours as needed for wheezing or shortness of breath. 75 mL 4  . ATROVENT HFA 17 MCG/ACT inhaler Inhale 2 puffs into the lungs every 6 (six) hours as needed for wheezing. 1 Inhaler 0  . BREO ELLIPTA 100-25 MCG/INH AEPB Inhale 1 puff into the lungs daily. 1 each 3  . clonazePAM (KLONOPIN) 1 MG tablet Take 0.5 tablets (0.5 mg total) by mouth as directed. Twice a day as needed( as discussed) 25 tablet 0  . COMBIVENT RESPIMAT 20-100 MCG/ACT AERS respimat Inhale 1 puff into the lungs every 6 (six) hours as needed for wheezing. 1 Inhaler 2  . EPIPEN 2-PAK 0.3 MG/0.3ML SOAJ injection 0.3 mg.     . escitalopram (LEXAPRO) 20 MG tablet Take 1 tablet (20 mg total) by mouth daily. 90 tablet 0  . fexofenadine (ALLEGRA) 180 MG tablet Take 180 mg by mouth daily as needed for allergies.     Marland Kitchen FLOVENT HFA 220 MCG/ACT inhaler     . fluticasone (FLONASE) 50 MCG/ACT nasal spray Place 1 spray into both nostrils daily.    Marland Kitchen omeprazole (PRILOSEC) 20 MG capsule Take 20  mg by mouth 2 (two) times daily before a meal.     . OXYGEN Inhale into the lungs. 2LITERS AT NIGHT AND WHEN SICK    . PROAIR HFA 108 (90 Base) MCG/ACT inhaler INAHLE 2 PUFFS BY MOUTH EVERY 4 TO 6 HOURS AS NEEDED 8.5 g 0  . propranolol (INDERAL) 10 MG tablet Take 1 tablet (10 mg total) by mouth 2 (two) times daily as needed. FOR SEVERE ANXIETY ATTACKS 60 tablet 1  . QUEtiapine (SEROQUEL) 50 MG tablet Take 1 tablet (50 mg total) by mouth at bedtime. MOOD 90 tablet 0   No current facility-administered medications for this visit.      Musculoskeletal: Strength & Muscle Tone: within normal limits Gait & Station: normal Patient leans: N/A  Psychiatric Specialty Exam: Review of Systems  Psychiatric/Behavioral: The patient is nervous/anxious.   All other systems reviewed and are negative.   There were no vitals taken for this visit.There is no height or weight on file to calculate BMI.  General Appearance: Casual  Eye Contact:  Fair  Speech:  Clear and Coherent  Volume:  Normal  Mood:  Anxious  Affect:  Appropriate  Thought Process:  Goal Directed and Descriptions of Associations: Intact  Orientation:  Full (Time, Place, and Person)  Thought Content: Logical   Suicidal Thoughts:  No  Homicidal Thoughts:  No  Memory:  Immediate;   Fair Recent;   Fair Remote;   Fair  Judgement:  Fair  Insight:  Fair  Psychomotor Activity:  Normal  Concentration:  Concentration: Fair and Attention Span: Fair  Recall:  [employer] of Knowledge: Fair  Language: Fair  Akathisia:  No  Handed:  Right  AIMS (if indicated): Denies tremors, rigidity, stiffness  Assets:  Communication Skills Desire for Improvement Social Support  ADL's:  Intact  Cognition: WNL  Sleep:  Fair   Screenings: PHQ2-9     Office Visit from 07/10/2018 in [facility] Office Visit from 01/18/2018 in [facility], [facility] PULMONARY REHAB OTHER RESP ORIENTATION from 02/09/2016 in [facility]  PHQ-2 Total Score  6  2  5  PHQ-9 Total Score  8  -  23       Assessment and Plan: Joziah is a 39 year old male who is married, on disability, lives in [place], has a history of depression, anxiety, pulmonary fibrosis, GERD, COPD, asthma, tachycardia was evaluated by telemedicine today.  Patient is biologically predisposed given his family history as well as multiple health issues.  Patient also has psychosocial stressors of current COVID-19 crisis, his own recent health problems.  Patient will benefit from continued medication management as well as more frequent psychotherapy sessions.  Plan MDD-improving Lexapro 20 mg p.o. daily  For GAD- some progress Lexapro as prescribed.  He reports he wants to stay on the Lexapro. Seroquel 50 mg p.o. nightly Propranolol 10 mg p.o. twice daily PRN for severe anxiety attacks  Panic attacks-improving Discussed with patient to continue to wean off Klonopin more. Discussed with patient that on week 1- he will not take any Klonopin on Friday. Week 2-he will add Friday and Monday when he does not take any Klonopin. Week 3- he will not take any Klonopin on Monday, Wednesday and Friday. Week 4- he will not take any Klonopin on Monday, Wednesday, Friday and Sunday. Discussed with patient to continue to take half tablet of Klonopin in the a.m. on other days.  I have reviewed medical records in [facility] per gastrenterologist Dr. Shari Prows 03/21/2019-' patient with eosinophilic esophagitis-start fluticasone propionate inhaler, swallow, 2 sprays in the morning and 2 sprays at nighttime.  Esophageal biopsies with focal mild increase in eosinophils.  This is not diagnostic however patient still having frequent esophageal dysphagia symptoms and recent food impaction.  We will treat her symptoms as eosinophilic esophagitis.'  Patient advised to restart psychotherapy sessions on a more frequent basis with Ms. Heidi Dach.  Follow-up in clinic in 3 to 4  weeks or sooner if needed.  July 8 at 4 PM.  I have spent atleast 25 minutes non face to face with patient today. More than 50 % of the time was spent for psychoeducation and supportive psychotherapy and care coordination.  This note was generated in part or whole with voice recognition software. Voice recognition is usually quite accurate but there are transcription errors that can and very often do occur. I apologize for any typographical errors that were not detected and corrected.          Jomarie Longs, MD 03/27/2019, 5:24 PM

## 2019-04-09 ENCOUNTER — Other Ambulatory Visit: Payer: Self-pay

## 2019-04-09 ENCOUNTER — Ambulatory Visit (INDEPENDENT_AMBULATORY_CARE_PROVIDER_SITE_OTHER): Payer: Medicare Other | Admitting: Internal Medicine

## 2019-04-09 ENCOUNTER — Encounter: Payer: Self-pay | Admitting: Internal Medicine

## 2019-04-09 VITALS — BP 121/96 | HR 86 | Resp 16 | Ht 67.0 in

## 2019-04-09 DIAGNOSIS — F411 Generalized anxiety disorder: Secondary | ICD-10-CM | POA: Diagnosis not present

## 2019-04-09 DIAGNOSIS — J449 Chronic obstructive pulmonary disease, unspecified: Secondary | ICD-10-CM

## 2019-04-09 DIAGNOSIS — Z9981 Dependence on supplemental oxygen: Secondary | ICD-10-CM | POA: Diagnosis not present

## 2019-04-09 DIAGNOSIS — J4489 Other specified chronic obstructive pulmonary disease: Secondary | ICD-10-CM

## 2019-04-09 MED ORDER — COMBIVENT RESPIMAT 20-100 MCG/ACT IN AERS: 2.0000 | INHALATION_SPRAY | RESPIRATORY_TRACT | 3 refills | Status: DC

## 2019-04-09 NOTE — Progress Notes (Signed)
[facility] [address] [place], [place] [address]  Internal MEDICINE  Telephone Visit  Patient Name: Steven PomfretRandy L [name]  [id number] 14, 1982  [id number]  Date of Service: 04/09/2019  I connected with the patient at  430 by telephone and verified the patients identity using two identifiers.   I discussed the limitations, risks, security and privacy concerns of performing an evaluation and management service by telephone and the availability of in person appointments. I also discussed with the patient that there may be a patient responsible charge related to the service.  The patient expressed understanding and agrees to proceed.    Chief Complaint  Patient presents with  . COPD    6 month follow up     HPI  PT is seen via video for 6 month follow up for COPD.  He continues to use oxygen at night on 2 lpm. He is using his inhalers as needed. Denies any current issues.         Current Medication: Outpatient Encounter Medications as of 04/09/2019  Medication Sig  . acetaminophen (TYLENOL) 500 MG tablet Take 1,000 mg by mouth every 6 (six) hours as needed for moderate pain.  Marland Kitchen. albuterol (PROVENTIL) (2.5 MG/3ML) 0.083% nebulizer solution Inhale 3 mLs (2.5 mg total) into the lungs every 6 (six) hours as needed for wheezing or shortness of breath.  . ATROVENT HFA 17 MCG/ACT inhaler Inhale 2 puffs into the lungs every 6 (six) hours as needed for wheezing.  Marland Kitchen. BREO ELLIPTA 100-25 MCG/INH AEPB Inhale 1 puff into the lungs daily.  . clonazePAM (KLONOPIN) 1 MG tablet Take 0.5 tablets (0.5 mg total) by mouth as directed. Twice a day as needed( as discussed) (Patient taking differently: Take 0.5 mg by mouth as directed. Monday and Friday skipped days)  . COMBIVENT RESPIMAT 20-100 MCG/ACT AERS respimat Inhale 2 puffs into the lungs every 4 (four) hours.  Marland Kitchen. EPIPEN 2-PAK 0.3 MG/0.3ML SOAJ injection 0.3 mg.   . escitalopram (LEXAPRO) 20 MG tablet Take 1 tablet (20 mg total) by mouth daily.  .  fexofenadine (ALLEGRA) 180 MG tablet Take 180 mg by mouth daily as needed for allergies.   Marland Kitchen. FLOVENT HFA 220 MCG/ACT inhaler   . fluticasone (FLONASE) 50 MCG/ACT nasal spray Place 1 spray into both nostrils daily.  . fluticasone (FLOVENT HFA) 220 MCG/ACT inhaler Swallow, two sprays in the morning and two sprays at nightime.  Marland Kitchen. omeprazole (PRILOSEC) 20 MG capsule Take 20 mg by mouth 2 (two) times daily before a meal.   . OXYGEN Inhale into the lungs. 2LITERS AT NIGHT AND WHEN SICK  . PROAIR HFA 108 (90 Base) MCG/ACT inhaler INAHLE 2 PUFFS BY MOUTH EVERY 4 TO 6 HOURS AS NEEDED  . propranolol (INDERAL) 10 MG tablet Take 1 tablet (10 mg total) by mouth 2 (two) times daily as needed. FOR SEVERE ANXIETY ATTACKS  . QUEtiapine (SEROQUEL) 50 MG tablet Take 1 tablet (50 mg total) by mouth at bedtime. MOOD  . [DISCONTINUED] COMBIVENT RESPIMAT 20-100 MCG/ACT AERS respimat Inhale 1 puff into the lungs every 6 (six) hours as needed for wheezing.   No facility-administered encounter medications on file as of 04/09/2019.     Surgical History: Past Surgical History:  Procedure Laterality Date  . ESOPHAGOGASTRODUODENOSCOPY N/A 02/18/2019   Procedure: ESOPHAGOGASTRODUODENOSCOPY (EGD);  Surgeon: Toledo, Boykin Nearingeodoro K, MD;  Location: [facility] ENDOSCOPY;  Service: Gastroenterology;  Laterality: N/A;  . ESOPHAGOGASTRODUODENOSCOPY (EGD) WITH PROPOFOL N/A 09/05/2017   Procedure: ESOPHAGOGASTRODUODENOSCOPY (EGD) WITH PROPOFOL;  Surgeon:  Toledo, Boykin Nearingeodoro K, MD;  Location: [facility] ENDOSCOPY;  Service: Gastroenterology;  Laterality: N/A;  . none    . TUBES IN EARS      Medical History: Past Medical History:  Diagnosis Date  . Anxiety   . Arthritis    knee  . Asthma   . Chronic mental illness   . COPD (chronic obstructive pulmonary disease) (HCC)   . Depression   . GERD (gastroesophageal reflux disease)   . Heart palpitations   . Panic attack   . Pulmonary fibrosis (HCC)     Family History: Family History  Problem  Relation Age of Onset  . COPD Mother   . Asthma Mother   . Mental illness Mother   . COPD Father   . Pulmonary fibrosis Father   . Lung cancer Father     Social History   Socioeconomic History  . Marital status: Married    Spouse name: Steven [name]  . Number of children: 4  . Years of education: Not on file  . Highest education level: Associate degree: occupational, Scientist, product/process developmenttechnical, or vocational program  Occupational History  . Not on file  Social Needs  . Financial resource strain: Not hard at all  . Food insecurity:    Worry: Never true    Inability: Never true  . Transportation needs:    Medical: No    Non-medical: No  Tobacco Use  . Smoking status: Former Smoker    Packs/day: 0.50    Years: 2.00    Pack years: 1.00    Types: Cigarettes    Last attempt to quit: 10/31/2000    Years since quitting: 18.4  . Smokeless tobacco: Current User    Types: Chew  . Tobacco comment: 2003  Substance and Sexual Activity  . Alcohol use: No    Alcohol/week: 0.0 standard drinks  . Drug use: No  . Sexual activity: Yes  Lifestyle  . Physical activity:    Days per week: 0 days    Minutes per session: 0 min  . Stress: Rather much  Relationships  . Social connections:    Talks on phone: Not on file    Gets together: Not on file    Attends religious service: Never    Active member of club or organization: No    Attends meetings of clubs or organizations: Never    Relationship status: Married  . Intimate partner violence:    Fear of current or ex partner: No    Emotionally abused: No    Physically abused: No    Forced sexual activity: No  Other Topics Concern  . Not on file  Social History Narrative  . Not on file      Review of Systems  Constitutional: Negative.  Negative for chills, fatigue and unexpected weight change.  HENT: Negative.  Negative for congestion, rhinorrhea, sneezing and sore throat.   Eyes: Negative for redness.  Respiratory: Negative.  Negative for cough,  chest tightness and shortness of breath.   Cardiovascular: Negative.  Negative for chest pain and palpitations.  Gastrointestinal: Negative.  Negative for abdominal pain, constipation, diarrhea, nausea and vomiting.  Endocrine: Negative.   Genitourinary: Negative.  Negative for dysuria and frequency.  Musculoskeletal: Negative.  Negative for arthralgias, back pain, joint swelling and neck pain.  Skin: Negative.  Negative for rash.  Allergic/Immunologic: Negative.   Neurological: Negative.  Negative for tremors and numbness.  Hematological: Negative for adenopathy. Does not bruise/bleed easily.  Psychiatric/Behavioral: Negative.  Negative for behavioral problems,  sleep disturbance and suicidal ideas. The patient is not nervous/anxious.     Vital Signs: BP (!) 121/96   Pulse 86   Resp 16   Ht 5\' 7"  (1.702 m)   SpO2 95%   BMI 39.78 kg/m    Observation/Objective:  Speaking in full sentences.  NAD noted at this time.    Assessment/Plan: 1. Obstructive chronic bronchitis without exacerbation ([place]) Stable at this time.  Continue present management.   2. Dependence on nocturnal oxygen therapy Continue to use oxygen at night time as prescribed.   3. GAD (generalized anxiety disorder) Stable, continue present medication thearpy.  4. Morbid obesity ([place]) Obesity Counseling: Risk Assessment: An assessment of behavioral risk factors was made today and includes lack of exercise sedentary lifestyle, lack of portion control and poor dietary habits.  Risk Modification Advice: She was counseled on portion control guidelines. Restricting daily caloric intake to. . The detrimental long term effects of obesity on her health and ongoing poor compliance was also discussed with the patient.    General Counseling: treshon stannard understanding of the findings of today's phone visit and agrees with plan of treatment. I have discussed any further diagnostic evaluation that may be needed or  ordered today. We also reviewed his medications today. he has been encouraged to call the office with any questions or concerns that should arise related to todays visit.    No orders of the defined types were placed in this encounter.   Meds ordered this encounter  Medications  . COMBIVENT RESPIMAT 20-100 MCG/ACT AERS respimat    Sig: Inhale 2 puffs into the lungs every 4 (four) hours.    Dispense:  2 Inhaler    Refill:  3    Time spent: 11 Minutes   Orson Gear AGNP-C Pulmonary medicine

## 2019-04-11 ENCOUNTER — Other Ambulatory Visit: Payer: Self-pay

## 2019-04-11 ENCOUNTER — Ambulatory Visit (INDEPENDENT_AMBULATORY_CARE_PROVIDER_SITE_OTHER): Payer: Medicare Other | Admitting: Licensed Clinical Social Worker

## 2019-04-11 ENCOUNTER — Encounter: Payer: Self-pay | Admitting: Licensed Clinical Social Worker

## 2019-04-11 DIAGNOSIS — F410 Panic disorder [episodic paroxysmal anxiety] without agoraphobia: Secondary | ICD-10-CM | POA: Diagnosis not present

## 2019-04-11 DIAGNOSIS — F330 Major depressive disorder, recurrent, mild: Secondary | ICD-10-CM

## 2019-04-11 DIAGNOSIS — F411 Generalized anxiety disorder: Secondary | ICD-10-CM | POA: Diagnosis not present

## 2019-04-11 NOTE — Progress Notes (Signed)
Virtual Visit via Video Note  I connected with Steven [name] on 04/11/19 at  1:30 PM EDT by a video enabled telemedicine application and verified that I am speaking with the correct person using two identifiers.   I discussed the limitations of evaluation and management by telemedicine and the availability of in person appointments. The patient expressed understanding and agreed to proceed.  I discussed the assessment and treatment plan with the patient. The patient was provided an opportunity to ask questions and all were answered. The patient agreed with the plan and demonstrated an understanding of the instructions.   The patient was advised to call back or seek an in-person evaluation if the symptoms worsen or if the condition fails to improve as anticipated.  I provided 30 minutes of non-face-to-face time during this encounter.   Steven Hipp, LCSW    THERAPIST PROGRESS NOTE  Session Time: 1330  Participation Level: Minimal  Behavioral Response: NAAlertAnxious  Type of Therapy: Individual Therapy  Treatment Goals addressed: Anxiety  Interventions: Supportive  Summary: Steven [name] is a 38 y.o. male who presents with continued symptoms related to his diagnosis. Steven [name] reports doing well since our last session. He reports he has remained in his home and is getting along well with his family. He reports, "it's doing the same shit every day, but I'm getting used to it." LCSW highlighted the ways quarantine could be contributing to the sense of monotony in Steven [name]'s daily life. Steven [name] was able to recognize this as well and expressed understanding. He reports he has not had any panic attacks since our last session, "I mean, I get on the verge of one but I don't have it." LCSW validated that progress and encouraged Steven [name] to continue practicing grounding techniques when he is feeling panic approach. Steven [name] expressed understanding and agreement with this idea as well. Steven [name] reported he  has decreased his benzo intake to one x per day, and noted he was on 3x daily previously. He reported decreasing the dosage has been difficult and he has noticed more irritability recently, but stated he is committed to continuing to taper off the medication. LCSW validated this frustration as well. Steven [name] reports a month ago, he ended up having to have surgery after choking on a piece of chicken. He has sense been diagnosed with a esophagus issue, and is now on daily medication for it. He reports no other issues have come up since our last session, however.   Suicidal/Homicidal: No  Therapist Response: Steven [name] continues to work towards his tx goals but has not yet reached them. He reports no panic attacks which demonstrates progress, but he reports continued anxiety and moments of panic. We will continue to work on CBT and DBT skills moving forward.   Plan: Return again in 4 weeks.  Diagnosis: Axis I: Generalized Anxiety Disorder    Axis II: No diagnosis    Steven Hipp, LCSW 04/11/2019

## 2019-04-29 ENCOUNTER — Emergency Department (HOSPITAL_COMMUNITY): Payer: Medicare Other

## 2019-04-29 ENCOUNTER — Encounter (HOSPITAL_COMMUNITY): Payer: Self-pay | Admitting: Emergency Medicine

## 2019-04-29 ENCOUNTER — Emergency Department (HOSPITAL_COMMUNITY)
Admission: EM | Admit: 2019-04-29 | Discharge: 2019-04-29 | Disposition: A | Discharge status: Home / Self Care | Payer: Medicare Other | Attending: Emergency Medicine | Admitting: Emergency Medicine

## 2019-04-29 ENCOUNTER — Other Ambulatory Visit: Payer: Self-pay

## 2019-04-29 DIAGNOSIS — W25XXXA Contact with sharp glass, initial encounter: Secondary | ICD-10-CM | POA: Insufficient documentation

## 2019-04-29 DIAGNOSIS — Y92099 Unspecified place in other non-institutional residence as the place of occurrence of the external cause: Secondary | ICD-10-CM | POA: Insufficient documentation

## 2019-04-29 DIAGNOSIS — S61216A Laceration without foreign body of right little finger without damage to nail, initial encounter: Secondary | ICD-10-CM | POA: Diagnosis not present

## 2019-04-29 DIAGNOSIS — J449 Chronic obstructive pulmonary disease, unspecified: Secondary | ICD-10-CM | POA: Diagnosis not present

## 2019-04-29 DIAGNOSIS — Y998 Other external cause status: Secondary | ICD-10-CM | POA: Insufficient documentation

## 2019-04-29 DIAGNOSIS — S61419A Laceration without foreign body of unspecified hand, initial encounter: Secondary | ICD-10-CM

## 2019-04-29 DIAGNOSIS — Z79899 Other long term (current) drug therapy: Secondary | ICD-10-CM | POA: Diagnosis not present

## 2019-04-29 DIAGNOSIS — S61214A Laceration without foreign body of right ring finger without damage to nail, initial encounter: Secondary | ICD-10-CM | POA: Diagnosis not present

## 2019-04-29 DIAGNOSIS — S60511A Abrasion of right hand, initial encounter: Secondary | ICD-10-CM | POA: Diagnosis not present

## 2019-04-29 DIAGNOSIS — S61212A Laceration without foreign body of right middle finger without damage to nail, initial encounter: Secondary | ICD-10-CM | POA: Diagnosis not present

## 2019-04-29 DIAGNOSIS — Z87891 Personal history of nicotine dependence: Secondary | ICD-10-CM | POA: Insufficient documentation

## 2019-04-29 DIAGNOSIS — E041 Nontoxic single thyroid nodule: Secondary | ICD-10-CM | POA: Insufficient documentation

## 2019-04-29 DIAGNOSIS — S61511A Laceration without foreign body of right wrist, initial encounter: Secondary | ICD-10-CM | POA: Diagnosis not present

## 2019-04-29 DIAGNOSIS — Y9389 Activity, other specified: Secondary | ICD-10-CM | POA: Insufficient documentation

## 2019-04-29 LAB — CBC WITH DIFFERENTIAL/PLATELET
Abs Immature Granulocytes: 0.05 10*3/uL (ref 0.00–0.07)
Basophils Absolute: 0 10*3/uL (ref 0.0–0.1)
Basophils Relative: 0 %
Eosinophils Absolute: 0.1 10*3/uL (ref 0.0–0.5)
Eosinophils Relative: 1 %
HCT: 39.8 % (ref 39.0–52.0)
Hemoglobin: 13.2 g/dL (ref 13.0–17.0)
Immature Granulocytes: 1 %
Lymphocytes Relative: 13 %
Lymphs Abs: 1.4 10*3/uL (ref 0.7–4.0)
MCH: 29.5 pg (ref 26.0–34.0)
MCHC: 33.2 g/dL (ref 30.0–36.0)
MCV: 89 fL (ref 80.0–100.0)
Monocytes Absolute: 0.4 10*3/uL (ref 0.1–1.0)
Monocytes Relative: 3 %
Neutro Abs: 9.2 10*3/uL — ABNORMAL HIGH (ref 1.7–7.7)
Neutrophils Relative %: 82 %
Platelets: 347 10*3/uL (ref 150–400)
RBC: 4.47 MIL/uL (ref 4.22–5.81)
RDW: 12.1 % (ref 11.5–15.5)
WBC: 11.1 10*3/uL — ABNORMAL HIGH (ref 4.0–10.5)
nRBC: 0 % (ref 0.0–0.2)

## 2019-04-29 LAB — BASIC METABOLIC PANEL
Anion gap: 7 (ref 5–15)
BUN: 8 mg/dL (ref 6–20)
CO2: 25 mmol/L (ref 22–32)
Calcium: 8.8 mg/dL — ABNORMAL LOW (ref 8.9–10.3)
Chloride: 105 mmol/L (ref 98–111)
Creatinine, Ser: 0.91 mg/dL (ref 0.61–1.24)
GFR calc Af Amer: >60 mL/min (ref >60)
GFR calc non Af Amer: >60 mL/min (ref >60)
Glucose, Bld: 148 mg/dL — ABNORMAL HIGH (ref 70–99)
Potassium: 4.1 mmol/L (ref 3.5–5.1)
Sodium: 137 mmol/L (ref 135–145)

## 2019-04-29 MED ORDER — POVIDONE-IODINE 10 % EX SOLN
CUTANEOUS | Status: AC
  Administered 2019-04-29: 20:00:00
  Filled 2019-04-29: qty 15

## 2019-04-29 MED ORDER — HYDROCODONE-ACETAMINOPHEN 5-325 MG PO TABS
1.0000 | ORAL_TABLET | Freq: Once | ORAL | Status: AC
  Administered 2019-04-29: 1 via ORAL
  Filled 2019-04-29: qty 1

## 2019-04-29 MED ORDER — LIDOCAINE-EPINEPHRINE (PF) 2 %-1:200000 IJ SOLN
10.0000 mL | Freq: Once | INTRAMUSCULAR | Status: AC
  Administered 2019-04-29: 10 mL via INTRADERMAL

## 2019-04-29 MED ORDER — LIDOCAINE-EPINEPHRINE (PF) 2 %-1:200000 IJ SOLN
INTRAMUSCULAR | Status: AC
  Filled 2019-04-29: qty 20

## 2019-04-29 MED ORDER — LIDOCAINE-EPINEPHRINE (PF) 2 %-1:200000 IJ SOLN
INTRAMUSCULAR | Status: AC
  Administered 2019-04-29: 10 mL via INTRADERMAL
  Filled 2019-04-29: qty 20

## 2019-04-29 NOTE — Discharge Instructions (Signed)
Keep your right hand elevated above your heart for at least 24 hours.  Remove the bandages and clean the wounds with soap and water, once or twice a day then apply bacitracin and more dressings.  Return here if you are concerned about bleeding, or have other concerns.  The sutures can be removed in 10 days.

## 2019-04-29 NOTE — ED Triage Notes (Signed)
Pt tripped and his right hand went through a window. Bleeding in head and wrist.

## 2019-04-29 NOTE — ED Provider Notes (Signed)
[facility] EMERGENCY DEPARTMENT Provider Note   CSN: [id number] Arrival date & time: 04/29/19  1730    History   Chief Complaint Chief Complaint  Patient presents with   Laceration    HPI Steven [name] is a 38 y.o. male.     HPI   He states that he was at home, working on his deck, carrying a board when he tripped and fell against glass cutting his right arm and right fifth finger.  He states there was a lot of bleeding, after the incident.  He states his last tetanus booster was 2 years ago when he had another laceration.  He denies prodrome prior to the injury.  He denies recent illnesses.  He does not take any medications that would thin the blood.  He is not currently working.  There are no other known modifying factors.  Past Medical History:  Diagnosis Date   Anxiety    Arthritis    knee   Asthma    Chronic mental illness    COPD (chronic obstructive pulmonary disease) (HCC)    Depression    GERD (gastroesophageal reflux disease)    Heart palpitations    Panic attack    Pulmonary fibrosis [facility])     Patient Active Problem List   Diagnosis Date Noted   Gastroesophageal reflux disease without esophagitis 02/18/2019   PVC's (premature ventricular contractions) 01/31/2019   Allergy to meat 10/11/2018   Chronic left upper quadrant pain 10/11/2018   Functional dyspepsia 10/11/2018   Heart palpitations 05/31/2018   Anxiety, generalized 05/31/2018   Asthma, chronic 04/05/2015   Chronic obstructive airway disease with asthma ([place]) 03/17/2015   Obesity 03/17/2015   DOE (dyspnea on exertion) 03/17/2015   Environmental allergies 04/03/2014   Chest pain 08/02/2013   Thyroid nodule 03/21/2013    Past Surgical History:  Procedure Laterality Date   ESOPHAGOGASTRODUODENOSCOPY N/A 02/18/2019   Procedure: ESOPHAGOGASTRODUODENOSCOPY (EGD);  Surgeon: Toledo, Benay Pike, MD;  Location: [facility] ENDOSCOPY;  Service: Gastroenterology;  Laterality:  N/A;   ESOPHAGOGASTRODUODENOSCOPY (EGD) WITH PROPOFOL N/A 09/05/2017   Procedure: ESOPHAGOGASTRODUODENOSCOPY (EGD) WITH PROPOFOL;  Surgeon: Toledo, Benay Pike, MD;  Location: [facility] ENDOSCOPY;  Service: Gastroenterology;  Laterality: N/A;   none     TUBES IN EARS          Home Medications    Prior to Admission medications   Medication Sig Start Date End Date Taking? Authorizing Provider  acetaminophen (TYLENOL) 500 MG tablet Take 1,000 mg by mouth every 6 (six) hours as needed for moderate pain.    [provider]  albuterol (PROVENTIL) (2.5 MG/3ML) 0.083% nebulizer solution Inhale 3 mLs (2.5 mg total) into the lungs every 6 (six) hours as needed for wheezing or shortness of breath. 07/10/18   Kendell Bane, NP  ATROVENT HFA 17 MCG/ACT inhaler Inhale 2 puffs into the lungs every 6 (six) hours as needed for wheezing. 07/10/18   Scarboro, Audie Clear, NP  BREO ELLIPTA 100-25 MCG/INH AEPB Inhale 1 puff into the lungs daily. 07/10/18   Kendell Bane, NP  clonazePAM (KLONOPIN) 1 MG tablet Take 0.5 tablets (0.5 mg total) by mouth as directed. Twice a day as needed( as discussed) Patient taking differently: Take 0.5 mg by mouth as directed. Monday and Friday skipped days 03/04/19   Ursula Alert, MD  COMBIVENT RESPIMAT 20-100 MCG/ACT AERS respimat Inhale 2 puffs into the lungs every 4 (four) hours. 04/09/19   Kendell Bane, NP  EPIPEN 2-PAK 0.3 MG/0.3ML Darden Palmer  injection 0.3 mg.  12/03/15   [provider]  escitalopram (LEXAPRO) 20 MG tablet Take 1 tablet (20 mg total) by mouth daily. 03/27/19   Jomarie LongsEappen, Saramma, MD  fexofenadine (ALLEGRA) 180 MG tablet Take 180 mg by mouth daily as needed for allergies.     [provider]  FLOVENT HFA 220 MCG/ACT inhaler  03/21/19   [provider]  fluticasone (FLONASE) 50 MCG/ACT nasal spray Place 1 spray into both nostrils daily. 06/23/15   [provider]  fluticasone (FLOVENT HFA) 220 MCG/ACT inhaler Swallow, two sprays in  the morning and two sprays at nightime. 03/21/19   [provider]  omeprazole (PRILOSEC) 20 MG capsule Take 20 mg by mouth 2 (two) times daily before a meal.     [provider]  OXYGEN Inhale into the lungs. 2LITERS AT NIGHT AND WHEN SICK    [provider]  PROAIR HFA 108 (90 Base) MCG/ACT inhaler INAHLE 2 PUFFS BY MOUTH EVERY 4 TO 6 HOURS AS NEEDED 09/15/16   Mungal, Vishal, MD  propranolol (INDERAL) 10 MG tablet Take 1 tablet (10 mg total) by mouth 2 (two) times daily as needed. FOR SEVERE ANXIETY ATTACKS 03/04/19   Jomarie LongsEappen, Saramma, MD  QUEtiapine (SEROQUEL) 50 MG tablet Take 1 tablet (50 mg total) by mouth at bedtime. MOOD 03/27/19   Jomarie LongsEappen, Saramma, MD    Family History Family History  Problem Relation Age of Onset   COPD Mother    Asthma Mother    Mental illness Mother    COPD Father    Pulmonary fibrosis Father    Lung cancer Father     Social History Social History   Tobacco Use   Smoking status: Former Smoker    Packs/day: 0.50    Years: 2.00    Pack years: 1.00    Types: Cigarettes    Quit date: 10/31/2000    Years since quitting: 18.5   Smokeless tobacco: Current User    Types: Chew   Tobacco comment: 2003  Substance Use Topics   Alcohol use: No    Alcohol/week: 0.0 standard drinks   Drug use: No     Allergies   Diphenhydramine, Guaifenesin, Doxycycline, Pork-derived products, and Amoxicillin   Review of Systems Review of Systems  All other systems reviewed and are negative.    Physical Exam Updated Vital Signs BP 112/74    Pulse 85    Temp 98.3 F (36.8 C)    Resp 18    SpO2 98%   Physical Exam Vitals signs and nursing note reviewed.  Constitutional:      General: He is not in acute distress.    Appearance: He is well-developed. He is obese. He is not ill-appearing, toxic-appearing or diaphoretic.  HENT:     Head: Normocephalic and atraumatic.     Right Ear: External ear normal.     Left Ear: External ear  normal.  Eyes:     Conjunctiva/sclera: Conjunctivae normal.     Pupils: Pupils are equal, round, and reactive to light.  Neck:     Musculoskeletal: Normal range of motion and neck supple.     Trachea: Phonation normal.  Cardiovascular:     Rate and Rhythm: Normal rate.  Pulmonary:     Effort: Pulmonary effort is normal.  Musculoskeletal:     Comments: Laceration right wrist, volar aspect, over the distal ulna.  Laceration is irregular, with swelling beneath it, nonspecific nature.  Also laceration right fifth finger ulnar aspect.  He is neurovascular intact distally in all fingers of the right hand.  There is normal perfusion to each of the fingers of the right hand.  Skin:    General: Skin is warm and dry.  Neurological:     Mental Status: He is alert and oriented to person, place, and time.     Cranial Nerves: No cranial nerve deficit.     Sensory: No sensory deficit.     Motor: No abnormal muscle tone.     Coordination: Coordination normal.  Psychiatric:        Behavior: Behavior normal.        Thought Content: Thought content normal.        Judgment: Judgment normal.      ED Treatments / Results  Labs (all labs ordered are listed, but only abnormal results are displayed) Labs Reviewed  BASIC METABOLIC PANEL - Abnormal; Notable for the following components:      Result Value   Glucose, Bld 148 (*)    Calcium 8.8 (*)    All other components within normal limits  CBC WITH DIFFERENTIAL/PLATELET - Abnormal; Notable for the following components:   WBC 11.1 (*)    Neutro Abs 9.2 (*)    All other components within normal limits    EKG None  Radiology Dg Wrist Complete Right  Result Date: 04/29/2019 CLINICAL DATA:  Multiple lacerations. EXAM: RIGHT WRIST - COMPLETE 3+ VIEW COMPARISON:  None. FINDINGS: The bones are normal. No radiodense foreign bodies in the soft tissues. Soft tissue lacerations. IMPRESSION: Lacerations.  No acute bone abnormality. Electronically Signed    By: Francene BoyersJames  Maxwell M.D.   On: 04/29/2019 20:46   Dg Hand 2 View Right  Result Date: 04/29/2019 CLINICAL DATA:  Multiple lacerations. EXAM: RIGHT HAND - 2 VIEW COMPARISON:  Radiographs dated 04/07/2007 FINDINGS: There is no fracture or dislocation. Soft tissue lacerations. No visible radiodense foreign body in the soft tissues. IMPRESSION: Lacerations.  Normal bones.  No foreign bodies. Electronically Signed   By: Francene BoyersJames  Maxwell M.D.   On: 04/29/2019 20:45    Procedures .Marland Kitchen.Laceration Repair  Date/Time: 04/29/2019 10:57 PM Performed by: Mancel BaleWentz, Aleina Burgio, MD Authorized by: Mancel BaleWentz, Michie Molnar, MD   Consent:    Consent obtained:  Verbal   Consent given by:  Patient   Risks discussed:  Pain   Alternatives discussed:  No treatment Anesthesia (see MAR for exact dosages):    Anesthesia method:  Local infiltration   Local anesthetic:  Lidocaine 2% WITH epi Laceration details:    Location:  Finger   Finger location:  R long finger   Length (cm):  2   Depth (mm):  9 Repair type:    Repair type:  Simple Exploration:    Hemostasis achieved with:  Direct pressure   Wound exploration: wound explored through full range of motion     Wound extent: no fascia violation noted, no foreign bodies/material noted and no muscle damage noted     Contaminated: no   Treatment:    Area cleansed with:  Betadine   Amount of cleaning:  Standard   Irrigation solution:  Sterile saline   Visualized foreign bodies/material removed: no   Skin repair:    Repair method:  Sutures   Suture size:  3-0   Suture material:  Prolene   Suture technique:  Simple interrupted   Number of sutures:  2 Approximation:    Approximation:  Loose Post-procedure details:    Dressing:  Antibiotic ointment and sterile dressing  Patient tolerance of procedure:  Tolerated well, no immediate complications .Marland Kitchen.Laceration Repair  Date/Time: 04/29/2019 10:59 PM Performed by: Mancel BaleWentz, Momen Ham, MD Authorized by: Mancel BaleWentz, Fleming Prill, MD   Consent:     Consent obtained:  Verbal   Consent given by:  Patient   Risks discussed:  Pain and poor wound healing   Alternatives discussed:  No treatment Anesthesia (see MAR for exact dosages):    Anesthesia method:  Local infiltration   Local anesthetic:  Lidocaine 2% WITH epi Laceration details:    Location:  Finger   Finger location:  R ring finger   Length (cm):  3   Depth (mm):  9 Repair type:    Repair type:  Simple Exploration:    Hemostasis achieved with:  Direct pressure   Wound exploration: wound explored through full range of motion     Wound extent: no fascia violation noted, no foreign bodies/material noted and no muscle damage noted     Contaminated: no   Treatment:    Area cleansed with:  Betadine   Amount of cleaning:  Standard   Irrigation solution:  Sterile saline   Visualized foreign bodies/material removed: no   Skin repair:    Repair method:  Sutures   Suture size:  3-0   Suture material:  Prolene   Suture technique:  Simple interrupted   Number of sutures:  3 Approximation:    Approximation:  Close Post-procedure details:    Dressing:  Sterile dressing and antibiotic ointment   Patient tolerance of procedure:  Tolerated well, no immediate complications .Marland Kitchen.Laceration Repair  Date/Time: 04/29/2019 11:04 PM Performed by: Mancel BaleWentz, Rockwell Zentz, MD Authorized by: Mancel BaleWentz, Jakala Herford, MD   Consent:    Consent obtained:  Verbal   Consent given by:  Patient   Risks discussed:  Pain   Alternatives discussed:  No treatment Anesthesia (see MAR for exact dosages):    Anesthesia method:  Local infiltration   Local anesthetic:  Lidocaine 2% WITH epi Laceration details:    Location:  Finger   Finger location:  R small finger   Length (cm):  3.5   Depth (mm):  9 Repair type:    Repair type:  Simple Pre-procedure details:    Preparation:  Patient was prepped and draped in usual sterile fashion and imaging obtained to evaluate for foreign bodies Exploration:    Hemostasis  achieved with:  Direct pressure   Wound extent: no areolar tissue violation noted, no fascia violation noted, no foreign bodies/material noted, no muscle damage noted, no tendon damage noted and no underlying fracture noted     Contaminated: no   Treatment:    Area cleansed with:  Betadine   Amount of cleaning:  Standard   Irrigation solution:  Sterile saline   Visualized foreign bodies/material removed: no   Skin repair:    Repair method:  Sutures   Suture size:  3-0   Suture material:  Prolene   Number of sutures:  5 Approximation:    Approximation:  Loose Post-procedure details:    Dressing:  Antibiotic ointment and sterile dressing   Patient tolerance of procedure:  Tolerated well, no immediate complications .Marland Kitchen.Laceration Repair  Date/Time: 04/29/2019 11:06 PM Performed by: Mancel BaleWentz, Gwendolen Hewlett, MD Authorized by: Mancel BaleWentz, Glenette Bookwalter, MD   Consent:    Consent obtained:  Verbal   Consent given by:  Patient   Risks discussed:  Pain and poor wound healing   Alternatives discussed:  No treatment Anesthesia (see MAR for exact dosages):    Anesthesia  method:  Local infiltration   Local anesthetic:  Lidocaine 2% WITH epi Laceration details:    Location: right forearm, volar/ulnar.   Length (cm):  4.5   Depth (mm):  15 Repair type:    Repair type:  Complex Pre-procedure details:    Preparation:  Patient was prepped and draped in usual sterile fashion and imaging obtained to evaluate for foreign bodies Exploration:    Limited defect created (wound extended): no     Hemostasis achieved with:  Epinephrine, tourniquet, tied off vessels and direct pressure   Wound exploration: wound explored through full range of motion and entire depth of wound probed and visualized     Wound extent: fascia violated, muscle damage and vascular damage     Wound extent: no foreign bodies/material noted, no tendon damage noted and no underlying fracture noted     Contaminated: no   Treatment:    Area cleansed  with:  Betadine   Amount of cleaning:  Extensive   Irrigation solution:  Sterile saline   Visualized foreign bodies/material removed: no     Debridement:  None   Undermining:  None   Scar revision: no   Subcutaneous repair:    Suture size:  3-0   Suture material:  Vicryl   Suture technique:  Vertical mattress   Number of sutures:  5 Skin repair:    Repair method:  Sutures   Suture size:  3-0   Suture material:  Prolene   Suture technique:  Simple interrupted   Number of sutures:  5 Approximation:    Approximation:  Close Post-procedure details:    Dressing:  Antibiotic ointment and sterile dressing   Patient tolerance of procedure:  Tolerated well, no immediate complications Comments:     Complex, deep wound, into muscle bundles of the distal volar forearm over the ulna bone.  He is neurovascularly and functionally intact distally.  During wound repair, bleeding attempted to be controlled by suturing apparent venous bleeders with pursestrings.  The pursestrings did not take to control the bleeding.  Therefore deep subcutaneous vertical mattress stitches were done to control bleeding.  This was done under tourniquet control, with good control of the bleeding.  Skin was then sutured over the deep closure.  A compression bandage was placed and he was observed for 2 hours.  During this time CBC was checked, and he was reassessed at 10:55 PM and the dressing was dry.  Patient continues to have function and circulation and sensation in the fingers of the right hand.  He was given wound care instructions by me. .Critical Care Performed by: Mancel Bale, MD Authorized by: Mancel Bale, MD   Critical care provider statement:    Critical care time (minutes):  35   Critical care start time:  04/29/2019 5:40 PM   Critical care end time:  04/29/2019 11:15 PM   Critical care time was exclusive of:  Separately billable procedures and treating other patients   Critical care was necessary to treat  or prevent imminent or life-threatening deterioration of the following conditions:  Circulatory failure   Critical care was time spent personally by me on the following activities:  Blood draw for specimens, development of treatment plan with patient or surrogate, discussions with consultants, evaluation of patient's response to treatment, examination of patient, obtaining history from patient or surrogate, ordering and performing treatments and interventions, ordering and review of laboratory studies, pulse oximetry, re-evaluation of patient's condition, review of old charts and ordering and review of  radiographic studies   (including critical care time)  Medications Ordered in ED Medications  povidone-iodine (BETADINE) 10 % external solution (has no administration in time range)  lidocaine-EPINEPHrine (XYLOCAINE W/EPI) 2 %-1:200000 (PF) injection 10 mL (10 mLs Intradermal Given by Other 04/29/19 2015)     Initial Impression / Assessment and Plan / ED Course  I have reviewed the triage vital signs and the nursing notes.  Pertinent labs & imaging results that were available during my care of the patient were reviewed by me and considered in my medical decision making (see chart for details).         Patient Vitals for the past 24 hrs:  BP Temp Pulse Resp SpO2  04/29/19 2230 112/74 -- 85 18 98 %  04/29/19 2200 -- -- 82 -- --  04/29/19 2030 107/75 -- (!) 114 19 99 %  04/29/19 2003 133/80 -- 93 18 100 %  04/29/19 1955 (!) 118/97 -- 76 20 98 %  04/29/19 1945 107/71 -- (!) 122 (!) 21 99 %  04/29/19 1925 (!) 141/87 -- (!) 117 (!) 22 98 %  04/29/19 1735 (!) 135/100 98.3 F (36.8 C) (!) 116 19 99 %    11:00 PM Reevaluation with update and discussion. After initial assessment and treatment, an updated evaluation reveals he is comfortable, wounds are not bleeding.  Findings discussed and questions answered. Mancel Bale   Medical Decision Making: Right wrist and right fingers laceration,  multiple, with abrasions of the right hand not requiring suture.  Right forearm wound is moderately deep, irregular, and bleeding requiring subcutaneous closure to control bleeding.  Patient remained hemodynamically stable, CBC was checked and hemoglobin is normal.  He was observed for an extended period of time and the right forearm wound did not rebleed.  He had no significant swelling at the time of discharge.  CRITICAL CARE-yes Performed by: Mancel Bale  Nursing Notes Reviewed/ Care Coordinated Applicable Imaging Reviewed Interpretation of Laboratory Data incorporated into ED treatment  The patient appears reasonably screened and/or stabilized for discharge and I doubt any other medical condition or other [facility] requiring further screening, evaluation, or treatment in the ED at this time prior to discharge.  Plan: Home Medications-OTC analgesia of choice; Home Treatments-wound care at home, suture removal 10 days; return here if the recommended treatment, does not improve the symptoms; Recommended follow up-PCP or return here as needed    Final Clinical Impressions(s) / ED Diagnoses   Final diagnoses:  Laceration of right wrist, initial encounter  Laceration of right little finger without foreign body without damage to nail, initial encounter  Laceration of right ring finger without foreign body without damage to nail, initial encounter  Laceration of right middle finger without foreign body without damage to nail, initial encounter  Abrasion of right hand, initial encounter    ED Discharge Orders    None       Mancel Bale, MD 04/29/19 2315

## 2019-04-29 NOTE — ED Notes (Signed)
ABD pad and kerlix placed over sutures on pt right anterior wrist.

## 2019-04-29 NOTE — ED Notes (Signed)
Pt given two packs of crackers and beverage prior to pain medication.

## 2019-04-29 NOTE — ED Notes (Signed)
xays are complete.

## 2019-04-29 NOTE — ED Notes (Signed)
tourniquet machine placed on pt for sustained pressure to RUE while Dr Eulis Foster places sutures to right wrist, hand, and fingers. Constant monitoring by this nurse while machine in use. Removed at 2001. Pt is able to feel and move RUE, hand, and fingers. Color is within normal limits. Cap refill <3 seconds.

## 2019-05-08 ENCOUNTER — Encounter: Payer: Self-pay | Admitting: Licensed Clinical Social Worker

## 2019-05-08 ENCOUNTER — Ambulatory Visit (INDEPENDENT_AMBULATORY_CARE_PROVIDER_SITE_OTHER): Payer: Medicare Other | Admitting: Psychiatry

## 2019-05-08 ENCOUNTER — Encounter: Payer: Self-pay | Admitting: Psychiatry

## 2019-05-08 ENCOUNTER — Other Ambulatory Visit: Payer: Self-pay

## 2019-05-08 ENCOUNTER — Ambulatory Visit (INDEPENDENT_AMBULATORY_CARE_PROVIDER_SITE_OTHER): Payer: Medicare Other | Admitting: Licensed Clinical Social Worker

## 2019-05-08 DIAGNOSIS — F410 Panic disorder [episodic paroxysmal anxiety] without agoraphobia: Secondary | ICD-10-CM | POA: Diagnosis not present

## 2019-05-08 DIAGNOSIS — F330 Major depressive disorder, recurrent, mild: Secondary | ICD-10-CM

## 2019-05-08 DIAGNOSIS — F411 Generalized anxiety disorder: Secondary | ICD-10-CM | POA: Insufficient documentation

## 2019-05-08 DIAGNOSIS — F6381 Intermittent explosive disorder: Secondary | ICD-10-CM | POA: Insufficient documentation

## 2019-05-08 MED ORDER — DIVALPROEX SODIUM 125 MG PO DR TAB: 125.0000 mg | DELAYED_RELEASE_TABLET | Freq: Three times a day (TID) | ORAL | 1 refills | Status: DC

## 2019-05-08 MED ORDER — ESCITALOPRAM OXALATE 20 MG PO TABS: 20.0000 mg | ORAL_TABLET | Freq: Every day | ORAL | 0 refills | Status: DC

## 2019-05-08 MED ORDER — CLONAZEPAM 0.5 MG PO TABS: 0.5000 mg | ORAL_TABLET | ORAL | 0 refills | Status: DC
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
## 2019-05-20 DIAGNOSIS — S61502D Unspecified open wound of left wrist, subsequent encounter: Secondary | ICD-10-CM | POA: Insufficient documentation

## 2019-06-06 ENCOUNTER — Encounter: Payer: Self-pay | Admitting: Licensed Clinical Social Worker

## 2019-06-06 ENCOUNTER — Ambulatory Visit (INDEPENDENT_AMBULATORY_CARE_PROVIDER_SITE_OTHER): Payer: Medicare Other | Admitting: Licensed Clinical Social Worker

## 2019-06-06 ENCOUNTER — Telehealth: Payer: Self-pay

## 2019-06-06 ENCOUNTER — Other Ambulatory Visit: Payer: Self-pay

## 2019-06-06 DIAGNOSIS — F411 Generalized anxiety disorder: Secondary | ICD-10-CM | POA: Diagnosis not present

## 2019-06-06 DIAGNOSIS — F410 Panic disorder [episodic paroxysmal anxiety] without agoraphobia: Secondary | ICD-10-CM

## 2019-06-06 MED ORDER — CLONAZEPAM 0.5 MG PO TABS: 0.5000 mg | ORAL_TABLET | ORAL | 0 refills | Status: DC

## 2019-06-06 NOTE — Telephone Encounter (Signed)
Patient called and stated that he has a lot of issues going on in his life right now and he stated he really needs a refill on his Clonazepam 0.5mg . It was last sent to [facility] on [address]. Please review and advise. Thank you.

## 2019-06-06 NOTE — Telephone Encounter (Signed)
Sent few days supply of klonopin.

## 2019-06-06 NOTE — Progress Notes (Signed)
Virtual Visit via Telephone Note  I connected with Steven [name] on 06/06/19 at  2:30 PM EDT by telephone and verified that I am speaking with the correct person using two identifiers.   I discussed the limitations, risks, security and privacy concerns of performing an evaluation and management service by telephone and the availability of in person appointments. I also discussed with the patient that there may be a patient responsible charge related to this service. The patient expressed understanding and agreed to proceed.  I discussed the assessment and treatment plan with the patient. The patient was provided an opportunity to ask questions and all were answered. The patient agreed with the plan and demonstrated an understanding of the instructions.   The patient was advised to call back or seek an in-person evaluation if the symptoms worsen or if the condition fails to improve as anticipated.  I provided 55 minutes of non-face-to-face time during this encounter.   Steven Hipp, LCSW    THERAPIST PROGRESS NOTE  Session Time: 1335  Participation Level: Active  Behavioral Response: NeatAlertAnxious  Type of Therapy: Individual Therapy  Treatment Goals addressed: Anxiety  Interventions: Supportive  Summary: Steven [name] is a 38 y.o. male who presents with continued symptoms related to his diagnosis. Steven [name] reports doing, "shitty," since our last session. He reports having a lot of stressors that have popped up over the last month. He reports his mother is receiving hospice care, and needs 24/7 assistance in her home. Steven [name] stated his family has not been able to find help they can pay, so they are all taking turns watching his mother. He reports this often results in him having a 12 hour shift of watching his mother throughout the day. LCSW validated Steven [name]'s feelings around this situation, and emphasized how difficult it can be to watch family members in that stage of life.  Steven [name] went on to discuss his recent hand injury. He reports he went to see a specialist and subsequently had to have surgery within one day of his appointment. He reports the surgery was extremely painful, and he is currently in more pain than he was prior to having surgery. LCSW validated feelings of frustration around Steven [name]'s medical problem, and encouraged him to focus on what he can control. Steven [name] was able to identify several things he can control about the situation, including starting physical therapy. Steven [name] added that his sister in law recently gave birth to a child (no one knew she was pregnant with), two months early, and the baby had meth in it's system. The sister-in-law already has two children that were in danger of going into DSS custody, so Steven [name] and his wife are now fostering those children, and will likely foster the baby when it is out of NICU. LCSW encouraged Steven [name] to recognize how many life altering things are going on in his life at the moment, and discussed how large of an impact those things will have on his mental health if he is not paying close attention to it. Steven [name] was in agreement and noted his anxiety has been extremely high. He reported having difficulty coming off his Klonopin as discussed with MD. LCSW encouraged Steven [name] to communicate that information with the MD. Steven [name] expressed understanding and agreement.   Suicidal/Homicidal: No  Therapist Response: Steven [name] continues to work towards his tx goals but has not yet reached them. We will continue to work on emotional regulation skills and decreasing anxiety symptoms via CBT.   Plan: Return again in  3 weeks.  Diagnosis: Axis I: Generalized Anxiety Disorder    Axis II: No diagnosis    Steven DachKelsey Sandro Burgo, LCSW 06/06/2019

## 2019-06-20 ENCOUNTER — Other Ambulatory Visit: Payer: Self-pay

## 2019-06-20 ENCOUNTER — Other Ambulatory Visit: Payer: Self-pay | Admitting: Adult Health

## 2019-06-27 ENCOUNTER — Encounter: Payer: Self-pay | Admitting: Psychiatry

## 2019-06-27 ENCOUNTER — Other Ambulatory Visit: Payer: Self-pay

## 2019-06-27 ENCOUNTER — Ambulatory Visit (INDEPENDENT_AMBULATORY_CARE_PROVIDER_SITE_OTHER): Payer: Medicare Other | Admitting: Psychiatry

## 2019-06-27 ENCOUNTER — Ambulatory Visit (INDEPENDENT_AMBULATORY_CARE_PROVIDER_SITE_OTHER): Payer: Medicare Other | Admitting: Licensed Clinical Social Worker

## 2019-06-27 ENCOUNTER — Encounter: Payer: Self-pay | Admitting: Licensed Clinical Social Worker

## 2019-06-27 DIAGNOSIS — F330 Major depressive disorder, recurrent, mild: Secondary | ICD-10-CM

## 2019-06-27 DIAGNOSIS — F411 Generalized anxiety disorder: Secondary | ICD-10-CM

## 2019-06-27 DIAGNOSIS — F410 Panic disorder [episodic paroxysmal anxiety] without agoraphobia: Secondary | ICD-10-CM

## 2019-06-27 DIAGNOSIS — Z79899 Other long term (current) drug therapy: Secondary | ICD-10-CM | POA: Diagnosis not present

## 2019-06-27 NOTE — Progress Notes (Signed)
Virtual Visit via Video Note  I connected with Steven [name] on 06/27/19 at  1:30 PM EDT by a video enabled telemedicine application and verified that I am speaking with the correct person using two identifiers.   I discussed the limitations of evaluation and management by telemedicine and the availability of in person appointments. The patient expressed understanding and agreed to proceed.  I discussed the assessment and treatment plan with the patient. The patient was provided an opportunity to ask questions and all were answered. The patient agreed with the plan and demonstrated an understanding of the instructions.   The patient was advised to call back or seek an in-person evaluation if the symptoms worsen or if the condition fails to improve as anticipated.  I provided 20  minutes of non-face-to-face time during this encounter.   Steven Hipp, LCSW    THERAPIST PROGRESS NOTE  Session Time: 1330  Participation Level: Active  Behavioral Response: NeatAlertAnxious  Type of Therapy: Individual Therapy  Treatment Goals addressed: Anxiety  Interventions: Supportive  Summary: Steven [name] is a 38 y.o. male who presents with continued symptoms related to his diagnosis.  Steven [name] reports doing well since our last session, but noted he has experienced increased anxiety symptoms over the last two weeks. He reports feeling panic at times, and was not able to identify the cause of his panic attacks. LCSW asked Steven [name] to walk through each panic attack he's had in the last two weeks so we could attempt to identify the root. He reported only having one panic attack over the last month, "I just tried to calm myself down." LCSW asked Steven [name] what has changed in the last month that could be causing an increase in anxiety. Steven [name] reported having to go back to [facility] soon for a follow up appointment, and may have to have additional surgery on his hand. LCSW pointed out that the unknown often  contributes to anxiety and can cause an increase in panic. Steven [name] was in agreement with this information. He noted it could also be "all that mess going on with my mom." Steven [name] reported his mom now requires 24/7 care, and he and his siblings have been taking on that responsibility. He reported he does the night shift, and his oldest son's girlfriend stays with him as well. We attempted to discuss how this could contribute to his anxiety as his wife has previously expressed frustration with the relationship between Steven [name] and his son's girlfriend. Steven [name] was not able to discuss this further as he was around his wife and did not want to discuss it. We discussed ways to calm himself down during moments of panic, like returning to the skills we utilized previously.   Suicidal/Homicidal: No  Therapist Response: Steven [name] continues to work towards his tx goals but has not yet reached them. He has gotten better at managing anxiety in the moment, but reports an increase in anxiety over the last month.  Plan: Return again in 4 weeks.  Diagnosis: Axis I: Generalized Anxiety Disorder    Axis II: No diagnosis    Steven Hipp, LCSW 06/27/2019

## 2019-06-27 NOTE — Progress Notes (Signed)
Virtual Visit via Video Note  I connected with Steven [name] on 06/27/19 at  4:15 PM EDT by a video enabled telemedicine application and verified that I am speaking with the correct person using two identifiers.   I discussed the limitations of evaluation and management by telemedicine and the availability of in person appointments. The patient expressed understanding and agreed to proceed.   I discussed the assessment and treatment plan with the patient. The patient was provided an opportunity to ask questions and all were answered. The patient agreed with the plan and demonstrated an understanding of the instructions.   The patient was advised to call back or seek an in-person evaluation if the symptoms worsen or if the condition fails to improve as anticipated.   BH MD OP Progress Note  06/27/2019 5:13 PM Steven PomfretRandy L [name]  MRN:  [id number]  Chief Complaint:  Chief Complaint    Follow-up     HPI: [name] is a 38 year old Caucasian male on SSD, lives in [place], has a history of generalized anxiety disorder, panic disorder, MDD, tachycardia, GERD, cardiomyopathy, pulmonary fibrosis, history of thyroid nodule, recent laceration to right hand and wrist, esophagitis was evaluated by telemedicine today.Video call was initiated , changed to phone call due to connection problem.   Patient reports he continues to be anxious , has a lot of stressors.  He reports he continues to have problems with his hand , has to go to PT regularly.  He reports he has not started taking his depakote yet more so because of a lot of stressors going on. He felt like he did not want to add another medication. He reports he continues to take Klonopin as needed , the last few days took it daily.   Provided medication education and discussed to taper of Klonopin. If he is not doing well with regards to his anxiety symptoms could add another medication in the place of Lexapro. Discussed to start taking Depakote  since he has anger issues and that is what led to his hand injury. He agrees with plan.  He denies SI/HI/AH/VH.       Visit Diagnosis:    ICD-10-CM   1. GAD (generalized anxiety disorder)  F41.1   2. Panic disorder  F41.0   3. MDD (major depressive disorder), recurrent episode, mild (HCC)  F33.0   4. High risk medication use  Z79.899 Valproic acid level    Comprehensive metabolic panel    CBC With Differential    Past Psychiatric History: I have reviewed past psychiatric history from my progress note on 09/13/2018.  Past trials of BuSpar, Wellbutrin, Abilify, Klonopin, Lexapro, Effexor.  Past Medical History:  Past Medical History:  Diagnosis Date  . Anxiety   . Arthritis    knee  . Asthma   . Chronic mental illness   . COPD (chronic obstructive pulmonary disease) (HCC)   . Depression   . GERD (gastroesophageal reflux disease)   . Heart palpitations   . Panic attack   . Pulmonary fibrosis (HCC)     Past Surgical History:  Procedure Laterality Date  . ESOPHAGOGASTRODUODENOSCOPY N/A 02/18/2019   Procedure: ESOPHAGOGASTRODUODENOSCOPY (EGD);  Surgeon: Toledo, Boykin Nearingeodoro K, MD;  Location: [facility] ENDOSCOPY;  Service: Gastroenterology;  Laterality: N/A;  . ESOPHAGOGASTRODUODENOSCOPY (EGD) WITH PROPOFOL N/A 09/05/2017   Procedure: ESOPHAGOGASTRODUODENOSCOPY (EGD) WITH PROPOFOL;  Surgeon: Toledo, Boykin Nearingeodoro K, MD;  Location: [facility] ENDOSCOPY;  Service: Gastroenterology;  Laterality: N/A;  . none    . TUBES IN [place]  Family Psychiatric History: I have reviewed family psychiatric history from my progress note on 09/13/2018.  Family History:  Family History  Problem Relation Age of Onset  . COPD Mother   . Asthma Mother   . Mental illness Mother   . COPD Father   . Pulmonary fibrosis Father   . Lung cancer Father     Social History: I have reviewed social history from my progress note on 09/13/2018. Social History   Socioeconomic History  . Marital status: Married    Spouse  name: heather  . Number of children: 4  . Years of education: Not on file  . Highest education level: Associate degree: occupational, Hotel manager, or vocational program  Occupational History  . Not on file  Social Needs  . Financial resource strain: Not hard at all  . Food insecurity    Worry: Never true    Inability: Never true  . Transportation needs    Medical: No    Non-medical: No  Tobacco Use  . Smoking status: Former Smoker    Packs/day: 0.50    Years: 2.00    Pack years: 1.00    Types: Cigarettes    Quit date: 10/31/2000    Years since quitting: 18.6  . Smokeless tobacco: Current User    Types: Chew  . Tobacco comment: 2003  Substance and Sexual Activity  . Alcohol use: No    Alcohol/week: 0.0 standard drinks  . Drug use: No  . Sexual activity: Yes  Lifestyle  . Physical activity    Days per week: 0 days    Minutes per session: 0 min  . Stress: Rather much  Relationships  . Social Herbalist on phone: Not on file    Gets together: Not on file    Attends religious service: Never    Active member of club or organization: No    Attends meetings of clubs or organizations: Never    Relationship status: Married  Other Topics Concern  . Not on file  Social History Narrative  . Not on file    Allergies:  Allergies  Allergen Reactions  . Diphenhydramine Anaphylaxis  . Guaifenesin Swelling    Bodily Swelling  . Doxycycline     tachycardia  . Pork-Derived Products   . Amoxicillin Nausea Only    Other reaction(s): Other (See Comments) Increases anxiety Other reaction(s): Other (See Comments) Increases anxiety    Metabolic Disorder Labs: No results found for: HGBA1C, MPG No results found for: PROLACTIN No results found for: CHOL, TRIG, HDL, CHOLHDL, VLDL, LDLCALC Lab Results  Component Value Date   TSH 1.550 09/14/2018    Therapeutic Level Labs: No results found for: LITHIUM No results found for: VALPROATE No components found for:   CBMZ  Current Medications: Current Outpatient Medications  Medication Sig Dispense Refill  . acetaminophen (TYLENOL) 500 MG tablet Take 1,000 mg by mouth every 6 (six) hours as needed for moderate pain.    Marland Kitchen albuterol (PROVENTIL) (2.5 MG/3ML) 0.083% nebulizer solution Inhale 3 mLs (2.5 mg total) into the lungs every 6 (six) hours as needed for wheezing or shortness of breath. 75 mL 4  . ATROVENT HFA 17 MCG/ACT inhaler Inhale 2 puffs into the lungs every 6 (six) hours as needed for wheezing. 1 Inhaler 0  . BREO ELLIPTA 100-25 MCG/INH AEPB Inhale 1 puff into the lungs daily. 1 each 3  . clonazePAM (KLONOPIN) 0.5 MG tablet Take 1 tablet (0.5 mg total) by mouth as directed.  Take one tablet up to 3 times a week as needed for anxiety attacks 12 tablet 0  . COMBIVENT RESPIMAT 20-100 MCG/ACT AERS respimat INHALE 2 PUFFS INTO LUNGS EVERY 4 HOURS. 8 g 0  . divalproex (DEPAKOTE) 125 MG DR tablet Take 1 tablet (125 mg total) by mouth 3 (three) times daily. 90 tablet 1  . EPIPEN 2-PAK 0.3 MG/0.3ML SOAJ injection 0.3 mg.     . escitalopram (LEXAPRO) 20 MG tablet Take 1 tablet (20 mg total) by mouth daily. 90 tablet 0  . fexofenadine (ALLEGRA) 180 MG tablet Take 180 mg by mouth daily as needed for allergies.     Marland Kitchen. FLOVENT HFA 220 MCG/ACT inhaler     . fluticasone (FLONASE) 50 MCG/ACT nasal spray Place 1 spray into both nostrils daily.    . fluticasone (FLOVENT HFA) 220 MCG/ACT inhaler Swallow, two sprays in the morning and two sprays at nightime.    Marland Kitchen. omeprazole (PRILOSEC) 20 MG capsule Take 20 mg by mouth 2 (two) times daily before a meal.     . OXYGEN Inhale into the lungs. 2LITERS AT NIGHT AND WHEN SICK    . PROAIR HFA 108 (90 Base) MCG/ACT inhaler INAHLE 2 PUFFS BY MOUTH EVERY 4 TO 6 HOURS AS NEEDED 8.5 g 0  . propranolol (INDERAL) 10 MG tablet Take 1 tablet (10 mg total) by mouth 2 (two) times daily as needed. FOR SEVERE ANXIETY ATTACKS 60 tablet 1   No current facility-administered medications for this  visit.      Musculoskeletal: Strength & Muscle Tone: UTA Gait & Station: Reports as wnl Patient leans: N/A  Psychiatric Specialty Exam: Review of Systems  Psychiatric/Behavioral: The patient is nervous/anxious.   All other systems reviewed and are negative.   There were no vitals taken for this visit.There is no height or weight on file to calculate BMI.  General Appearance: UTA  Eye Contact:  UTA  Speech:  Clear and Coherent  Volume:  Normal  Mood:  Anxious and Irritable  Affect:  UTA  Thought Process:  Goal Directed and Descriptions of Associations: Intact  Orientation:  Full (Time, Place, and Person)  Thought Content: Logical   Suicidal Thoughts:  No  Homicidal Thoughts:  No  Memory:  Immediate;   Fair Recent;   Fair Remote;   Fair  Judgement:  Fair  Insight:  Fair  Psychomotor Activity:  UTA  Concentration:  Concentration: Fair and Attention Span: Fair  Recall:  FiservFair  Fund of Knowledge: Fair  Language: Fair  Akathisia:  No  Handed:  Right  AIMS (if indicated): Denies tremors, rigidity  Assets:  Communication Skills Desire for Improvement Social Support  ADL's:  Intact  Cognition: WNL  Sleep:  Fair   Screenings: PHQ2-9     [facility] OTHER RESP [facility]  PHQ-2 Total Score  6  2  5   PHQ-9 Total Score  8  -  23       Assessment and Plan: [name] is a 38 year old male, married, lives in [place], has a history of pulmonary fibrosis, esophagitis, depression anxiety, GERD, COPD, asthma, tachycardia, recent laceration to right forearm and hand was evaluated by telemedicine today.  Patient is biologically predisposed given his family history as well as multiple health issues.Patient is noncompliant with treatment recommendations. Discussed the need for compliance. Plan as noted below  .  Plan MDD- some progress Lexapro as prescribed Depakote 125 mg p.o. 3 times daily- discussed starting it - he has been noncompliant. Depakote level, CMP,cbc- mail lab slip today.  GAD-unstable Lexapro as prescribed  Panic attacks-some progress Propranolol 10 mg p.o. twice daily as needed Klonopin is being tapered off.Discussed that will continue to reduce klonopin dosage , he will take it every three days or so , or as needed for severe anxiety attacks.  Patient to continue psychotherapy sessions with Ms. Heidi Dach for anger issues.  Follow-up in clinic in 4 weeks or sooner if needed.  I have spent atleast 15 minutes non face to face with patient today. More than 50 % of the time was spent for psychoeducation and supportive psychotherapy and care coordination. This note was generated in part or whole with voice recognition software. Voice recognition is usually quite accurate but there are transcription errors that can and very often do occur. I apologize for any typographical errors that were not detected and corrected.        Jomarie Longs, MD 06/27/2019, 5:13 PM

## 2019-07-16 ENCOUNTER — Other Ambulatory Visit: Payer: Self-pay | Admitting: Psychiatry

## 2019-07-16 DIAGNOSIS — F411 Generalized anxiety disorder: Secondary | ICD-10-CM

## 2019-07-18 ENCOUNTER — Ambulatory Visit (INDEPENDENT_AMBULATORY_CARE_PROVIDER_SITE_OTHER): Payer: Medicare Other | Admitting: Psychiatry

## 2019-07-18 ENCOUNTER — Other Ambulatory Visit: Payer: Self-pay

## 2019-07-18 DIAGNOSIS — Z5329 Procedure and treatment not carried out because of patient's decision for other reasons: Secondary | ICD-10-CM

## 2019-07-18 DIAGNOSIS — Z79899 Other long term (current) drug therapy: Secondary | ICD-10-CM | POA: Insufficient documentation

## 2019-07-18 NOTE — Progress Notes (Signed)
No response to text message with video link. Attempted to call patient - another person picked up and said " he is not here right now."

## 2019-07-23 ENCOUNTER — Ambulatory Visit: Payer: Medicare Other | Admitting: Internal Medicine

## 2019-07-23 ENCOUNTER — Ambulatory Visit (INDEPENDENT_AMBULATORY_CARE_PROVIDER_SITE_OTHER): Payer: Medicare Other | Admitting: Licensed Clinical Social Worker

## 2019-07-23 ENCOUNTER — Encounter: Payer: Self-pay | Admitting: Licensed Clinical Social Worker

## 2019-07-23 ENCOUNTER — Other Ambulatory Visit: Payer: Self-pay

## 2019-07-23 DIAGNOSIS — F411 Generalized anxiety disorder: Secondary | ICD-10-CM

## 2019-07-23 NOTE — Progress Notes (Signed)
Virtual Visit via Video Note  I connected with Penni Homans on 07/23/19 at 12:30 PM EDT by a video enabled telemedicine application and verified that I am speaking with the correct person using two identifiers.   I discussed the limitations of evaluation and management by telemedicine and the availability of in person appointments. The patient expressed understanding and agreed to proceed.  I discussed the assessment and treatment plan with the patient. The patient was provided an opportunity to ask questions and all were answered. The patient agreed with the plan and demonstrated an understanding of the instructions.   The patient was advised to call back or seek an in-person evaluation if the symptoms worsen or if the condition fails to improve as anticipated.  I provided 45 minutes of non-face-to-face time during this encounter.   Alden Hipp, LCSW    THERAPIST PROGRESS NOTE  Session Time: 1230  Participation Level: Active  Behavioral Response: CasualAlertAnxious  Type of Therapy: Individual Therapy  Treatment Goals addressed: Anxiety  Interventions: DBT  Summary: CRIS TALAVERA is a 38 y.o. male who presents with continued symptoms related to his diagnosis. Baruc reports doing well since our last session, but reported he is irritated with the MD in the clinic for not giving him Klonopin. Iker reports he ran out of Klonopin and requested a refill, "then I didn't look at the bottle and I'd been taking half a pill when I needed them thinking they were 1mg  pills. But, they ended up being .5mg  so I was only taking .25, which really was not helping my anxiety at all." Devine reported feeling frustrated with the MD as he feels Klonopin is the only thing that will help his anxiety, "she gave me all this other crap that I don't want to take." LCSW encouraged Artyom to at least try the new medications prescribed by the MD, and then if they don't work then he'll know those medications  don't work for him. LCSW also spent time providing education on the long term effects of benzos, and why they are not healthy. Edword expressed understanding and agreement, but added he is still frustrated. Yardley moved on to discussing his mother, "I get anxious every time I go over there and I don't know why." LCSW suggested it could be triggering his anxiety about returning to the state he was in during he time he lived there. Marquist stated he felt the same way, and did not know how to manage that feeling. LCSW suggested Jef talk himself through it, to remind his brain this is just a visit, and he's not going to stay. Makaveli expressed understanding and agreement. We reviewed how he could utilize distraction techniques as well as CBT skills.   Suicidal/Homicidal: No  Therapist Response: Jhace continues to work towards his tx goals but has not yet reached them. He will continue to work on emotional regulation skills and improving distress tolerance skills moving forward.   Plan: Return again in 4 weeks.  Diagnosis: Axis I: Generalized Anxiety Disorder    Axis II: No diagnosis    Alden Hipp, LCSW 07/23/2019

## 2019-08-06 ENCOUNTER — Other Ambulatory Visit: Payer: Self-pay

## 2019-08-06 MED ORDER — COMBIVENT RESPIMAT 20-100 MCG/ACT IN AERS: INHALATION_SPRAY | RESPIRATORY_TRACT | 0 refills | Status: DC

## 2019-08-20 ENCOUNTER — Ambulatory Visit: Payer: Medicare Other | Admitting: Internal Medicine

## 2019-08-26 ENCOUNTER — Other Ambulatory Visit: Payer: Self-pay

## 2019-08-26 ENCOUNTER — Ambulatory Visit (INDEPENDENT_AMBULATORY_CARE_PROVIDER_SITE_OTHER): Payer: Medicare Other | Admitting: Licensed Clinical Social Worker

## 2019-08-26 ENCOUNTER — Telehealth: Payer: Self-pay

## 2019-08-26 ENCOUNTER — Encounter: Payer: Self-pay | Admitting: Licensed Clinical Social Worker

## 2019-08-26 DIAGNOSIS — F411 Generalized anxiety disorder: Secondary | ICD-10-CM

## 2019-08-26 NOTE — Progress Notes (Signed)
  Virtual Visit via Telephone Note  I connected with Steven [name] on 08/26/19 at 12:30 PM EDT by telephone and verified that I am speaking with the correct person using two identifiers.   I discussed the limitations, risks, security and privacy concerns of performing an evaluation and management service by telephone and the availability of in person appointments. I also discussed with the patient that there may be a patient responsible charge related to this service. The patient expressed understanding and agreed to proceed.  I discussed the assessment and treatment plan with the patient. The patient was provided an opportunity to ask questions and all were answered. The patient agreed with the plan and demonstrated an understanding of the instructions.   The patient was advised to call back or seek an in-person evaluation if the symptoms worsen or if the condition fails to improve as anticipated.  I provided 45 minutes of non-face-to-face time during this encounter.   Steven Hipp, LCSW   THERAPIST PROGRESS NOTE  Session Time: 1230  Participation Level: Active  Behavioral Response: NeatAlertAnxious  Type of Therapy: Individual Therapy  Treatment Goals addressed: Coping  Interventions: CBT  Summary: Steven [name] is a 38 y.o. male who presents with continued symptoms related to his diagnosis. Steven [name] reports doing "okay," since our last session. He reports ongoing anxiety which he is unable to pinpoint the cause of. He reports feeling like he can't breathe and that he is going to die. He reported he woke up feeling that way this morning. LCSW asked what Steven [name] has done to attempt to mitigate his anxiety symptoms. Steven [name] reported he attempted to just go about his day but was unable to decrease his anxiety. LCSW encouraged Steven [name] to utilize the skills we've discussed in previous sessions, and challenge anxious thoughts as they come up by utilizing evidence for and against his anxious  thought. We walked through several examples, and Steven [name] was able to understand and remember how to best use this skill. Further, he discussed not being able to get Klonopin from the doctor, so has been getting them by other means. LCSW explained how Klonopin can impact the brain negatively, and how it is unwise to get Klonopin from sources other than a doctor. Further, we discussed medication adherence. Steven [name] reported he has not started the new medication the MD prescribed him. We discussed, at length, why it was a good idea to at least give the medication a shot. Steven [name] was able to understand the importance of this and expressed agreement.   Suicidal/Homicidal: No  Therapist Response: Steven [name] continues to work towards his tx goals but has not yet reached them. We will continue to work on emotional regulation skills moving forward and will continue to work on challenging negative thoughts to improve distress tolerance.   Plan: Return again in 4 weeks.  Diagnosis: Axis I: Generalized Anxiety Disorder    Axis II: No diagnosis    Steven Hipp, LCSW 08/26/2019

## 2019-08-26 NOTE — Telephone Encounter (Signed)
pt called states that he needs a refill on his klonopin.  pt was told that he needed a apppt and he was tranfered to front desk to make an appt

## 2019-08-26 NOTE — Telephone Encounter (Signed)
Ok thank you , will talk to him at his appointment tomorrow.

## 2019-08-27 ENCOUNTER — Other Ambulatory Visit: Payer: Self-pay

## 2019-08-27 ENCOUNTER — Encounter: Payer: Self-pay | Admitting: Psychiatry

## 2019-08-27 ENCOUNTER — Ambulatory Visit (INDEPENDENT_AMBULATORY_CARE_PROVIDER_SITE_OTHER): Payer: Medicare Other | Admitting: Psychiatry

## 2019-08-27 DIAGNOSIS — F411 Generalized anxiety disorder: Secondary | ICD-10-CM | POA: Diagnosis not present

## 2019-08-27 DIAGNOSIS — F330 Major depressive disorder, recurrent, mild: Secondary | ICD-10-CM

## 2019-08-27 DIAGNOSIS — F410 Panic disorder [episodic paroxysmal anxiety] without agoraphobia: Secondary | ICD-10-CM | POA: Diagnosis not present

## 2019-08-27 MED ORDER — CLONAZEPAM 0.5 MG PO TABS: 0.2500 mg | ORAL_TABLET | ORAL | 1 refills | Status: DC

## 2019-08-27 MED ORDER — COMBIVENT RESPIMAT 20-100 MCG/ACT IN AERS: INHALATION_SPRAY | RESPIRATORY_TRACT | 0 refills | Status: DC

## 2019-08-27 NOTE — Progress Notes (Signed)
Virtual Visit via Video Note  I connected with Steven [name] on 08/27/19 at  1:00 PM EDT by a video enabled telemedicine application and verified that I am speaking with the correct person using two identifiers.   I discussed the limitations of evaluation and management by telemedicine and the availability of in person appointments. The patient expressed understanding and agreed to proceed.  I discussed the assessment and treatment plan with the patient. The patient was provided an opportunity to ask questions and all were answered. The patient agreed with the plan and demonstrated an understanding of the instructions.   The patient was advised to call back or seek an in-person evaluation if the symptoms worsen or if the condition fails to improve as anticipated.   BH MD OP Progress Note  08/27/2019 5:09 PM Steven [name]  MRN:  [id number]  Chief Complaint:  Chief Complaint    Follow-up     HPI: Steven [name] is a 38 year old Caucasian male on SSD, lives in [place], history of GAD, MDD, panic disorder, tachycardia, GERD, cardiomyopathy, pulmonary fibrosis, history of thyroid nodule, recent laceration to right hand and wrist, esophagitis was evaluated by telemedicine today.  A video call was initiated however due to connection problem it was changed to a phone call.  Patient today reports he is currently struggling with anxiety because his mother is having more health problems.  He reports he also continues to have problems of his hand from the injury that he had and continue to work with his providers.  He reports he did not start taking the Depakote yet.  He had a discussion with his therapist recently and is planning to start taking it.  He reports he is worried about the side effects.  Discussed with patient to start once a day dosage and monitor himself if he is having side effects.  Patient reports he has the lab slip at home and if he does not have it he will call the clinic and let us  know to send another one.  Discussed with him to get the Depakote labs as well as CMP and CBC done after a week of taking the medication.  Patient continues to report he takes Klonopin several times a week.  He reports he wants to stay on this dosage now given his anxiety symptoms.  He reports Lexapro does help and he does not want to change the Lexapro to another medication yet.  Patient seems to be very resistant to letting go of the Klonopin and would possibly would like to go back to taking it daily rather than trying to wean it off.  Some time was spent providing education.  Discussed the long-term risk of being on benzodiazepine therapy.  Patient reports sleep is good.  He denies any suicidality, homicidality or perceptual disturbances.     Visit Diagnosis:    ICD-10-CM   1. GAD (generalized anxiety disorder)  F41.1 clonazePAM (KLONOPIN) 0.5 MG tablet  2. Panic disorder  F41.0 clonazePAM (KLONOPIN) 0.5 MG tablet  3. MDD (major depressive disorder), recurrent episode, mild (HCC)  F33.0     Past Psychiatric History: I have reviewed past psychiatric history from my progress note on 09/13/2018.  Past trials of BuSpar, Wellbutrin, Abilify, Klonopin, Lexapro, Effexor.  Past Medical History:  Past Medical History:  Diagnosis Date  . Anxiety   . Arthritis    knee  . Asthma   . Chronic mental illness   . COPD (chronic obstructive pulmonary disease) (HCC)   .  Depression   . GERD (gastroesophageal reflux disease)   . Heart palpitations   . Panic attack   . Pulmonary fibrosis ([place])     Past Surgical History:  Procedure Laterality Date  . ESOPHAGOGASTRODUODENOSCOPY N/A 02/18/2019   Procedure: ESOPHAGOGASTRODUODENOSCOPY (EGD);  Surgeon: Toledo, Benay Pike, MD;  Location: [facility] ENDOSCOPY;  Service: Gastroenterology;  Laterality: N/A;  . ESOPHAGOGASTRODUODENOSCOPY (EGD) WITH PROPOFOL N/A 09/05/2017   Procedure: ESOPHAGOGASTRODUODENOSCOPY (EGD) WITH PROPOFOL;  Surgeon: Toledo, Benay Pike,  MD;  Location: [facility] ENDOSCOPY;  Service: Gastroenterology;  Laterality: N/A;  . none    . TUBES IN EARS      Family Psychiatric History: I have reviewed family psychiatric history from my progress note on 09/13/2018.  Family History:  Family History  Problem Relation Age of Onset  . COPD Mother   . Asthma Mother   . Mental illness Mother   . COPD Father   . Pulmonary fibrosis Father   . Lung cancer Father     Social History: I have reviewed social history from my progress note on 09/13/2018. Social History   Socioeconomic History  . Marital status: Married    Spouse name: heather  . Number of children: 4  . Years of education: Not on file  . Highest education level: Associate degree: occupational, Hotel manager, or vocational program  Occupational History  . Not on file  Social Needs  . Financial resource strain: Not hard at all  . Food insecurity    Worry: Never true    Inability: Never true  . Transportation needs    Medical: No    Non-medical: No  Tobacco Use  . Smoking status: Former Smoker    Packs/day: 0.50    Years: 2.00    Pack years: 1.00    Types: Cigarettes    Quit date: 10/31/2000    Years since quitting: 18.8  . Smokeless tobacco: Current User    Types: Chew  . Tobacco comment: 2003  Substance and Sexual Activity  . Alcohol use: No    Alcohol/week: 0.0 standard drinks  . Drug use: No  . Sexual activity: Yes  Lifestyle  . Physical activity    Days per week: 0 days    Minutes per session: 0 min  . Stress: Rather much  Relationships  . Social Herbalist on phone: Not on file    Gets together: Not on file    Attends religious service: Never    Active member of club or organization: No    Attends meetings of clubs or organizations: Never    Relationship status: Married  Other Topics Concern  . Not on file  Social History Narrative  . Not on file    Allergies:  Allergies  Allergen Reactions  . Diphenhydramine Anaphylaxis  .  Guaifenesin Swelling    Bodily Swelling  . Doxycycline     tachycardia  . Pork-Derived Products   . Amoxicillin Nausea Only    Other reaction(s): Other (See Comments) Increases anxiety Other reaction(s): Other (See Comments) Increases anxiety    Metabolic Disorder Labs: No results found for: HGBA1C, MPG No results found for: PROLACTIN No results found for: CHOL, TRIG, HDL, CHOLHDL, VLDL, LDLCALC Lab Results  Component Value Date   TSH 1.550 09/14/2018    Therapeutic Level Labs: No results found for: LITHIUM No results found for: VALPROATE No components found for:  CBMZ  Current Medications: Current Outpatient Medications  Medication Sig Dispense Refill  . acetaminophen (TYLENOL) 500 MG tablet  Take 1,000 mg by mouth every 6 (six) hours as needed for moderate pain.    Marland Kitchen albuterol (PROVENTIL) (2.5 MG/3ML) 0.083% nebulizer solution Inhale 3 mLs (2.5 mg total) into the lungs every 6 (six) hours as needed for wheezing or shortness of breath. 75 mL 4  . ATROVENT HFA 17 MCG/ACT inhaler Inhale 2 puffs into the lungs every 6 (six) hours as needed for wheezing. 1 Inhaler 0  . BREO ELLIPTA 100-25 MCG/INH AEPB Inhale 1 puff into the lungs daily. 1 each 3  . COMBIVENT RESPIMAT 20-100 MCG/ACT AERS respimat INHALE 2 PUFFS INTO LUNGS EVERY 4 HOURS. 8 g 0  . EPIPEN 2-PAK 0.3 MG/0.3ML SOAJ injection 0.3 mg.     . escitalopram (LEXAPRO) 20 MG tablet TAKE 1 TABLET BY MOUTH ONCE DAILY. 90 tablet 0  . fexofenadine (ALLEGRA) 180 MG tablet Take 180 mg by mouth daily as needed for allergies.     Marland Kitchen FLOVENT HFA 220 MCG/ACT inhaler     . fluticasone (FLONASE) 50 MCG/ACT nasal spray Place 1 spray into both nostrils daily.    . fluticasone (FLOVENT HFA) 220 MCG/ACT inhaler Swallow, two sprays in the morning and two sprays at nightime.    Marland Kitchen omeprazole (PRILOSEC) 20 MG capsule Take 20 mg by mouth 2 (two) times daily before a meal.     . OXYGEN Inhale into the lungs. 2LITERS AT NIGHT AND WHEN SICK    .  predniSONE (DELTASONE) 20 MG tablet Take 20 mg by mouth daily with breakfast. For 5 days    . PROAIR HFA 108 (90 Base) MCG/ACT inhaler INAHLE 2 PUFFS BY MOUTH EVERY 4 TO 6 HOURS AS NEEDED 8.5 g 0  . propranolol (INDERAL) 10 MG tablet Take 1 tablet (10 mg total) by mouth 2 (two) times daily as needed. FOR SEVERE ANXIETY ATTACKS 60 tablet 1  . clonazePAM (KLONOPIN) 0.5 MG tablet Take 0.5-1 tablets (0.25-0.5 mg total) by mouth as directed. Take half to  one tablet up to 3 times a week as needed for anxiety attacks 12 tablet 1  . divalproex (DEPAKOTE) 125 MG DR tablet Take 1 tablet (125 mg total) by mouth 3 (three) times daily. 90 tablet 1   No current facility-administered medications for this visit.      Musculoskeletal: Strength & Muscle Tone: UTA Gait & Station: Reports as WNL Patient leans: N/A  Psychiatric Specialty Exam: Review of Systems  Psychiatric/Behavioral: The patient is nervous/anxious.   All other systems reviewed and are negative.   There were no vitals taken for this visit.There is no height or weight on file to calculate BMI.  General Appearance: UTA  Eye Contact:  UTA  Speech:  Clear and Coherent  Volume:  Normal  Mood:  Anxious  Affect:  UTA  Thought Process:  Goal Directed and Descriptions of Associations: Intact  Orientation:  Full (Time, Place, and Person)  Thought Content: Logical   Suicidal Thoughts:  No  Homicidal Thoughts:  No  Memory:  Immediate;   Fair Recent;   Fair Remote;   Fair  Judgement:  Fair  Insight:  Fair  Psychomotor Activity:  UTA  Concentration:  Concentration: Fair and Attention Span: Fair  Recall:  [employer] of Knowledge: Fair  Language: Fair  Akathisia:  No  Handed:  Right  AIMS (if indicated):Denies tremors, rigidity  Assets:  Communication Skills Desire for Improvement Housing Social Support  ADL's:  Intact  Cognition: WNL  Sleep:  Fair   Screenings: PHQ2-9  [facility] OTHER RESP ORIENTATION from [facility]  PHQ-2 Total Score  6  2  5   PHQ-9 Total Score  8  -  23       Assessment and Plan: [name] is a 38 year old male, married, lives in [place] , has a history of pulmonary fibrosis, esophagitis, GAD, panic attacks, COPD, asthma, tachycardia, was evaluated by telemedicine today.  He is biologically predisposed given his family history as well as multiple health issues.  Patient has been noncompliant with treatment recommendations.  Patient continues to be resistant to be tapered off of the Klonopin completely.  Educated patient.  Encourage patient to start the Depakote.  Advised to monitor for side effects and stopped the medication and let writer know if he has any adverse side effects.  Plan as noted below.  Plan MDD-stable Lexapro as prescribed Depakote 125 mg p.o. 3 times daily.  Patient has been noncompliant. Pending labs-Depakote level, CMP, CBC  GAD-unstable Lexapro as prescribed Advised patient to start taking Depakote. Continue Klonopin 0.25 to 0.5 mg 2-3 times a week as needed for severe anxiety attacks  Panic attacks-improving Propranolol 10 mg p.o. twice daily as needed-patient is noncompliant on propranolol.  Klonopin currently being tapered off.  Patient continues to be in psychotherapy sessions with [name].  Follow-up in clinic in 2 weeks or sooner if needed.  November 10th at 1:45 PM  I have spent atleast 15 minutes non face to face with patient today. More than 50 % of the time was spent for psychoeducation and supportive psychotherapy and care coordination. This note was generated in part or whole with voice recognition software. Voice recognition is usually quite accurate but there are transcription errors that can and very often do occur. I apologize for any typographical errors that were not detected and  corrected.       Jomarie LongsSaramma  [place]hammer, MD 08/27/2019, 5:09 PM

## 2019-08-30 ENCOUNTER — Other Ambulatory Visit: Payer: Self-pay | Admitting: Internal Medicine

## 2019-08-30 MED ORDER — COMBIVENT RESPIMAT 20-100 MCG/ACT IN AERS: INHALATION_SPRAY | RESPIRATORY_TRACT | 0 refills | Status: DC

## 2019-09-03 ENCOUNTER — Encounter: Payer: Self-pay | Admitting: Internal Medicine

## 2019-09-03 ENCOUNTER — Ambulatory Visit (INDEPENDENT_AMBULATORY_CARE_PROVIDER_SITE_OTHER): Payer: Medicare Other | Admitting: Internal Medicine

## 2019-09-03 ENCOUNTER — Other Ambulatory Visit: Payer: Self-pay

## 2019-09-03 VITALS — BP 112/77 | HR 72 | Temp 98.1°F | Resp 16 | Ht 67.0 in | Wt 261.4 lb

## 2019-09-03 DIAGNOSIS — J301 Allergic rhinitis due to pollen: Secondary | ICD-10-CM | POA: Diagnosis not present

## 2019-09-03 DIAGNOSIS — J449 Chronic obstructive pulmonary disease, unspecified: Secondary | ICD-10-CM

## 2019-09-03 DIAGNOSIS — R0602 Shortness of breath: Secondary | ICD-10-CM

## 2019-09-03 DIAGNOSIS — W44F3XA Food entering into or through a natural orifice, initial encounter: Secondary | ICD-10-CM

## 2019-09-03 DIAGNOSIS — J4489 Other specified chronic obstructive pulmonary disease: Secondary | ICD-10-CM

## 2019-09-03 DIAGNOSIS — Z9981 Dependence on supplemental oxygen: Secondary | ICD-10-CM

## 2019-09-03 DIAGNOSIS — K222 Esophageal obstruction: Secondary | ICD-10-CM

## 2019-09-03 DIAGNOSIS — T18128A Food in esophagus causing other injury, initial encounter: Secondary | ICD-10-CM

## 2019-09-03 NOTE — Progress Notes (Signed)
[facility] [address] [place], [place] [address]  Pulmonary Sleep Medicine   Office Visit Note  Patient Name: Steven PomfretRandy L [name] DOB: September 11, 1981 MRN [id number]  Date of Service: 09/03/2019  Complaints/HPI: Patient currently is doing well apparently had a fall through glass and was in the hospital for quite some time.  Patient states that he is having issues with allergies he cannot take Benadryl because he had a severe allergy to the Benadryl.  States that the Allegra does not seem to be helping him.  Patient also states that he was recently seen by GI for impacted food and had to have an EGD done.  He has very poor oral dentition and probably needs to have the teeth pulled out as he would be at increased risk for infection along with his COPD.  His COPD is concerned it is moderately severe he does need to have follow-up functions which will be set up for him  ROS  General: (-) fever, (-) chills, (-) night sweats, (-) weakness Skin: (-) rashes, (-) itching,. Eyes: (-) visual changes, (-) redness, (-) itching. Nose and Sinuses: (-) nasal stuffiness or itchiness, (-) postnasal drip, (-) nosebleeds, (-) sinus trouble. Mouth and Throat: (-) sore throat, (-) hoarseness. Neck: (-) swollen glands, (-) enlarged thyroid, (-) neck pain. Respiratory: - cough, (-) bloody sputum, + shortness of breath, - wheezing. Cardiovascular: - ankle swelling, (-) chest pain. Lymphatic: (-) lymph node enlargement. Neurologic: (-) numbness, (-) tingling. Psychiatric: (-) anxiety, (-) depression   Current Medication: Outpatient Encounter Medications as of 09/03/2019  Medication Sig  . acetaminophen (TYLENOL) 500 MG tablet Take 1,000 mg by mouth every 6 (six) hours as needed for moderate pain.  Marland Kitchen. albuterol (PROVENTIL) (2.5 MG/3ML) 0.083% nebulizer solution Inhale 3 mLs (2.5 mg total) into the lungs every 6 (six) hours as needed for wheezing or shortness of breath.  . ATROVENT HFA 17 MCG/ACT  inhaler Inhale 2 puffs into the lungs every 6 (six) hours as needed for wheezing.  Marland Kitchen. BREO ELLIPTA 100-25 MCG/INH AEPB Inhale 1 puff into the lungs daily.  . clonazePAM (KLONOPIN) 0.5 MG tablet Take 0.5-1 tablets (0.25-0.5 mg total) by mouth as directed. Take half to  one tablet up to 3 times a week as needed for anxiety attacks  . COMBIVENT RESPIMAT 20-100 MCG/ACT AERS respimat INHALE 2 PUFFS INTO LUNGS EVERY 4 HOURS.  Marland Kitchen. divalproex (DEPAKOTE) 125 MG DR tablet Take 1 tablet (125 mg total) by mouth 3 (three) times daily.  Marland Kitchen. EPIPEN 2-PAK 0.3 MG/0.3ML SOAJ injection 0.3 mg.   . escitalopram (LEXAPRO) 20 MG tablet TAKE 1 TABLET BY MOUTH ONCE DAILY.  . fexofenadine (ALLEGRA) 180 MG tablet Take 180 mg by mouth daily as needed for allergies.   Marland Kitchen. FLOVENT HFA 220 MCG/ACT inhaler   . fluticasone (FLONASE) 50 MCG/ACT nasal spray Place 1 spray into both nostrils daily.  . fluticasone (FLOVENT HFA) 220 MCG/ACT inhaler Swallow, two sprays in the morning and two sprays at nightime.  Marland Kitchen. omeprazole (PRILOSEC) 20 MG capsule Take 20 mg by mouth 2 (two) times daily before a meal.   . OXYGEN Inhale into the lungs. 2LITERS AT NIGHT AND WHEN SICK  . PROAIR HFA 108 (90 Base) MCG/ACT inhaler INAHLE 2 PUFFS BY MOUTH EVERY 4 TO 6 HOURS AS NEEDED  . propranolol (INDERAL) 10 MG tablet Take 1 tablet (10 mg total) by mouth 2 (two) times daily as needed. FOR SEVERE ANXIETY ATTACKS  . [DISCONTINUED] predniSONE (DELTASONE) 20 MG tablet  Take 20 mg by mouth daily with breakfast. For 5 days   No facility-administered encounter medications on file as of 09/03/2019.     Surgical History: Past Surgical History:  Procedure Laterality Date  . ESOPHAGOGASTRODUODENOSCOPY N/A 02/18/2019   Procedure: ESOPHAGOGASTRODUODENOSCOPY (EGD);  Surgeon: Toledo, Benay Pike, MD;  Location: [facility] ENDOSCOPY;  Service: Gastroenterology;  Laterality: N/A;  . ESOPHAGOGASTRODUODENOSCOPY (EGD) WITH PROPOFOL N/A 09/05/2017   Procedure: ESOPHAGOGASTRODUODENOSCOPY  (EGD) WITH PROPOFOL;  Surgeon: Toledo, Benay Pike, MD;  Location: [facility] ENDOSCOPY;  Service: Gastroenterology;  Laterality: N/A;  . none    . TUBES IN EARS      Medical History: Past Medical History:  Diagnosis Date  . Anxiety   . Arthritis    knee  . Asthma   . Chronic mental illness   . COPD (chronic obstructive pulmonary disease) ([place])   . Depression   . GERD (gastroesophageal reflux disease)   . Heart palpitations   . Panic attack   . Pulmonary fibrosis ([place])     Family History: Family History  Problem Relation Age of Onset  . COPD Mother   . Asthma Mother   . Mental illness Mother   . COPD Father   . Pulmonary fibrosis Father   . Lung cancer Father     Social History: Social History   Socioeconomic History  . Marital status: Married    Spouse name: heather  . Number of children: 4  . Years of education: Not on file  . Highest education level: Associate degree: occupational, Hotel manager, or vocational program  Occupational History  . Not on file  Social Needs  . Financial resource strain: Not hard at all  . Food insecurity    Worry: Never true    Inability: Never true  . Transportation needs    Medical: No    Non-medical: No  Tobacco Use  . Smoking status: Former Smoker    Packs/day: 0.50    Years: 2.00    Pack years: 1.00    Types: Cigarettes    Quit date: 10/31/2000    Years since quitting: 18.8  . Smokeless tobacco: Current User    Types: Chew  . Tobacco comment: 2003  Substance and Sexual Activity  . Alcohol use: No    Alcohol/week: 0.0 standard drinks  . Drug use: No  . Sexual activity: Yes  Lifestyle  . Physical activity    Days per week: 0 days    Minutes per session: 0 min  . Stress: Rather much  Relationships  . Social Herbalist on phone: Not on file    Gets together: Not on file    Attends religious service: Never    Active member of club or organization: No    Attends meetings of clubs or organizations: Never     Relationship status: Married  . Intimate partner violence    Fear of current or ex partner: No    Emotionally abused: No    Physically abused: No    Forced sexual activity: No  Other Topics Concern  . Not on file  Social History Narrative  . Not on file    Vital Signs: Blood pressure 112/77, pulse 72, temperature 98.1 F (36.7 C), resp. rate 16, height 5\' 7"  (1.702 m), weight 261 lb 6.4 oz (118.6 kg), SpO2 97 %.  Examination: General Appearance: The patient is well-developed, well-nourished, and in no distress. Skin: Gross inspection of skin unremarkable. Head: normocephalic, no gross deformities. Eyes: no gross deformities  noted. ENT: ears appear grossly normal no exudates. Neck: Supple. No thyromegaly. No LAD. Respiratory: no rhonchi noted. Cardiovascular: Normal S1 and S2 without murmur or rub. Extremities: No cyanosis. pulses are equal. Neurologic: Alert and oriented. No involuntary movements.  LABS: No results found for this or any previous visit (from the past 2160 hour(s)).  Radiology: Dg Wrist Complete Right  Result Date: 04/29/2019 CLINICAL DATA:  Multiple lacerations. EXAM: RIGHT WRIST - COMPLETE 3+ VIEW COMPARISON:  None. FINDINGS: The bones are normal. No radiodense foreign bodies in the soft tissues. Soft tissue lacerations. IMPRESSION: Lacerations.  No acute bone abnormality. Electronically Signed   By: Francene Boyers M.D.   On: 04/29/2019 20:46   Dg Hand 2 View Right  Result Date: 04/29/2019 CLINICAL DATA:  Multiple lacerations. EXAM: RIGHT HAND - 2 VIEW COMPARISON:  Radiographs dated 04/07/2007 FINDINGS: There is no fracture or dislocation. Soft tissue lacerations. No visible radiodense foreign body in the soft tissues. IMPRESSION: Lacerations.  Normal bones.  No foreign bodies. Electronically Signed   By: Francene Boyers M.D.   On: 04/29/2019 20:45    No results found.  No results found.    Assessment and Plan: Patient Active Problem List   Diagnosis  Date Noted  . High risk medication use 07/18/2019  . Flexor tendon laceration, wrist, open wound, left, subsequent encounter 05/20/2019  . GAD (generalized anxiety disorder) 05/08/2019  . Panic disorder 05/08/2019  . MDD (major depressive disorder), recurrent episode, mild (HCC) 05/08/2019  . Intermittent explosive disorder in adult 05/08/2019  . Gastroesophageal reflux disease without esophagitis 02/18/2019  . PVC's (premature ventricular contractions) 01/31/2019  . Allergy to meat 10/11/2018  . Chronic left upper quadrant pain 10/11/2018  . 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 Heart palpitations 05/31/2018  . Anxiety, generalized 05/31/2018  . Asthma, chronic 04/05/2015  . Chronic obstructive airway disease with asthma (HCC) 03/17/2015  . Obesity 03/17/2015  . DOE (dyspnea on exertion) 03/17/2015  . Environmental allergies 04/03/2014  . Chest pain 08/02/2013  . Thyroid nodule 03/21/2013    1. COPD continue with present management patient does have moderately severe COPD and needs to have follow-up pulmonary functions also medications were reviewed 2. Esophageal Impaction status post EGD we will continue with supportive care 3. Allergic rhinitis continue with present management antihistamines told that he might want to consider H2 blockers as an alternative 4. Oxygen at night continue with oxygen therapy 5. Morbid Obesity dietary management 6. Poor oral dentition I have recommended he see a dentist regarding possibility of pulling up his teeth that are remaining in his upper jaw  General Counseling: I have discussed the findings of the evaluation and examination with Steven [name].  I have also discussed any further diagnostic evaluation thatmay be needed or ordered today. Steven [name] verbalizes understanding of the findings of todays visit. We also reviewed his medications today and discussed drug interactions and side effects including but not limited excessive drowsiness and altered mental states.  We also discussed that there is always a risk not just to him but also people around him. he has been encouraged to call the office with any questions or concerns that should arise related to todays visit.    Time spent:  I have personally obtained a history, examined the patient, evaluated laboratory and imaging results, formulated the assessment and plan and placed orders.    Yevonne Pax, MD [facility] Pulmonary and Critical Care Sleep medicine

## 2019-09-03 NOTE — Patient Instructions (Signed)

## 2019-09-10 ENCOUNTER — Encounter: Payer: Self-pay | Admitting: Psychiatry

## 2019-09-10 ENCOUNTER — Other Ambulatory Visit: Payer: Self-pay

## 2019-09-10 ENCOUNTER — Ambulatory Visit (INDEPENDENT_AMBULATORY_CARE_PROVIDER_SITE_OTHER): Payer: Medicare Other | Admitting: Psychiatry

## 2019-09-10 DIAGNOSIS — F330 Major depressive disorder, recurrent, mild: Secondary | ICD-10-CM

## 2019-09-10 DIAGNOSIS — F410 Panic disorder [episodic paroxysmal anxiety] without agoraphobia: Secondary | ICD-10-CM | POA: Diagnosis not present

## 2019-09-10 DIAGNOSIS — F411 Generalized anxiety disorder: Secondary | ICD-10-CM | POA: Diagnosis not present

## 2019-09-10 NOTE — Progress Notes (Signed)
Virtual Visit via Video Note  I connected with Steven [name] on 09/10/19 at  1:45 PM EST by a video enabled telemedicine application and verified that I am speaking with the correct person using two identifiers.   I discussed the limitations of evaluation and management by telemedicine and the availability of in person appointments. The patient expressed understanding and agreed to proceed.     I discussed the assessment and treatment plan with the patient. The patient was provided an opportunity to ask questions and all were answered. The patient agreed with the plan and demonstrated an understanding of the instructions.   The patient was advised to call back or seek an in-person evaluation if the symptoms worsen or if the condition fails to improve as anticipated.   BH MD OP Progress Note  09/10/2019 5:36 PM ELIAV MECHLING  MRN:  [id number]  Chief Complaint:  Chief Complaint    Follow-up     HPI: Steven [name] is a 38 year old Caucasian male, on SSD, lives in [place], has a history of GAD, MDD, panic disorder, tachycardia, GERD, cardiomyopathy, pulmonary fibrosis, history of thyroid nodule, recent laceration to right hand and wrist, esophagitis was evaluated by telemedicine today.  A video call was initiated however due to connection problem it had to be changed to a phone call.  Patient today reports he is currently struggling with anxiety symptoms.  He reports he continues to have psychosocial stressors of his mother's health issues.  Patient reports he started Taking the Depakote however takes a very low dosage of 125 mg daily.  Patient has not been able to get the labs done yet.  Patient agrees to increase the dosage to 2 tablets daily and then to 3 tablets after that.  Patient currently denies any side effects.  Patient continues to be compliant with Lexapro and declines any medication changes with that today.  He reports therapy sessions with Ms. Heidi Dach is going  well.  Patient has been trying to limit the use of Klonopin.  Patient denies any suicidality, homicidality or perceptual disturbances. Visit Diagnosis:    ICD-10-CM   1. GAD (generalized anxiety disorder)  F41.1   2. Panic disorder  F41.0   3. MDD (major depressive disorder), recurrent episode, mild (HCC)  F33.0     Past Psychiatric History: I have reviewed past psychiatric history from my progress note on 09/13/2018.  Past trials of BuSpar, Wellbutrin, Abilify, Klonopin, Lexapro, Effexor  Past Medical History:  Past Medical History:  Diagnosis Date  . Anxiety   . Arthritis    knee  . Asthma   . Chronic mental illness   . COPD (chronic obstructive pulmonary disease) (HCC)   . Depression   . GERD (gastroesophageal reflux disease)   . Heart palpitations   . Panic attack   . Pulmonary fibrosis (HCC)     Past Surgical History:  Procedure Laterality Date  . ESOPHAGOGASTRODUODENOSCOPY N/A 02/18/2019   Procedure: ESOPHAGOGASTRODUODENOSCOPY (EGD);  Surgeon: Toledo, Boykin Nearing, MD;  Location: [facility] ENDOSCOPY;  Service: Gastroenterology;  Laterality: N/A;  . ESOPHAGOGASTRODUODENOSCOPY (EGD) WITH PROPOFOL N/A 09/05/2017   Procedure: ESOPHAGOGASTRODUODENOSCOPY (EGD) WITH PROPOFOL;  Surgeon: Toledo, Boykin Nearing, MD;  Location: [facility] ENDOSCOPY;  Service: Gastroenterology;  Laterality: N/A;  . none    . TUBES IN EARS      Family Psychiatric History: Reviewed family psychiatric history from my progress note on 09/13/2018  Family History:  Family History  Problem Relation Age of Onset  . COPD Mother   .  Asthma Mother   . Mental illness Mother   . COPD Father   . Pulmonary fibrosis Father   . Lung cancer Father     Social History: Reviewed social history from my progress note on 09/13/2018 Social History   Socioeconomic History  . Marital status: Married    Spouse name: heather  . Number of children: 4  . Years of education: Not on file  . Highest education level: Associate degree:  occupational, Scientist, product/process developmenttechnical, or vocational program  Occupational History  . Not on file  Social Needs  . Financial resource strain: Not hard at all  . Food insecurity    Worry: Never true    Inability: Never true  . Transportation needs    Medical: No    Non-medical: No  Tobacco Use  . Smoking status: Former Smoker    Packs/day: 0.50    Years: 2.00    Pack years: 1.00    Types: Cigarettes    Quit date: 10/31/2000    Years since quitting: 18.8  . Smokeless tobacco: Current User    Types: Chew  . Tobacco comment: 2003  Substance and Sexual Activity  . Alcohol use: No    Alcohol/week: 0.0 standard drinks  . Drug use: No  . Sexual activity: Yes  Lifestyle  . Physical activity    Days per week: 0 days    Minutes per session: 0 min  . Stress: Rather much  Relationships  . Social Musicianconnections    Talks on phone: Not on file    Gets together: Not on file    Attends religious service: Never    Active member of club or organization: No    Attends meetings of clubs or organizations: Never    Relationship status: Married  Other Topics Concern  . Not on file  Social History Narrative  . Not on file    Allergies:  Allergies  Allergen Reactions  . Diphenhydramine Anaphylaxis  . Guaifenesin Swelling    Bodily Swelling  . Doxycycline     tachycardia  . Pork-Derived Products   . Amoxicillin Nausea Only    Other reaction(s): Other (See Comments) Increases anxiety Other reaction(s): Other (See Comments) Increases anxiety    Metabolic Disorder Labs: No results found for: HGBA1C, MPG No results found for: PROLACTIN No results found for: CHOL, TRIG, HDL, CHOLHDL, VLDL, LDLCALC Lab Results  Component Value Date   TSH 1.550 09/14/2018    Therapeutic Level Labs: No results found for: LITHIUM No results found for: VALPROATE No components found for:  CBMZ  Current Medications: Current Outpatient Medications  Medication Sig Dispense Refill  . acetaminophen (TYLENOL) 500 MG  tablet Take 1,000 mg by mouth every 6 (six) hours as needed for moderate pain.    Marland Kitchen. albuterol (PROVENTIL) (2.5 MG/3ML) 0.083% nebulizer solution Inhale 3 mLs (2.5 mg total) into the lungs every 6 (six) hours as needed for wheezing or shortness of breath. 75 mL 4  . ATROVENT HFA 17 MCG/ACT inhaler Inhale 2 puffs into the lungs every 6 (six) hours as needed for wheezing. 1 Inhaler 0  . BREO ELLIPTA 100-25 MCG/INH AEPB Inhale 1 puff into the lungs daily. 1 each 3  . clonazePAM (KLONOPIN) 0.5 MG tablet Take 0.5-1 tablets (0.25-0.5 mg total) by mouth as directed. Take half to  one tablet up to 3 times a week as needed for anxiety attacks 12 tablet 1  . COMBIVENT RESPIMAT 20-100 MCG/ACT AERS respimat INHALE 2 PUFFS INTO LUNGS EVERY 4 HOURS.  8 g 0  . divalproex (DEPAKOTE) 125 MG DR tablet Take 1 tablet (125 mg total) by mouth 3 (three) times daily. 90 tablet 1  . EPIPEN 2-PAK 0.3 MG/0.3ML SOAJ injection 0.3 mg.     . escitalopram (LEXAPRO) 20 MG tablet TAKE 1 TABLET BY MOUTH ONCE DAILY. 90 tablet 0  . fexofenadine (ALLEGRA) 180 MG tablet Take 180 mg by mouth daily as needed for allergies.     Marland Kitchen FLOVENT HFA 220 MCG/ACT inhaler     . fluticasone (FLONASE) 50 MCG/ACT nasal spray Place 1 spray into both nostrils daily.    . fluticasone (FLOVENT HFA) 220 MCG/ACT inhaler Swallow, two sprays in the morning and two sprays at nightime.    Marland Kitchen omeprazole (PRILOSEC) 20 MG capsule Take 20 mg by mouth 2 (two) times daily before a meal.     . OXYGEN Inhale into the lungs. 2LITERS AT NIGHT AND WHEN SICK    . PROAIR HFA 108 (90 Base) MCG/ACT inhaler INAHLE 2 PUFFS BY MOUTH EVERY 4 TO 6 HOURS AS NEEDED 8.5 g 0  . propranolol (INDERAL) 10 MG tablet Take 1 tablet (10 mg total) by mouth 2 (two) times daily as needed. FOR SEVERE ANXIETY ATTACKS 60 tablet 1   No current facility-administered medications for this visit.      Musculoskeletal: Strength & Muscle Tone: UTA Gait & Station: Reports as WNL Patient leans:  N/A  Psychiatric Specialty Exam: Review of Systems  Psychiatric/Behavioral: The patient is nervous/anxious.   All other systems reviewed and are negative.   There were no vitals taken for this visit.There is no height or weight on file to calculate BMI.  General Appearance: UTA  Eye Contact:  UTA  Speech:  Clear and Coherent  Volume:  Normal  Mood:  Anxious  Affect:  UTA  Thought Process:  Goal Directed and Descriptions of Associations: Intact  Orientation:  Full (Time, Place, and Person)  Thought Content: Logical   Suicidal Thoughts:  No  Homicidal Thoughts:  No  Memory:  Immediate;   Fair Recent;   Fair Remote;   Fair  Judgement:  Fair  Insight:  Fair  Psychomotor Activity:  UTA  Concentration:  Concentration: Fair and Attention Span: Fair  Recall:  [employer] of Knowledge: Fair  Language: Fair  Akathisia:  No  Handed:  Right  AIMS (if indicated): Denies tremors, rigidity  Assets:  Communication Skills Desire for Improvement Housing Social Support  ADL's:  Intact  Cognition: WNL  Sleep:  Fair   Screenings: PHQ2-9     Office Visit from 07/10/2018 in [facility], [facility] Office Visit from 01/18/2018 in [facility] PULMON[facility] OTHER RESP ORIENTATION from 02/09/2016 in [facility]  PHQ-2 Total Score  6  2  5   PHQ-9 Total Score  8  -  23       Assessment and Plan: Darcy is a 38 year old male, married, lives in [place] , has a history of pulmonary fibrosis, esophagitis, GAD, panic attacks, MDD, COPD, asthma, tachycardia was evaluated by telemedicine today.  He is biologically predisposed given his family history as well as multiple health issues.  Patient continues to be noncompliant with treatment recommendation however has been making progress.  Plan MDD-stable Lexapro as prescribed Depakote 125 mg 3 times daily-patient however has been taking only once a day dosage however agrees to increase the dosage. Pending  labs-Depakote level, CMP, CBC  GAD-unstable Lexapro as prescribed Depakote 125 mg 3 times  a day-however he has been taking only once a day dosage. Klonopin 0.25-0.5 mg 2-3 times a week as needed for severe anxiety attacks  Panic attacks-improving Propranolol 10 mg p.o. twice daily as needed-patient is noncompliant Klonopin as prescribed-currently being tapered off  Pending labs-Depakote level.  Patient encouraged to get labs done.  Patient to continue psychotherapy sessions with Ms. Alden Hipp  Follow-up in clinic in 1 month or sooner if needed.  December 8 at 3 PM  I have spent atleast 15 minutes non face to face with patient today. More than 50 % of the time was spent for psychoeducation and supportive psychotherapy and care coordination. This note was generated in part or whole with voice recognition software. Voice recognition is usually quite accurate but there are transcription errors that can and very often do occur. I apologize for any typographical errors that were not detected and corrected.       Ursula Alert, MD 09/10/2019, 5:36 PM

## 2019-09-12 IMAGING — DX DG HAND COMPLETE 3+V*L*
3 series · 3 of 3 positions shown · non-contrast
Comparison: None.

CLINICAL DATA: Laceration to the left hand near the thumb area with
a razor blade last night around 2467 hours.

EXAM:
LEFT HAND - COMPLETE 3+ VIEW

[hand pa]
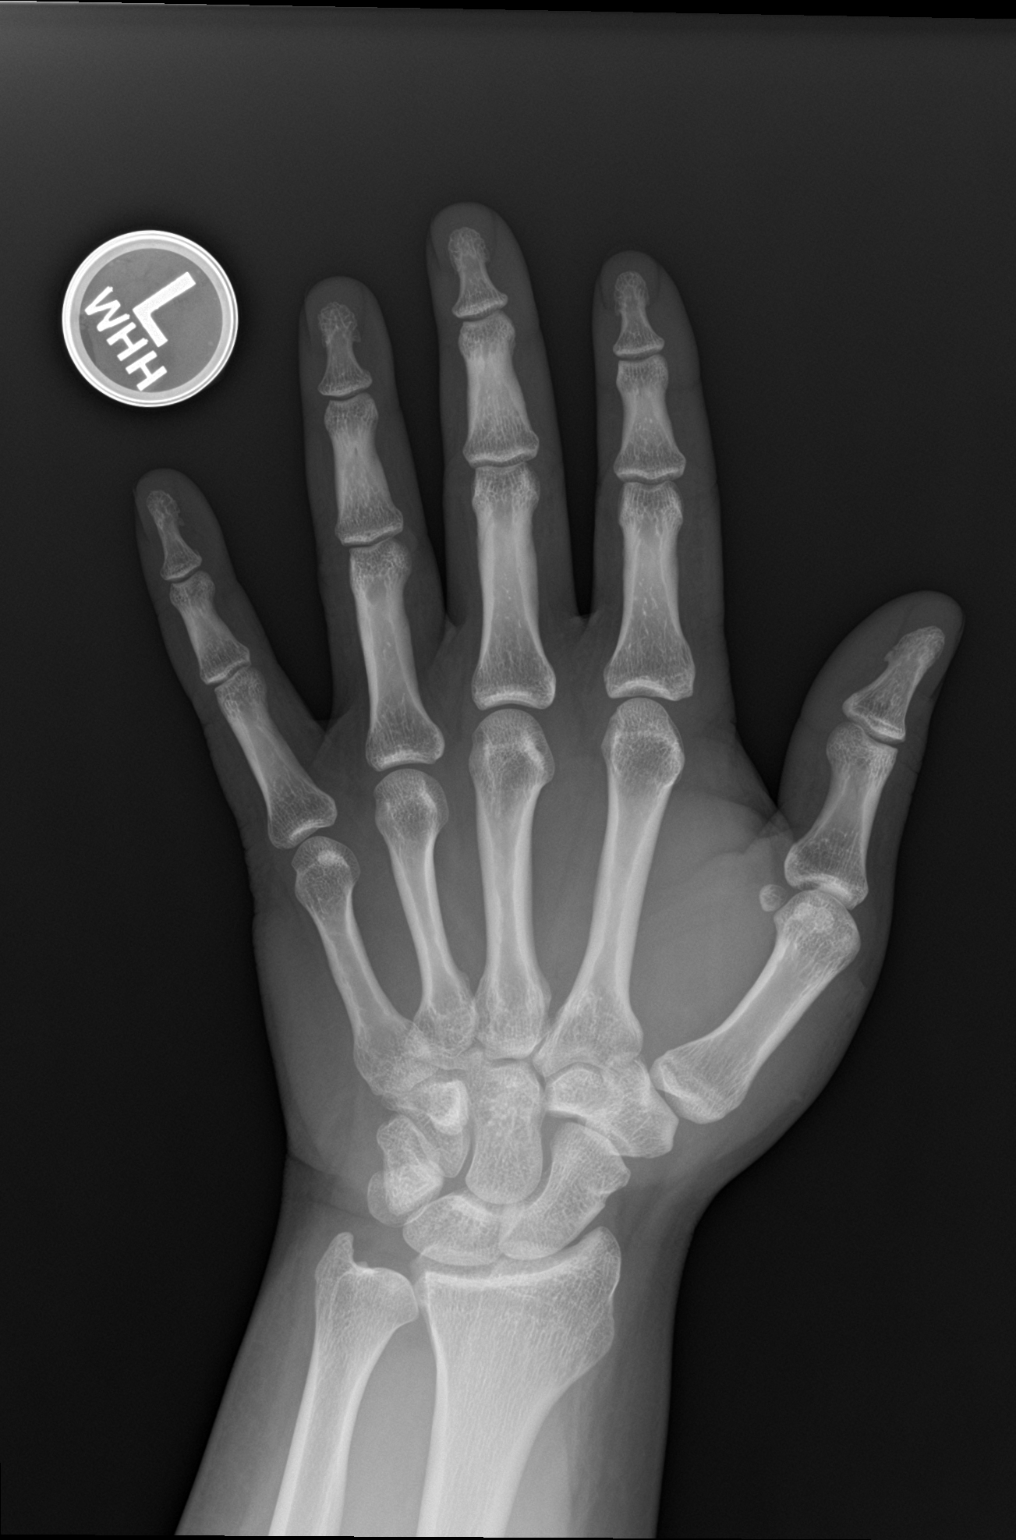

[hand obl]
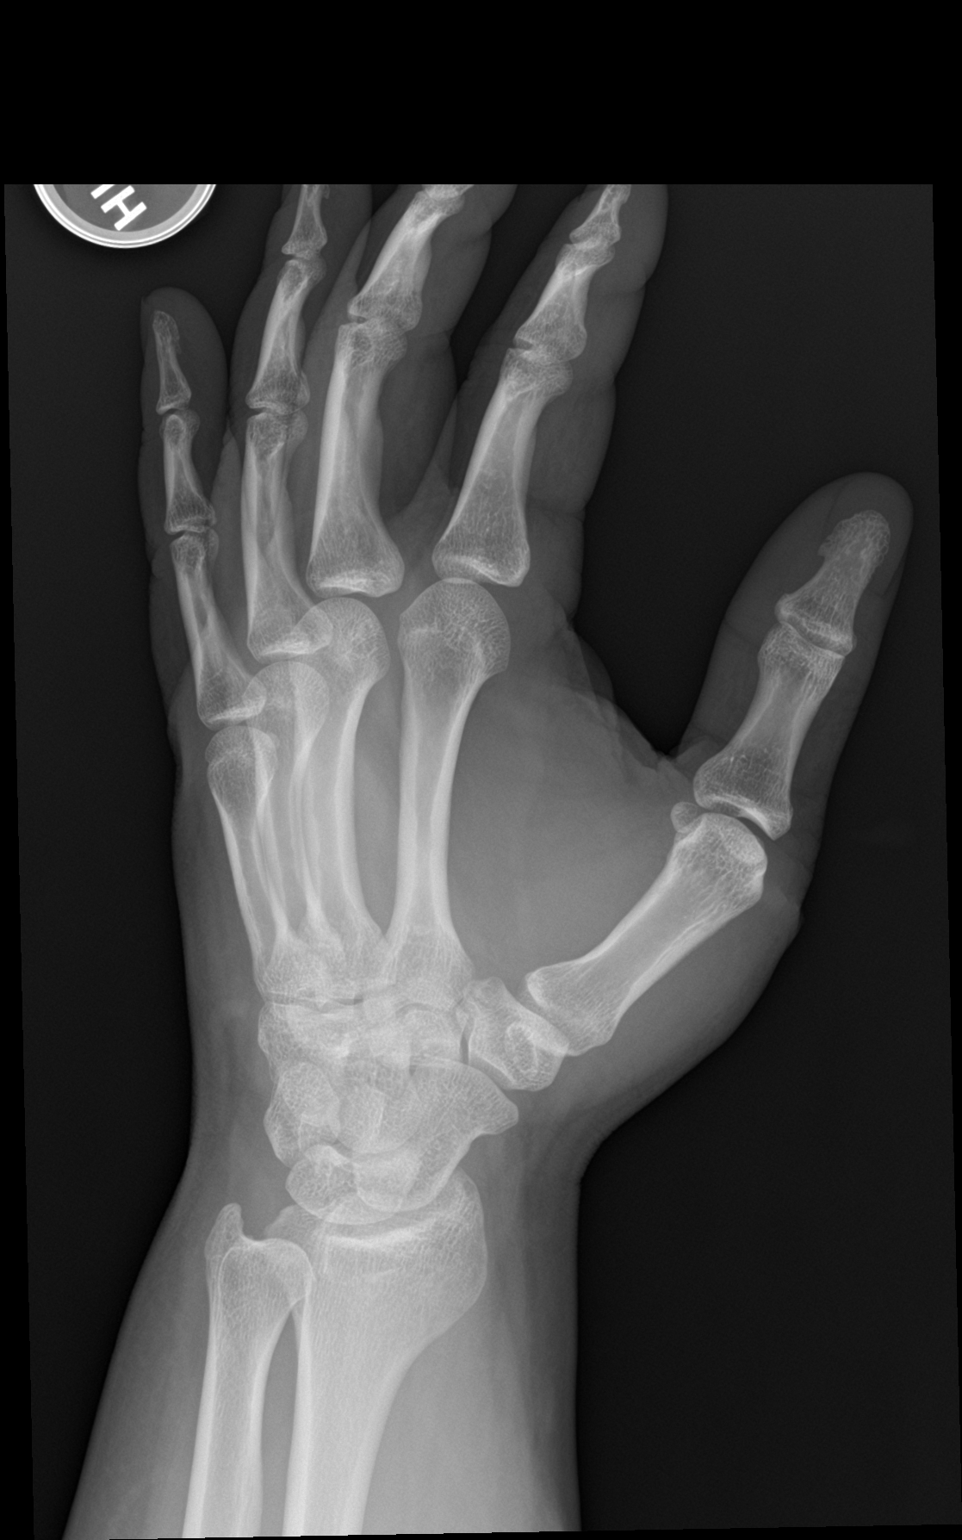

[hand lat]
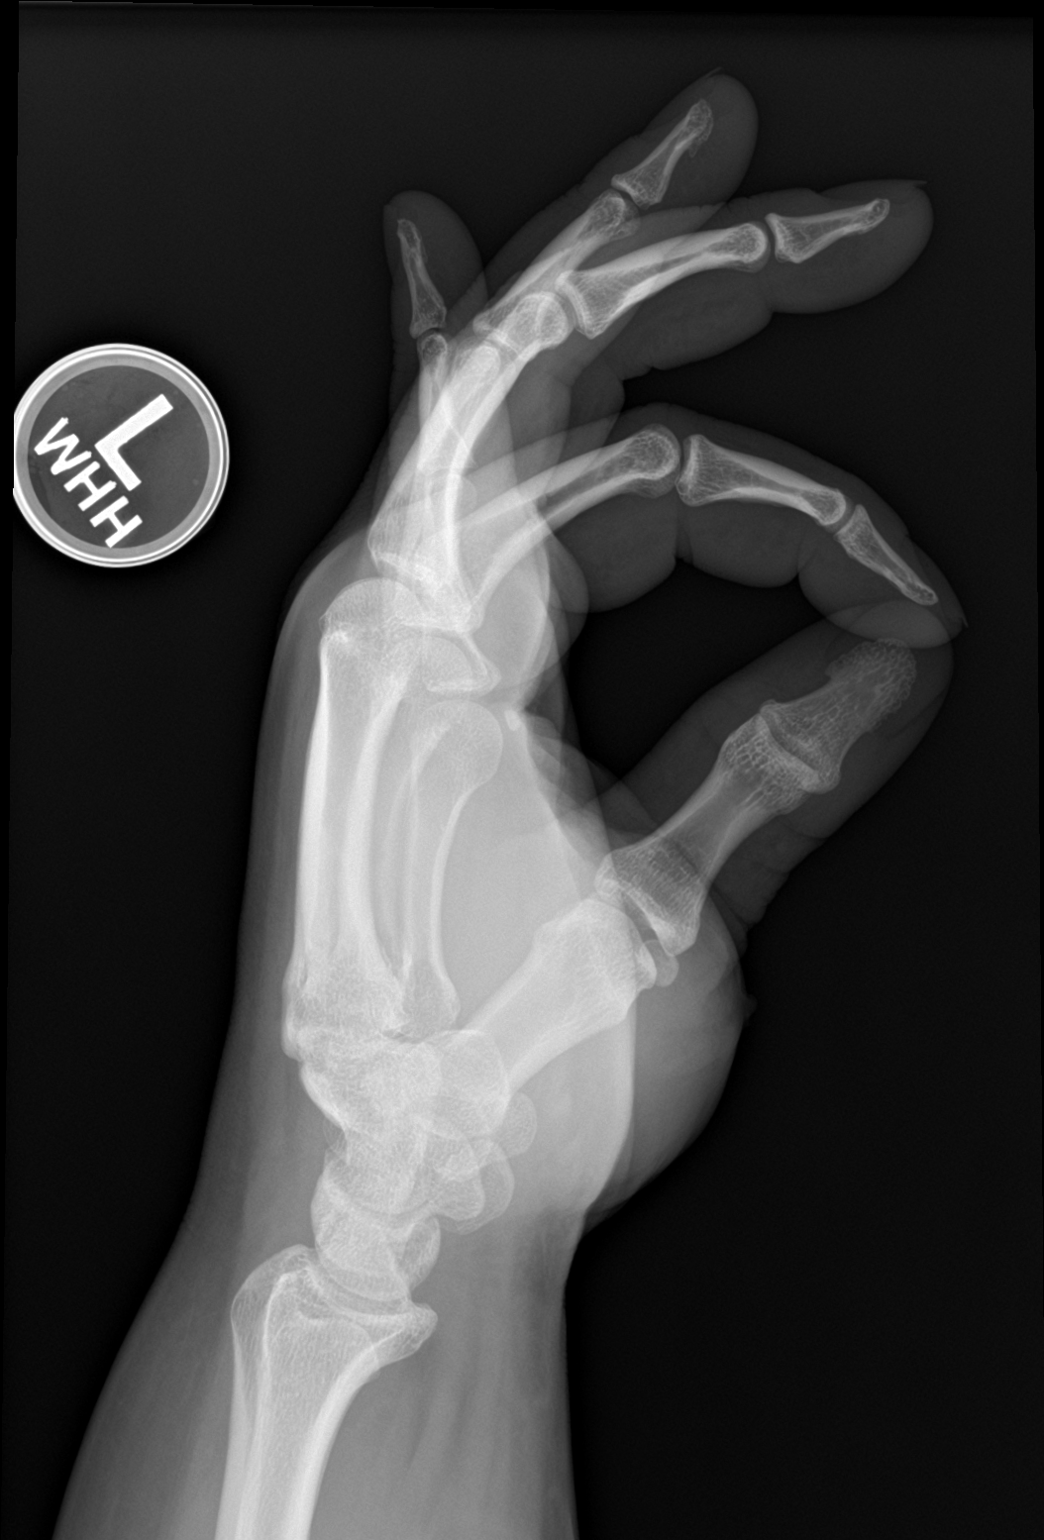

[3 of 3 positions shown; findings below may reference images not displayed]

FINDINGS: There is no evidence of fracture or dislocation. There is no
evidence of arthropathy or other focal bone abnormality. Soft
tissues are unremarkable. No radiopaque soft tissue foreign bodies
or gas collections identified.
IMPRESSION: No acute bony abnormalities. No radiopaque soft tissue foreign
bodies.

## 2019-09-16 ENCOUNTER — Telehealth: Payer: Self-pay

## 2019-09-16 NOTE — Telephone Encounter (Signed)
Confirmed appointment with patient. klh °

## 2019-09-18 ENCOUNTER — Ambulatory Visit: Payer: Medicare Other | Admitting: Internal Medicine

## 2019-09-30 ENCOUNTER — Telehealth: Payer: Self-pay

## 2019-09-30 ENCOUNTER — Ambulatory Visit: Payer: Medicare Other | Admitting: Licensed Clinical Social Worker

## 2019-09-30 ENCOUNTER — Other Ambulatory Visit: Payer: Self-pay

## 2019-09-30 NOTE — Telephone Encounter (Signed)
Called lmom informing patient of appointment. klh 

## 2019-10-02 ENCOUNTER — Ambulatory Visit: Payer: Medicare Other | Admitting: Internal Medicine

## 2019-10-04 ENCOUNTER — Other Ambulatory Visit: Payer: Self-pay

## 2019-10-04 MED ORDER — COMBIVENT RESPIMAT 20-100 MCG/ACT IN AERS: INHALATION_SPRAY | RESPIRATORY_TRACT | 2 refills | Status: DC

## 2019-10-07 ENCOUNTER — Telehealth: Payer: Self-pay

## 2019-10-07 NOTE — Telephone Encounter (Signed)
Confirmed patient appt for 10/09/19. beth °

## 2019-10-08 ENCOUNTER — Ambulatory Visit: Payer: Medicare Other | Admitting: Psychiatry

## 2019-10-09 ENCOUNTER — Ambulatory Visit: Payer: Medicare Other | Admitting: Internal Medicine

## 2019-10-09 ENCOUNTER — Telehealth: Payer: Self-pay

## 2019-10-09 NOTE — Telephone Encounter (Signed)
Patient is sick needed to reschedule pft on 10/10/2019 to 11/24/2018. klh

## 2019-10-14 ENCOUNTER — Ambulatory Visit: Payer: Medicare Other | Admitting: Internal Medicine

## 2019-10-21 IMAGING — CR DG CHEST 2V
1 series · 2 of 2 positions shown · non-contrast
Comparison: 05/21/2018 and earlier.

CLINICAL DATA: 36-year-old male with chest pain and shortness of
breath for 1 month.

EXAM:
CHEST - 2 VIEW

[Series 1: dg chest 2 view · 0.14mm/px · 2 of 2 slices shown]
[im 1/2]
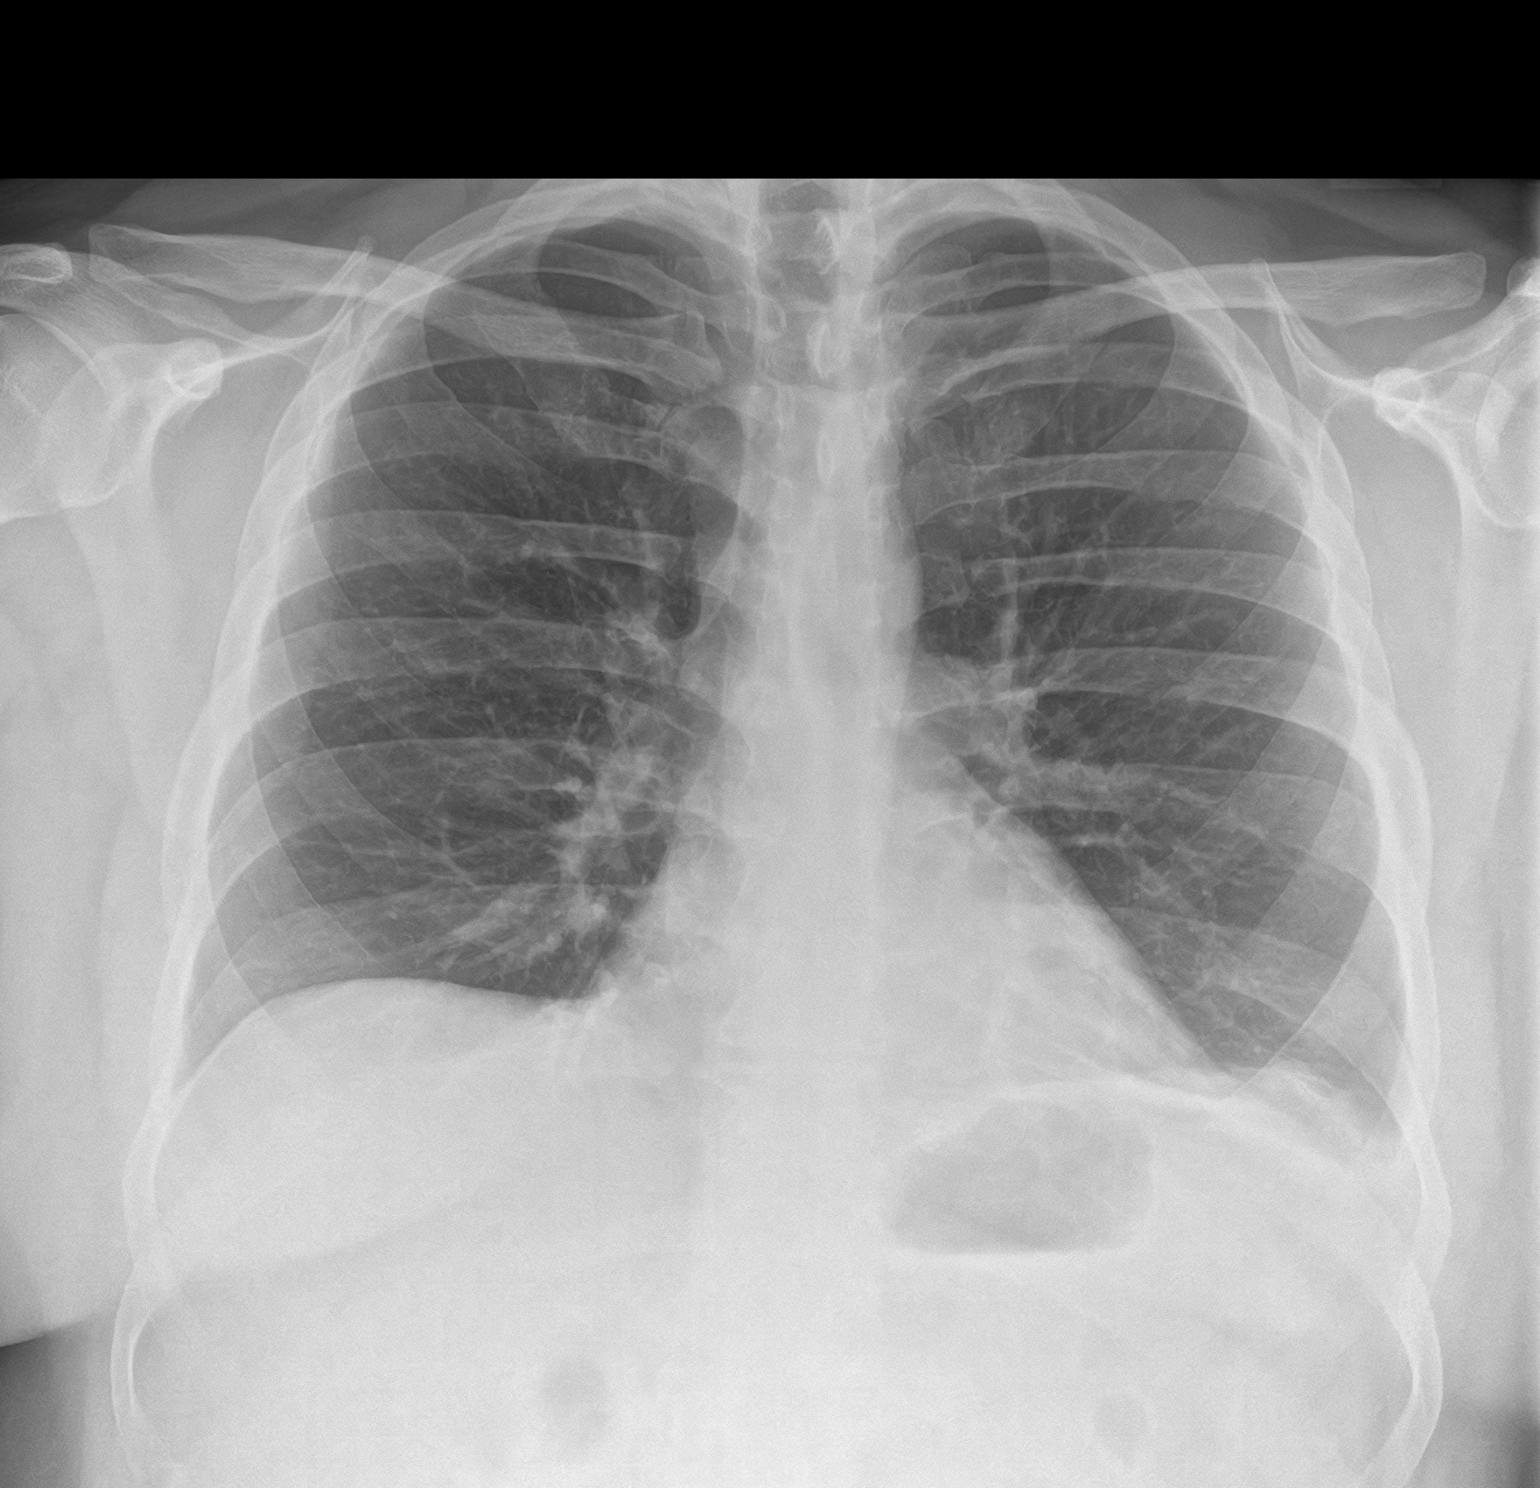
[im 2/2]
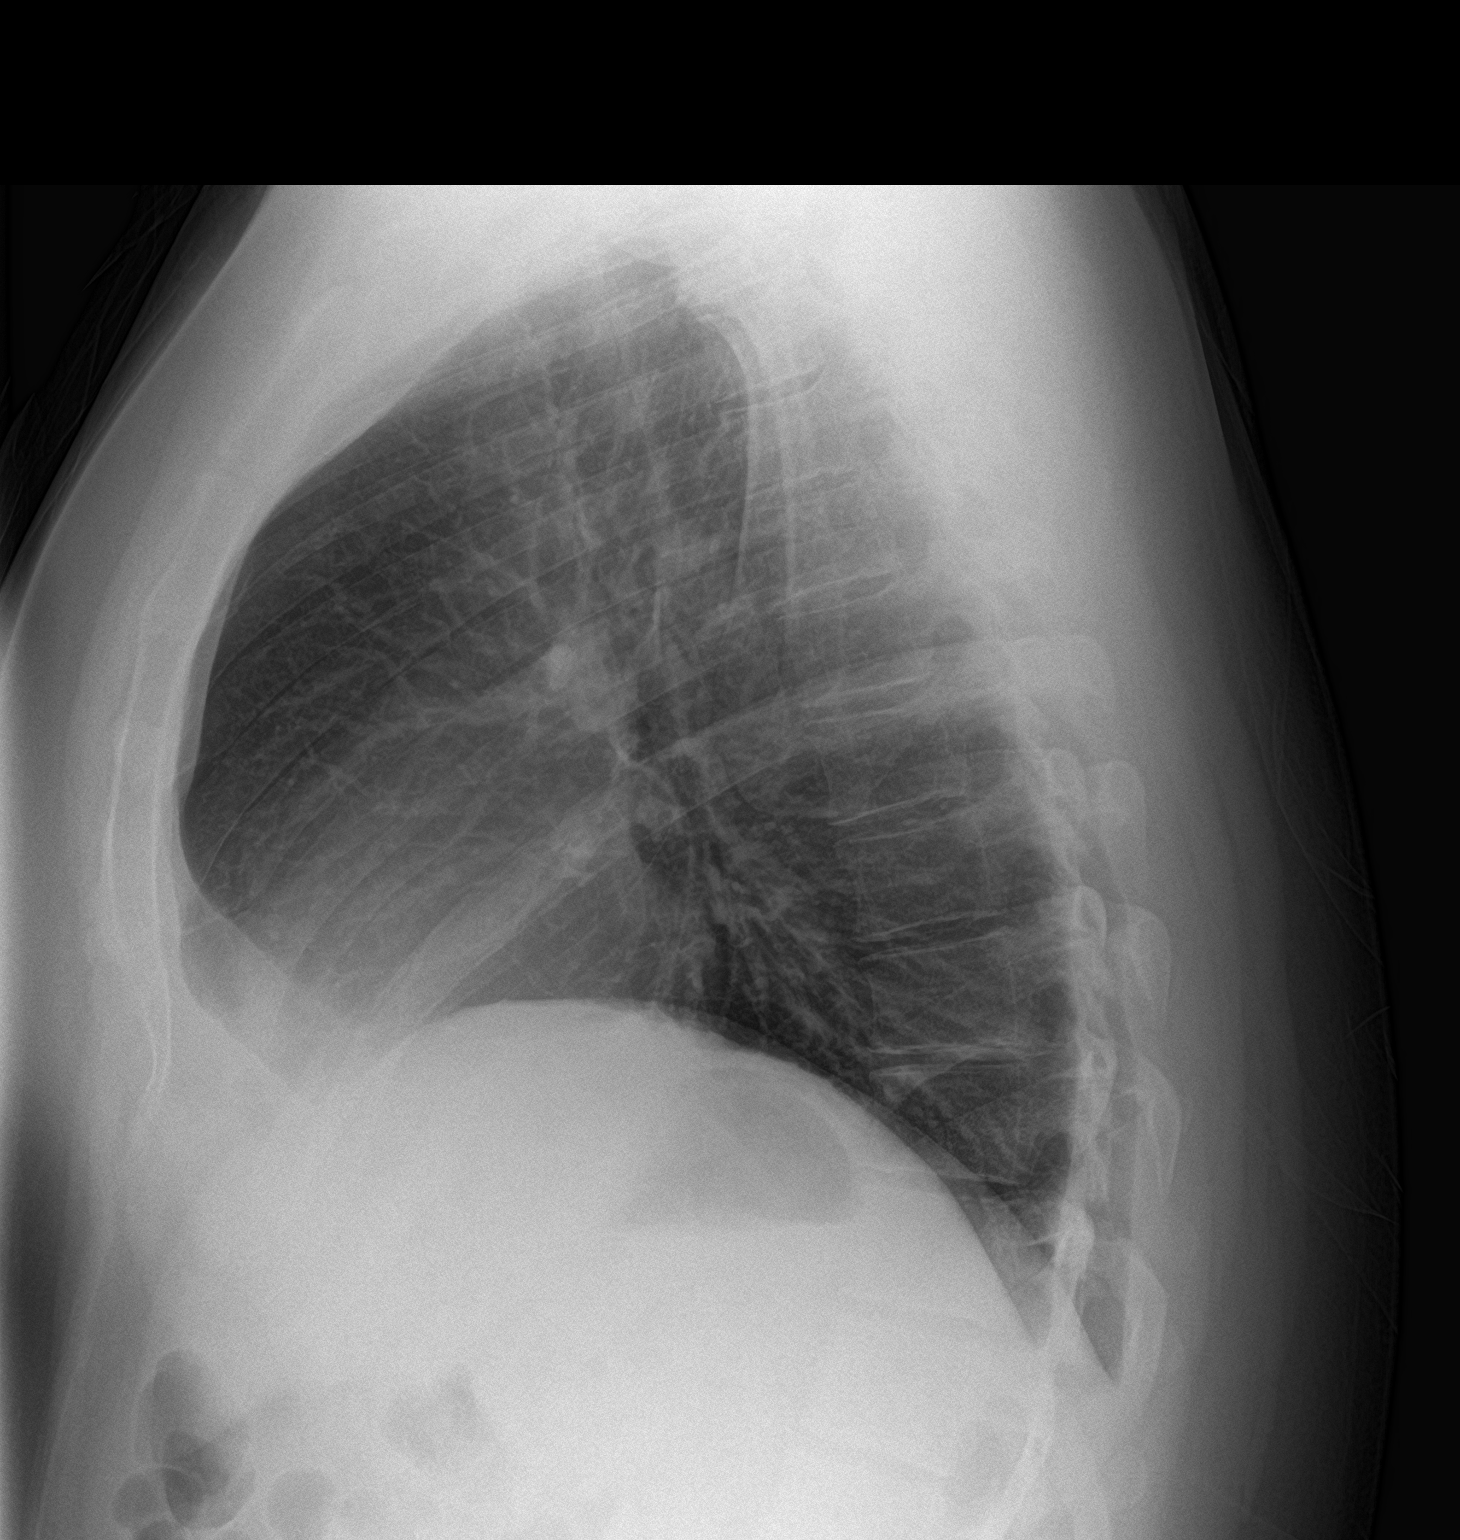

[2 of 2 positions shown; findings below may reference images not displayed]

FINDINGS: Mildly increased lung volumes. Mild left lung base chronic scarring
or atelectasis is less apparent. Mediastinal contours remain normal.
Visualized tracheal air column is within normal limits. No
pneumothorax, pulmonary edema, pleural effusion or acute pulmonary
opacity. No acute osseous abnormality identified. Negative visible
bowel gas pattern.
IMPRESSION: No acute cardiopulmonary abnormality.

## 2019-10-30 ENCOUNTER — Other Ambulatory Visit: Payer: Self-pay

## 2019-10-30 ENCOUNTER — Ambulatory Visit: Payer: Medicare Other | Admitting: Licensed Clinical Social Worker

## 2019-11-11 ENCOUNTER — Telehealth: Payer: Self-pay

## 2019-11-11 NOTE — Telephone Encounter (Signed)
Confirmed appointment with patient. klh °

## 2019-11-12 ENCOUNTER — Telehealth: Payer: Self-pay

## 2019-11-12 NOTE — Telephone Encounter (Signed)
Called lmom informing patient need to reschedule pft and results to 12/2019. klh

## 2019-11-13 ENCOUNTER — Ambulatory Visit: Payer: Medicare Other | Admitting: Internal Medicine

## 2019-11-21 ENCOUNTER — Telehealth: Payer: Self-pay

## 2019-11-21 NOTE — Telephone Encounter (Signed)
Patient rescheduled appointment on 11/25/2019 to 12/19/2019. klh

## 2019-11-25 ENCOUNTER — Ambulatory Visit: Payer: Medicare Other | Admitting: Internal Medicine

## 2019-12-04 ENCOUNTER — Ambulatory Visit: Payer: Medicare Other | Admitting: Internal Medicine

## 2019-12-15 ENCOUNTER — Other Ambulatory Visit: Payer: Self-pay

## 2019-12-15 ENCOUNTER — Encounter: Payer: Self-pay | Admitting: *Deleted

## 2019-12-15 ENCOUNTER — Emergency Department: Payer: Medicare Other

## 2019-12-15 ENCOUNTER — Observation Stay
Admission: EM | Admit: 2019-12-15 | Discharge: 2019-12-17 | Disposition: A | Discharge status: Home / Self Care | Payer: Medicare Other | Attending: Internal Medicine | Admitting: Internal Medicine

## 2019-12-15 DIAGNOSIS — Z6841 Body Mass Index (BMI) 40.0 and over, adult: Secondary | ICD-10-CM | POA: Insufficient documentation

## 2019-12-15 DIAGNOSIS — K2100 Gastro-esophageal reflux disease with esophagitis, without bleeding: Secondary | ICD-10-CM | POA: Diagnosis not present

## 2019-12-15 DIAGNOSIS — F411 Generalized anxiety disorder: Secondary | ICD-10-CM | POA: Insufficient documentation

## 2019-12-15 DIAGNOSIS — T18128A Food in esophagus causing other injury, initial encounter: Principal | ICD-10-CM | POA: Insufficient documentation

## 2019-12-15 DIAGNOSIS — Z7951 Long term (current) use of inhaled steroids: Secondary | ICD-10-CM | POA: Insufficient documentation

## 2019-12-15 DIAGNOSIS — Z881 Allergy status to other antibiotic agents status: Secondary | ICD-10-CM | POA: Diagnosis not present

## 2019-12-15 DIAGNOSIS — E669 Obesity, unspecified: Secondary | ICD-10-CM | POA: Diagnosis not present

## 2019-12-15 DIAGNOSIS — T18198A Other foreign object in esophagus causing other injury, initial encounter: Secondary | ICD-10-CM | POA: Diagnosis not present

## 2019-12-15 DIAGNOSIS — R1310 Dysphagia, unspecified: Secondary | ICD-10-CM | POA: Diagnosis not present

## 2019-12-15 DIAGNOSIS — T18108A Unspecified foreign body in esophagus causing other injury, initial encounter: Secondary | ICD-10-CM | POA: Diagnosis present

## 2019-12-15 DIAGNOSIS — Z87892 Personal history of anaphylaxis: Secondary | ICD-10-CM | POA: Diagnosis not present

## 2019-12-15 DIAGNOSIS — Z87891 Personal history of nicotine dependence: Secondary | ICD-10-CM | POA: Insufficient documentation

## 2019-12-15 DIAGNOSIS — Z79899 Other long term (current) drug therapy: Secondary | ICD-10-CM | POA: Insufficient documentation

## 2019-12-15 DIAGNOSIS — K222 Esophageal obstruction: Secondary | ICD-10-CM | POA: Insufficient documentation

## 2019-12-15 DIAGNOSIS — J449 Chronic obstructive pulmonary disease, unspecified: Secondary | ICD-10-CM | POA: Diagnosis not present

## 2019-12-15 DIAGNOSIS — Z20822 Contact with and (suspected) exposure to covid-19: Secondary | ICD-10-CM | POA: Insufficient documentation

## 2019-12-15 DIAGNOSIS — M1710 Unilateral primary osteoarthritis, unspecified knee: Secondary | ICD-10-CM | POA: Insufficient documentation

## 2019-12-15 DIAGNOSIS — J8410 Pulmonary fibrosis, unspecified: Secondary | ICD-10-CM | POA: Diagnosis not present

## 2019-12-15 DIAGNOSIS — F419 Anxiety disorder, unspecified: Secondary | ICD-10-CM | POA: Diagnosis not present

## 2019-12-15 DIAGNOSIS — K200 Eosinophilic esophagitis: Secondary | ICD-10-CM

## 2019-12-15 DIAGNOSIS — F329 Major depressive disorder, single episode, unspecified: Secondary | ICD-10-CM | POA: Insufficient documentation

## 2019-12-15 DIAGNOSIS — R0789 Other chest pain: Secondary | ICD-10-CM | POA: Insufficient documentation

## 2019-12-15 DIAGNOSIS — Z91018 Allergy to other foods: Secondary | ICD-10-CM | POA: Insufficient documentation

## 2019-12-15 DIAGNOSIS — X58XXXA Exposure to other specified factors, initial encounter: Secondary | ICD-10-CM | POA: Diagnosis not present

## 2019-12-15 DIAGNOSIS — Z880 Allergy status to penicillin: Secondary | ICD-10-CM | POA: Diagnosis not present

## 2019-12-15 HISTORY — DX: Diaphragmatic hernia without obstruction or gangrene: K44.9

## 2019-12-15 HISTORY — DX: Eosinophilic esophagitis: K20.0

## 2019-12-15 LAB — BASIC METABOLIC PANEL
Anion gap: 7 (ref 5–15)
BUN: <5 mg/dL — ABNORMAL LOW (ref 6–20)
CO2: 27 mmol/L (ref 22–32)
Calcium: 9.8 mg/dL (ref 8.9–10.3)
Chloride: 106 mmol/L (ref 98–111)
Creatinine, Ser: 0.86 mg/dL (ref 0.61–1.24)
GFR calc Af Amer: >60 mL/min (ref >60)
GFR calc non Af Amer: >60 mL/min (ref >60)
Glucose, Bld: 157 mg/dL — ABNORMAL HIGH (ref 70–99)
Potassium: 3.9 mmol/L (ref 3.5–5.1)
Sodium: 140 mmol/L (ref 135–145)

## 2019-12-15 LAB — RESPIRATORY PANEL BY RT PCR (FLU A&B, COVID)
Influenza A by PCR: NEGATIVE
Influenza B by PCR: NEGATIVE
SARS Coronavirus 2 by RT PCR: NEGATIVE

## 2019-12-15 LAB — CBC WITH DIFFERENTIAL/PLATELET
Abs Immature Granulocytes: 0.07 10*3/uL (ref 0.00–0.07)
Basophils Absolute: 0.1 10*3/uL (ref 0.0–0.1)
Basophils Relative: 0 %
Eosinophils Absolute: 0.2 10*3/uL (ref 0.0–0.5)
Eosinophils Relative: 1 %
HCT: 46 % (ref 39.0–52.0)
Hemoglobin: 15.6 g/dL (ref 13.0–17.0)
Immature Granulocytes: 0 %
Lymphocytes Relative: 9 %
Lymphs Abs: 1.7 10*3/uL (ref 0.7–4.0)
MCH: 29.1 pg (ref 26.0–34.0)
MCHC: 33.9 g/dL (ref 30.0–36.0)
MCV: 85.8 fL (ref 80.0–100.0)
Monocytes Absolute: 0.7 10*3/uL (ref 0.1–1.0)
Monocytes Relative: 4 %
Neutro Abs: 16 10*3/uL — ABNORMAL HIGH (ref 1.7–7.7)
Neutrophils Relative %: 86 %
Platelets: 407 10*3/uL — ABNORMAL HIGH (ref 150–400)
RBC: 5.36 MIL/uL (ref 4.22–5.81)
RDW: 12.4 % (ref 11.5–15.5)
WBC: 18.7 10*3/uL — ABNORMAL HIGH (ref 4.0–10.5)
nRBC: 0 % (ref 0.0–0.2)

## 2019-12-15 MED ORDER — DIVALPROEX SODIUM 125 MG PO DR TAB
125.0000 mg | DELAYED_RELEASE_TABLET | Freq: Three times a day (TID) | ORAL | Status: DC
  Filled 2019-12-15 (×7): qty 1

## 2019-12-15 MED ORDER — IPRATROPIUM-ALBUTEROL 0.5-2.5 (3) MG/3ML IN SOLN
3.0000 mL | Freq: Four times a day (QID) | RESPIRATORY_TRACT | Status: DC
  Administered 2019-12-16 – 2019-12-17 (×4): 3 mL via RESPIRATORY_TRACT
  Filled 2019-12-15 (×6): qty 3

## 2019-12-15 MED ORDER — FLUTICASONE FUROATE-VILANTEROL 100-25 MCG/INH IN AEPB
1.0000 | INHALATION_SPRAY | Freq: Every day | RESPIRATORY_TRACT | Status: DC
  Administered 2019-12-16 – 2019-12-17 (×2): 1 via RESPIRATORY_TRACT
  Filled 2019-12-15: qty 28

## 2019-12-15 MED ORDER — GLUCAGON HCL RDNA (DIAGNOSTIC) 1 MG IJ SOLR: 1.0000 mg | Freq: Once | INTRAMUSCULAR | Status: AC

## 2019-12-15 MED ORDER — ESCITALOPRAM OXALATE 10 MG PO TABS
20.0000 mg | ORAL_TABLET | Freq: Every day | ORAL | Status: DC
  Administered 2019-12-17: 10:00:00 20 mg via ORAL
  Filled 2019-12-15 (×2): qty 2

## 2019-12-15 MED ORDER — ONDANSETRON HCL 4 MG/2ML IJ SOLN: 4.0000 mg | Freq: Four times a day (QID) | INTRAMUSCULAR | Status: DC | PRN

## 2019-12-15 MED ORDER — SODIUM CHLORIDE 0.9 % IV SOLN: INTRAVENOUS | Status: DC

## 2019-12-15 MED ORDER — FLUTICASONE PROPIONATE 50 MCG/ACT NA SUSP
1.0000 | Freq: Every day | NASAL | Status: DC
  Administered 2019-12-16 – 2019-12-17 (×2): 1 via NASAL
  Filled 2019-12-15: qty 16

## 2019-12-15 MED ORDER — GLUCAGON HCL RDNA (DIAGNOSTIC) 1 MG IJ SOLR
INTRAMUSCULAR | Status: AC
  Administered 2019-12-15: 20:00:00 1 mg via INTRAMUSCULAR
  Filled 2019-12-15: qty 1

## 2019-12-15 MED ORDER — ALBUTEROL SULFATE (2.5 MG/3ML) 0.083% IN NEBU
2.5000 mg | INHALATION_SOLUTION | Freq: Four times a day (QID) | RESPIRATORY_TRACT | Status: DC | PRN
  Administered 2019-12-17: 01:00:00 2.5 mg via RESPIRATORY_TRACT

## 2019-12-15 MED ORDER — TRAZODONE HCL 50 MG PO TABS: 25.0000 mg | ORAL_TABLET | Freq: Every evening | ORAL | Status: DC | PRN

## 2019-12-15 MED ORDER — CLONAZEPAM 0.5 MG PO TABS: 0.2500 mg | ORAL_TABLET | ORAL | Status: DC

## 2019-12-15 MED ORDER — ACETAMINOPHEN 325 MG PO TABS
650.0000 mg | ORAL_TABLET | Freq: Four times a day (QID) | ORAL | Status: DC | PRN
  Administered 2019-12-16 – 2019-12-17 (×2): 650 mg via ORAL
  Filled 2019-12-15 (×2): qty 2

## 2019-12-15 MED ORDER — ONDANSETRON HCL 4 MG PO TABS: 4.0000 mg | ORAL_TABLET | Freq: Four times a day (QID) | ORAL | Status: DC | PRN

## 2019-12-15 MED ORDER — FLUTICASONE PROPIONATE HFA 220 MCG/ACT IN AERO
2.0000 | INHALATION_SPRAY | Freq: Two times a day (BID) | RESPIRATORY_TRACT | Status: DC
  Administered 2019-12-16 – 2019-12-17 (×3): 2 via RESPIRATORY_TRACT
  Filled 2019-12-15: qty 12

## 2019-12-15 MED ORDER — MAGNESIUM HYDROXIDE 400 MG/5ML PO SUSP: 30.0000 mL | Freq: Every day | ORAL | Status: DC | PRN

## 2019-12-15 MED ORDER — IPRATROPIUM BROMIDE 0.02 % IN SOLN: 2.5000 mL | Freq: Four times a day (QID) | RESPIRATORY_TRACT | Status: DC | PRN

## 2019-12-15 MED ORDER — LORATADINE 10 MG PO TABS
10.0000 mg | ORAL_TABLET | Freq: Every day | ORAL | Status: DC
  Administered 2019-12-17: 10:00:00 10 mg via ORAL
  Filled 2019-12-15 (×2): qty 1

## 2019-12-15 MED ORDER — ACETAMINOPHEN 650 MG RE SUPP: 650.0000 mg | Freq: Four times a day (QID) | RECTAL | Status: DC | PRN

## 2019-12-15 MED ORDER — ENOXAPARIN SODIUM 40 MG/0.4ML ~~LOC~~ SOLN: 40.0000 mg | SUBCUTANEOUS | Status: DC

## 2019-12-15 MED ORDER — SODIUM CHLORIDE 0.9 % IV BOLUS
1000.0000 mL | Freq: Once | INTRAVENOUS | Status: AC
  Administered 2019-12-15: 22:00:00 1000 mL via INTRAVENOUS

## 2019-12-15 MED ORDER — PANTOPRAZOLE SODIUM 40 MG IV SOLR
40.0000 mg | Freq: Once | INTRAVENOUS | Status: AC
  Administered 2019-12-15: 22:00:00 40 mg via INTRAVENOUS
  Filled 2019-12-15: qty 40

## 2019-12-15 NOTE — ED Triage Notes (Signed)
Pt to ED reporting 1 hour ago he took an allgra pill that is stuck in throat pt unable to drink without vomiting and Is spitting into a bottle in triage. Pt has had a food bolus of chicken and went for an endosopy

## 2019-12-15 NOTE — H&P (Addendum)
at [facility]   PATIENT NAME: Steven [name]    MR#:  [id number]  DATE OF BIRTH:  1980-11-26  DATE OF ADMISSION:  12/15/2019  PRIMARY CARE PHYSICIAN: [facility]   REQUESTING/REFERRING PHYSICIAN: Dionne Bucy, MD  CHIEF COMPLAINT:   Chief Complaint  Patient presents with  . Food Bolus    HISTORY OF PRESENT ILLNESS:  Steven [name]  is a 39 y.o. Caucasian male with a known history of asthma, COPD, and anxiety, depression and panic attacks, who presented to the emergency room with acute onset of dysphagia after taking Allegra-D pill an hour before he presented to the ER.  He stated that he has been having sinus congestion over the last 4 to 5 months and has taken Allegra-D before including the last 4 days.  He stated that he had dysphagia like this with inability to swallow a piece of chicken in the past.  He underwent esophageal dilatation twice.  He denied any fever or chills or nausea or vomiting.  He admitted to mild epigastric heartburn.  No melena or bright red bleeding per rectum.  No chest pain or palpitations.  Upon presentation to the emergency room, heart rate was 110 with otherwise normal vital signs.  Labs revealed significant cytosis of 18.7 with neutrophilia with a blood glucose of 157 and otherwise unremarkable CMP.  Two-view chest x-ray showed no acute cardiopulmonary disease.  The patient was given 1 mg of IM glucagon, 40 mg of IV Protonix and 1 L bolus of IV normal saline.  Dr. Allegra Lai was contacted by ER physician who recommended observation overnight.  He will be admitted to an observation medical bed for further evaluation and management. PAST MEDICAL HISTORY:   Past Medical History:  Diagnosis Date  . Anxiety   . Arthritis    knee  . Asthma   . Chronic mental illness   . COPD (chronic obstructive pulmonary disease) (HCC)   . Depression   . GERD (gastroesophageal reflux disease)   . Heart palpitations    . Panic attack   . Pulmonary fibrosis (HCC)   -Seasonal allergies  PAST SURGICAL HISTORY:   Past Surgical History:  Procedure Laterality Date  . ESOPHAGOGASTRODUODENOSCOPY N/A 02/18/2019   Procedure: ESOPHAGOGASTRODUODENOSCOPY (EGD);  Surgeon: Toledo, Boykin Nearing, MD;  Location: [facility] ENDOSCOPY;  Service: Gastroenterology;  Laterality: N/A;  . ESOPHAGOGASTRODUODENOSCOPY (EGD) WITH PROPOFOL N/A 09/05/2017   Procedure: ESOPHAGOGASTRODUODENOSCOPY (EGD) WITH PROPOFOL;  Surgeon: Toledo, Boykin Nearing, MD;  Location: [facility] ENDOSCOPY;  Service: Gastroenterology;  Laterality: N/A;  . none    . TUBES IN EARS    -Esophageal dilatation twice. -Right hand and wrist surgery. -Hiatal hernia repair SOCIAL HISTORY:   Social History   Tobacco Use  . Smoking status: Former Smoker    Packs/day: 0.50    Years: 2.00    Pack years: 1.00    Types: Cigarettes    Quit date: 10/31/2000    Years since quitting: 19.1  . Smokeless tobacco: Current User    Types: Chew  . Tobacco comment: 2003  Substance Use Topics  . Alcohol use: No    Alcohol/week: 0.0 standard drinks    FAMILY HISTORY:   Family History  Problem Relation Age of Onset  . COPD Mother   . Asthma Mother   . Mental illness Mother   . COPD Father   . Pulmonary fibrosis Father   . Lung cancer Father     DRUG ALLERGIES:   Allergies  Allergen Reactions  . Diphenhydramine Anaphylaxis  . Guaifenesin Swelling    Bodily Swelling  . Doxycycline     tachycardia  . Pork-Derived Products   . Amoxicillin Nausea Only    Other reaction(s): Other (See Comments) Increases anxiety Other reaction(s): Other (See Comments) Increases anxiety    REVIEW OF SYSTEMS:   As per history of present illness. All pertinent systems were reviewed above. Constitutional,  HEENT, cardiovascular, respiratory, GI, GU, musculoskeletal, neuro, psychiatric, endocrine,  integumentary and hematologic systems were reviewed and are otherwise  negative/unremarkable  except for positive findings mentioned above in the HPI.   MEDICATIONS AT HOME:   Prior to Admission medications   Medication Sig Start Date End Date Taking? Authorizing Provider  acetaminophen (TYLENOL) 500 MG tablet Take 1,000 mg by mouth every 6 (six) hours as needed for moderate pain.    [provider]  albuterol (PROVENTIL) (2.5 MG/3ML) 0.083% nebulizer solution Inhale 3 mLs (2.5 mg total) into the lungs every 6 (six) hours as needed for wheezing or shortness of breath. 07/10/18   Johnna Acosta, NP  ATROVENT HFA 17 MCG/ACT inhaler Inhale 2 puffs into the lungs every 6 (six) hours as needed for wheezing. 07/10/18   Scarboro, Coralee North, NP  BREO ELLIPTA 100-25 MCG/INH AEPB Inhale 1 puff into the lungs daily. 07/10/18   Johnna Acosta, NP  clonazePAM (KLONOPIN) 0.5 MG tablet Take 0.5-1 tablets (0.25-0.5 mg total) by mouth as directed. Take half to  one tablet up to 3 times a week as needed for anxiety attacks 08/27/19 09/26/19  Jomarie Longs, MD  COMBIVENT RESPIMAT 20-100 MCG/ACT AERS respimat INHALE 2 PUFFS INTO LUNGS EVERY 4 HOURS. 10/04/19   Yevonne Pax, MD  divalproex (DEPAKOTE) 125 MG DR tablet Take 1 tablet (125 mg total) by mouth 3 (three) times daily. 05/08/19   Jomarie Longs, MD  EPIPEN 2-PAK 0.3 MG/0.3ML SOAJ injection 0.3 mg.  12/03/15   [provider]  escitalopram (LEXAPRO) 20 MG tablet TAKE 1 TABLET BY MOUTH ONCE DAILY. 07/16/19   Jomarie Longs, MD  fexofenadine (ALLEGRA) 180 MG tablet Take 180 mg by mouth daily as needed for allergies.     [provider]  FLOVENT HFA 220 MCG/ACT inhaler  03/21/19   [provider]  fluticasone (FLONASE) 50 MCG/ACT nasal spray Place 1 spray into both nostrils daily. 06/23/15   [provider]  fluticasone (FLOVENT HFA) 220 MCG/ACT inhaler Swallow, two sprays in the morning and two sprays at nightime. 03/21/19   [provider]  omeprazole (PRILOSEC) 20 MG capsule Take 20 mg by mouth 2 (two)  times daily before a meal.     [provider]  OXYGEN Inhale into the lungs. 2LITERS AT NIGHT AND WHEN SICK    [provider]  PROAIR HFA 108 (90 Base) MCG/ACT inhaler INAHLE 2 PUFFS BY MOUTH EVERY 4 TO 6 HOURS AS NEEDED 09/15/16   Mungal, Vishal, MD  propranolol (INDERAL) 10 MG tablet Take 1 tablet (10 mg total) by mouth 2 (two) times daily as needed. FOR SEVERE ANXIETY ATTACKS 03/04/19   Jomarie Longs, MD      VITAL SIGNS:  Blood pressure (!) 141/62, pulse 92, temperature 98.5 F (36.9 C), temperature source Oral, resp. rate 17, height 5\' 7"  (1.702 m), weight 116.6 kg, SpO2 98 %.  PHYSICAL EXAMINATION:  Physical Exam  GENERAL:  39 y.o.-year-old Caucasian male patient lying in the bed with no acute distress.  EYES: Pupils equal, round, reactive to  light and accommodation. No scleral icterus. Extraocular muscles intact.  HEENT: Head atraumatic, normocephalic. Oropharynx and nasopharynx clear.  NECK:  Supple, no jugular venous distention. No thyroid enlargement, no tenderness.  LUNGS: Normal breath sounds bilaterally, no wheezing, rales,rhonchi or crepitation. No use of accessory muscles of respiration.  CARDIOVASCULAR: Regular rate and rhythm, S1, S2 normal. No murmurs, rubs, or gallops.  ABDOMEN: Soft, nondistended with mild epigastric tenderness without rebound tenderness guarding or rigidity.  Bowel sounds present. No organomegaly or mass.  EXTREMITIES: No pedal edema, cyanosis, or clubbing.  NEUROLOGIC: Cranial nerves II through XII are intact. Muscle strength 5/5 in all extremities. Sensation intact. Gait not checked.  PSYCHIATRIC: The patient is alert and oriented x 3.  Normal affect and good eye contact. SKIN: No obvious rash, lesion, or ulcer.   LABORATORY PANEL:   CBC Recent Labs  Lab 12/15/19 2127  WBC 18.7*  HGB 15.6  HCT 46.0  PLT 407*    ------------------------------------------------------------------------------------------------------------------  Chemistries  Recent Labs  Lab 12/15/19 2127  NA 140  K 3.9  CL 106  CO2 27  GLUCOSE 157*  BUN <5*  CREATININE 0.86  CALCIUM 9.8   ------------------------------------------------------------------------------------------------------------------  Cardiac Enzymes No results for input(s): TROPONINI in the last 168 hours. ------------------------------------------------------------------------------------------------------------------  RADIOLOGY:  DG Chest 2 View  Result Date: 12/15/2019 CLINICAL DATA:  Chest discomfort dysphagia EXAM: CHEST - 2 VIEW COMPARISON:  None. FINDINGS: The heart size and mediastinal contours are within normal limits. Both lungs are clear. Mild linear atelectasis at the left base. The visualized skeletal structures are unremarkable. IMPRESSION: No active cardiopulmonary disease. Electronically Signed   By: Donavan Foil M.D.   On: 12/15/2019 22:29      IMPRESSION AND PLAN:   1.  Esophageal impaction probably secondary to Bezoar with subsequent dysphagia.  The patient is status post 2 previous esophageal dilatation twice.  He will be admitted to observation medical bed.  A GI consultation will be obtained in a.m. Dr. Marius Ditch was notified about the patient.  He may need another esophageal dilatation if his dysphagia is not resolving by the morning.  We will place the patient on IV Protonix.  2.  Asthma with COPD.  We will continue his as needed duo nebs as well as scheduled Breo Ellipta and Flovent.  3.  Anxiety and depression.  We will continue Lexapro and Klonopin.  4.  Allergic rhinitis.  We will continue Flonase and Claritin.  5.  DVT prophylaxis.  Subcutaneous Lovenox.   All the records are reviewed and case discussed with ED provider. The plan of care was discussed in details with the patient (and family). I answered all questions.  The patient agreed to proceed with the above mentioned plan. Further management will depend upon hospital course.   CODE STATUS: Full code  TOTAL TIME TAKING CARE OF THIS PATIENT: 50 minutes.    Christel Mormon M.D on 12/15/2019 at 10:32 PM  [facility]   From 7 PM-7 AM, contact night-coverage [email or web address]  CC: Primary care physician; [facility]   Note: This dictation was prepared with Dragon dictation along with smaller phrase technology. Any transcriptional errors that result from this process are unintentional.

## 2019-12-15 NOTE — ED Notes (Signed)
ED TO INPATIENT HANDOFF REPORT  ED Nurse Name and Phone #: Gwynneth Munson, RN [phone]  S Name/Age/Gender Steven [name] 39 y.o. male Room/Bed: ED19A/ED19A  Code Status   Code Status: Full Code  Home/SNF/Other Home Patient oriented to: self, place, time and situation Is this baseline? Yes   Triage Complete: Triage complete  Chief Complaint Dysphagia [R13.10]  Triage Note Pt to ED reporting 1 hour ago he took an allgra pill that is stuck in throat pt unable to drink without vomiting and Is spitting into a bottle in triage. Pt has had a food bolus of chicken and went for an endosopy    Allergies Allergies  Allergen Reactions  . Diphenhydramine Anaphylaxis  . Guaifenesin Swelling    Bodily Swelling  . Doxycycline     tachycardia  . Pork-Derived Products   . Amoxicillin Nausea Only    Other reaction(s): Other (See Comments) Increases anxiety Other reaction(s): Other (See Comments) Increases anxiety    Level of Care/Admitting Diagnosis ED Disposition    ED Disposition Condition Comment   Admit  Hospital Area: [facility] [[id number]]  Level of Care: Med-Surg [16]  Covid Evaluation: Asymptomatic Screening Protocol (No Symptoms)  Diagnosis: Dysphagia [175102]  Admitting Physician: Hannah Beat [[id number]]  Attending Physician: Hannah Beat [[id number]]       B Medical/Surgery History Past Medical History:  Diagnosis Date  . Anxiety   . Arthritis    knee  . Asthma   . Chronic mental illness   . COPD (chronic obstructive pulmonary disease) (HCC)   . Depression   . GERD (gastroesophageal reflux disease)   . Heart palpitations   . Panic attack   . Pulmonary fibrosis (HCC)    Past Surgical History:  Procedure Laterality Date  . ESOPHAGOGASTRODUODENOSCOPY N/A 02/18/2019   Procedure: ESOPHAGOGASTRODUODENOSCOPY (EGD);  Surgeon: Toledo, Boykin Nearing, MD;  Location: [facility] ENDOSCOPY;  Service: Gastroenterology;  Laterality: N/A;  . ESOPHAGOGASTRODUODENOSCOPY  (EGD) WITH PROPOFOL N/A 09/05/2017   Procedure: ESOPHAGOGASTRODUODENOSCOPY (EGD) WITH PROPOFOL;  Surgeon: Toledo, Boykin Nearing, MD;  Location: [facility] ENDOSCOPY;  Service: Gastroenterology;  Laterality: N/A;  . none    . TUBES IN EARS       A IV Location/Drains/Wounds Patient Lines/Drains/Airways Status   Active Line/Drains/Airways    Name:   Placement date:   Placement time:   Site:   Days:   Peripheral IV 12/15/19 Left Antecubital   12/15/19    2125    Antecubital   less than 1          Intake/Output Last 24 hours No intake or output data in the 24 hours ending 12/15/19 2238  Labs/Imaging Results for orders placed or performed during the hospital encounter of 12/15/19 (from the past 48 hour(s))  Basic metabolic panel     Status: Abnormal   Collection Time: 12/15/19  9:27 PM  Result Value Ref Range   Sodium 140 135 - 145 mmol/L   Potassium 3.9 3.5 - 5.1 mmol/L   Chloride 106 98 - 111 mmol/L   CO2 27 22 - 32 mmol/L   Glucose, Bld 157 (H) 70 - 99 mg/dL   BUN <5 (L) 6 - 20 mg/dL   Creatinine, Ser 1.44 0.61 - 1.24 mg/dL   Calcium 9.8 8.9 - 31.5 mg/dL   GFR calc non Af Amer >60 >60 mL/min   GFR calc Af Amer >60 >60 mL/min   Anion gap 7 5 - 15    Comment: Performed at [facility],  [address], [place], [place] [address]  CBC with Differential     Status: Abnormal   Collection Time: 12/15/19  9:27 PM  Result Value Ref Range   WBC 18.7 (H) 4.0 - 10.5 K/uL   RBC 5.36 4.22 - 5.81 MIL/uL   Hemoglobin 15.6 13.0 - 17.0 g/dL   HCT 78.4 69.6 - 29.5 %   MCV 85.8 80.0 - 100.0 fL   MCH 29.1 26.0 - 34.0 pg   MCHC 33.9 30.0 - 36.0 g/dL   RDW 28.4 13.2 - 44.0 %   Platelets 407 (H) 150 - 400 K/uL   nRBC 0.0 0.0 - 0.2 %   Neutrophils Relative % 86 %   Neutro Abs 16.0 (H) 1.7 - 7.7 K/uL   Lymphocytes Relative 9 %   Lymphs Abs 1.7 0.7 - 4.0 K/uL   Monocytes Relative 4 %   Monocytes Absolute 0.7 0.1 - 1.0 K/uL   Eosinophils Relative 1 %   Eosinophils Absolute 0.2 0.0 - 0.5 K/uL    Basophils Relative 0 %   Basophils Absolute 0.1 0.0 - 0.1 K/uL   Immature Granulocytes 0 %   Abs Immature Granulocytes 0.07 0.00 - 0.07 K/uL    Comment: Performed at [facility], [address] [place], [place] [address]   DG Chest 2 View  Result Date: 12/15/2019 CLINICAL DATA:  Chest discomfort dysphagia EXAM: CHEST - 2 VIEW COMPARISON:  None. FINDINGS: The heart size and mediastinal contours are within normal limits. Both lungs are clear. Mild linear atelectasis at the left base. The visualized skeletal structures are unremarkable. IMPRESSION: No active cardiopulmonary disease. Electronically Signed   By: Jasmine Pang M.D.   On: 12/15/2019 22:29    Pending Labs Unresulted Labs (From admission, onward)    Start     Ordered   12/16/19 0500  Basic metabolic panel  Tomorrow morning,   STAT     12/15/19 2232   12/16/19 0500  CBC  Tomorrow morning,   STAT     12/15/19 2232   12/15/19 2232  HIV Antibody (routine testing w rflx)  (HIV Antibody (Routine testing w reflex) panel)  Once,   STAT     12/15/19 2232   12/15/19 2211  Respiratory Panel by RT PCR (Flu A&B, Covid) - Nasopharyngeal Swab  (Tier 2 Respiratory Panel by RT PCR (Flu A&B, Covid) (TAT 2 hrs))  Once,   STAT    Question Answer Comment  Is this test for diagnosis or screening Screening   Symptomatic for COVID-19 as defined by CDC No   Hospitalized for COVID-19 No   Admitted to ICU for COVID-19 No   Previously tested for COVID-19 No   Resident in a congregate (group) care setting No   Employed in healthcare setting No      12/15/19 2211          Vitals/Pain Today's Vitals   12/15/19 1929 12/15/19 1933 12/15/19 2130 12/15/19 2230  BP:  130/73 130/73 (!) 141/62  Pulse:  (!) 110 (!) 113 92  Resp:  17 17 17   Temp:      TempSrc:      SpO2:  99% 98% 98%  Weight:      Height:      PainSc: 6        Isolation Precautions No active isolations  Medications Medications  escitalopram (LEXAPRO) tablet 20  mg (has no administration in time range)  clonazePAM (KLONOPIN) tablet 0.25-0.5 mg (has no administration in time  range)  divalproex (DEPAKOTE) DR tablet 125 mg (has no administration in time range)  albuterol (PROVENTIL) (2.5 MG/3ML) 0.083% nebulizer solution 2.5 mg (has no administration in time range)  ipratropium (ATROVENT HFA) inhaler 2 puff (has no administration in time range)  fluticasone furoate-vilanterol (BREO ELLIPTA) 100-25 MCG/INH 1 puff (has no administration in time range)  loratadine (CLARITIN) tablet 10 mg (has no administration in time range)  fluticasone (FLOVENT HFA) 220 MCG/ACT inhaler 2 puff (has no administration in time range)  fluticasone (FLONASE) 50 MCG/ACT nasal spray 1 spray (has no administration in time range)  ipratropium-albuterol (DUONEB) 0.5-2.5 (3) MG/3ML nebulizer solution 3 mL (has no administration in time range)  enoxaparin (LOVENOX) injection 40 mg (has no administration in time range)  0.9 %  sodium chloride infusion (has no administration in time range)  acetaminophen (TYLENOL) tablet 650 mg (has no administration in time range)    Or  acetaminophen (TYLENOL) suppository 650 mg (has no administration in time range)  traZODone (DESYREL) tablet 25 mg (has no administration in time range)  magnesium hydroxide (MILK OF MAGNESIA) suspension 30 mL (has no administration in time range)  ondansetron (ZOFRAN) tablet 4 mg (has no administration in time range)    Or  ondansetron (ZOFRAN) injection 4 mg (has no administration in time range)  glucagon (human recombinant) (GLUCAGEN) injection 1 mg (1 mg Intramuscular Given 12/15/19 1945)  pantoprazole (PROTONIX) injection 40 mg (40 mg Intravenous Given 12/15/19 2132)  sodium chloride 0.9 % bolus 1,000 mL (1,000 mLs Intravenous New Bag/Given 12/15/19 2159)    Mobility walks Low fall risk   Focused Assessments    R Recommendations: See Admitting Provider Note  Report given to:   Additional Notes:

## 2019-12-15 NOTE — ED Notes (Signed)
Accepting RN unable to take report at this time and asked to be called back in 10 minutes.

## 2019-12-15 NOTE — ED Provider Notes (Signed)
[facility] Emergency Department Provider Note ____________________________________________   First MD Initiated Contact with Patient 12/15/19 1922     (approximate)  I have reviewed the triage vital signs and the nursing notes.   HISTORY  Chief Complaint Food Bolus    HPI Steven [name] is a 39 y.o. male with PMH as noted below who presents with inability to swallow after taking an Allegra-D pill about an hour ago.  He states it was a large pill.  He feels a sensation of something stuck in his chest, and when he tries to drink liquids it goes down for a few seconds and then comes right back up.  He has to spit out his own saliva periodically.  He denies any shortness of breath.  Past Medical History:  Diagnosis Date  . Anxiety   . Arthritis    knee  . Asthma   . Chronic mental illness   . COPD (chronic obstructive pulmonary disease) ([place])   . Depression   . GERD (gastroesophageal reflux disease)   . Heart palpitations   . Panic attack   . Pulmonary fibrosis [facility])     Patient Active Problem List   Diagnosis Date Noted  . High risk medication use 07/18/2019  . Flexor tendon laceration, wrist, open wound, left, subsequent encounter 05/20/2019  . GAD (generalized anxiety disorder) 05/08/2019  . Panic disorder 05/08/2019  . MDD (major depressive disorder), recurrent episode, mild ([place]) 05/08/2019  . Intermittent explosive disorder in adult 05/08/2019  . Gastroesophageal reflux disease without esophagitis 02/18/2019  . PVC's (premature ventricular contractions) 01/31/2019  . Allergy to meat 10/11/2018  . Chronic left upper quadrant pain 10/11/2018  . 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 Heart palpitations 05/31/2018  . Anxiety, generalized 05/31/2018  . Asthma, chronic 04/05/2015  . Chronic obstructive airway disease with asthma ([name]) 03/17/2015  . Obesity 03/17/2015  . DOE (dyspnea on exertion) 03/17/2015  . Environmental allergies  04/03/2014  . Chest pain 08/02/2013  . Thyroid nodule 03/21/2013    Past Surgical History:  Procedure Laterality Date  . ESOPHAGOGASTRODUODENOSCOPY N/A 02/18/2019   Procedure: ESOPHAGOGASTRODUODENOSCOPY (EGD);  Surgeon: Toledo, Benay Pike, MD;  Location: [facility] ENDOSCOPY;  Service: Gastroenterology;  Laterality: N/A;  . ESOPHAGOGASTRODUODENOSCOPY (EGD) WITH PROPOFOL N/A 09/05/2017   Procedure: ESOPHAGOGASTRODUODENOSCOPY (EGD) WITH PROPOFOL;  Surgeon: Toledo, Benay Pike, MD;  Location: [facility] ENDOSCOPY;  Service: Gastroenterology;  Laterality: N/A;  . none    . TUBES IN EARS      Prior to Admission medications   Medication Sig Start Date End Date Taking? Authorizing Provider  acetaminophen (TYLENOL) 500 MG tablet Take 1,000 mg by mouth every 6 (six) hours as needed for moderate pain.    [provider]  albuterol (PROVENTIL) (2.5 MG/3ML) 0.083% nebulizer solution Inhale 3 mLs (2.5 mg total) into the lungs every 6 (six) hours as needed for wheezing or shortness of breath. 07/10/18   Kendell Bane, NP  ATROVENT HFA 17 MCG/ACT inhaler Inhale 2 puffs into the lungs every 6 (six) hours as needed for wheezing. 07/10/18   Scarboro, Audie Clear, NP  BREO ELLIPTA 100-25 MCG/INH AEPB Inhale 1 puff into the lungs daily. 07/10/18   Kendell Bane, NP  clonazePAM (KLONOPIN) 0.5 MG tablet Take 0.5-1 tablets (0.25-0.5 mg total) by mouth as directed. Take half to  one tablet up to 3 times a week as needed for anxiety attacks 08/27/19 09/26/19  Ursula Alert, MD  COMBIVENT RESPIMAT 20-100 MCG/ACT AERS respimat INHALE 2 PUFFS  INTO LUNGS EVERY 4 HOURS. 10/04/19   Yevonne Pax, MD  divalproex (DEPAKOTE) 125 MG DR tablet Take 1 tablet (125 mg total) by mouth 3 (three) times daily. 05/08/19   Jomarie Longs, MD  EPIPEN 2-PAK 0.3 MG/0.3ML SOAJ injection 0.3 mg.  12/03/15   [provider]  escitalopram (LEXAPRO) 20 MG tablet TAKE 1 TABLET BY MOUTH ONCE DAILY. 07/16/19   Jomarie Longs, MD  fexofenadine  (ALLEGRA) 180 MG tablet Take 180 mg by mouth daily as needed for allergies.     [provider]  FLOVENT HFA 220 MCG/ACT inhaler  03/21/19   [provider]  fluticasone (FLONASE) 50 MCG/ACT nasal spray Place 1 spray into both nostrils daily. 06/23/15   [provider]  fluticasone (FLOVENT HFA) 220 MCG/ACT inhaler Swallow, two sprays in the morning and two sprays at nightime. 03/21/19   [provider]  omeprazole (PRILOSEC) 20 MG capsule Take 20 mg by mouth 2 (two) times daily before a meal.     [provider]  OXYGEN Inhale into the lungs. 2LITERS AT NIGHT AND WHEN SICK    [provider]  PROAIR HFA 108 (90 Base) MCG/ACT inhaler INAHLE 2 PUFFS BY MOUTH EVERY 4 TO 6 HOURS AS NEEDED 09/15/16   Mungal, Vishal, MD  propranolol (INDERAL) 10 MG tablet Take 1 tablet (10 mg total) by mouth 2 (two) times daily as needed. FOR SEVERE ANXIETY ATTACKS 03/04/19   Jomarie Longs, MD    Allergies Diphenhydramine, Guaifenesin, Doxycycline, Pork-derived products, and Amoxicillin  Family History  Problem Relation Age of Onset  . COPD Mother   . Asthma Mother   . Mental illness Mother   . COPD Father   . Pulmonary fibrosis Father   . Lung cancer Father     Social History Social History   Tobacco Use  . Smoking status: Former Smoker    Packs/day: 0.50    Years: 2.00    Pack years: 1.00    Types: Cigarettes    Quit date: 10/31/2000    Years since quitting: 19.1  . Smokeless tobacco: Current User    Types: Chew  . Tobacco comment: 2003  Substance Use Topics  . Alcohol use: No    Alcohol/week: 0.0 standard drinks  . Drug use: No    Review of Systems  Constitutional: No fever. Eyes: No redness. ENT: No sore throat. Cardiovascular: Denies chest pain. Respiratory: Denies shortness of breath. Gastrointestinal: Positive for nausea. Genitourinary: Negative for flank pain.  Musculoskeletal: Negative for back pain. Skin: Negative for  rash. Neurological: Negative for headache.   ____________________________________________   PHYSICAL EXAM:  VITAL SIGNS: ED Triage Vitals  Enc Vitals Group     BP 12/15/19 1906 (!) 166/59     Pulse Rate 12/15/19 1906 (!) 117     Resp 12/15/19 1906 16     Temp 12/15/19 1906 98.5 F (36.9 C)     Temp Source 12/15/19 1906 Oral     SpO2 12/15/19 1906 100 %     Weight 12/15/19 1908 257 lb (116.6 kg)     Height 12/15/19 1908 5\' 7"  (1.702 m)     Head Circumference --      Peak Flow --      Pain Score 12/15/19 1907 6     Pain Loc --      Pain Edu? --      Excl. in GC? --     Constitutional: Alert and oriented.  Slightly uncomfortable appearing but  in no acute distress. Eyes: Conjunctivae are normal.  No scleral icterus. Head: Atraumatic. Nose: No congestion/rhinnorhea. Mouth/Throat: Mucous membranes are moist.  Oropharynx clear. Neck: Normal range of motion.  Cardiovascular:  Good peripheral circulation. Respiratory: Normal respiratory effort.  No retractions.  Gastrointestinal:  No distention.  Musculoskeletal: Extremities warm and well perfused.  Neurologic:  Normal speech and language. No gross focal neurologic deficits are appreciated.  Skin:  Skin is warm and dry. No rash noted. Psychiatric: Mood and affect are normal. Speech and behavior are normal.  ____________________________________________   LABS (all labs ordered are listed, but only abnormal results are displayed)  Labs Reviewed  BASIC METABOLIC PANEL - Abnormal; Notable for the following components:      Result Value   Glucose, Bld 157 (*)    BUN <5 (*)    All other components within normal limits  CBC WITH DIFFERENTIAL/PLATELET - Abnormal; Notable for the following components:   WBC 18.7 (*)    Platelets 407 (*)    Neutro Abs 16.0 (*)    All other components within normal limits  RESPIRATORY PANEL BY RT PCR (FLU A&B, COVID)    ____________________________________________  EKG   ____________________________________________  RADIOLOGY    ____________________________________________   PROCEDURES  Procedure(s) performed: No  Procedures  Critical Care performed: No ____________________________________________   INITIAL IMPRESSION / ASSESSMENT AND PLAN / ED COURSE  Pertinent labs & imaging results that were available during my care of the patient were reviewed by me and considered in my medical decision making (see chart for details).  39 year old male with PMH as noted above presents with inability swallow after taking a large pill about an hour ago.  The patient feels a sensation of a foreign body in his lower esophagus, and when he drinks liquids they come back up after a few seconds.  He reports a history of an esophageal food impaction with chicken about a year ago that required endoscopy.  On exam he is well-appearing and his vital signs are normal except for tachycardia, although the patient states he is anxious.  The oropharynx is clear.  The physical exam is otherwise unremarkable.  Presentation is consistent with an esophageal impaction.  I will give IM glucagon.  I suspect that given that this is a pill it should dissolve within the next few hours.  If he has no relief with glucagon I will discuss with GI.  ----------------------------------------- 10:18 PM on 12/15/2019 -----------------------------------------  The patient had no relief after glucagon.  He continues to have to spit up liquids.  We also tried cola but this did not help.  I consulted Dr. Allegra Lai from GI who recommended giving Protonix and who recommended admitting the patient to the hospital service with a plan for endoscopy in the morning if he continued to be symptomatic.  On reassessment, the patient is still spitting up but otherwise appears comfortable.  His WBC count is 18, with the patient has no fever or other acute  symptoms.  I added on a chest x-ray.  I discussed the case with Dr. Arville Care from the hospitalist service for admission.  ___________________________  [name] was evaluated in Emergency Department on 12/15/2019 for the symptoms described in the history of present illness. He was evaluated in the context of the global COVID-19 pandemic, which necessitated consideration that the patient might be at risk for infection with the SARS-CoV-2 virus that causes COVID-19. Institutional protocols and algorithms that pertain to the evaluation of patients at risk for  COVID-19 are in a state of rapid change based on information released by regulatory bodies including the CDC and federal and state organizations. These policies and algorithms were followed during the patient's care in the ED. ____________________________________________   FINAL CLINICAL IMPRESSION(S) / ED DIAGNOSES  Final diagnoses:  Foreign body in esophagus, initial encounter      NEW MEDICATIONS STARTED DURING THIS VISIT:  New Prescriptions   No medications on file     Note:  This document was prepared using Dragon voice recognition software and may include unintentional dictation errors.    Dionne Bucy, MD 12/15/19 2220

## 2019-12-16 ENCOUNTER — Other Ambulatory Visit: Payer: Self-pay

## 2019-12-16 ENCOUNTER — Observation Stay: Payer: Medicare Other | Admitting: Anesthesiology

## 2019-12-16 ENCOUNTER — Encounter
Admission: EM | Disposition: A | Discharge status: Home / Self Care | Payer: Self-pay | Source: Home / Self Care | Attending: Emergency Medicine

## 2019-12-16 ENCOUNTER — Encounter: Payer: Self-pay | Admitting: Family Medicine

## 2019-12-16 DIAGNOSIS — R1310 Dysphagia, unspecified: Secondary | ICD-10-CM | POA: Diagnosis not present

## 2019-12-16 DIAGNOSIS — T18108A Unspecified foreign body in esophagus causing other injury, initial encounter: Secondary | ICD-10-CM

## 2019-12-16 DIAGNOSIS — T18108D Unspecified foreign body in esophagus causing other injury, subsequent encounter: Secondary | ICD-10-CM

## 2019-12-16 DIAGNOSIS — T18128A Food in esophagus causing other injury, initial encounter: Secondary | ICD-10-CM | POA: Diagnosis not present

## 2019-12-16 DIAGNOSIS — K200 Eosinophilic esophagitis: Secondary | ICD-10-CM | POA: Diagnosis not present

## 2019-12-16 HISTORY — PX: FOREIGN BODY REMOVAL: SHX962

## 2019-12-16 LAB — BASIC METABOLIC PANEL
Anion gap: 8 (ref 5–15)
BUN: <5 mg/dL — ABNORMAL LOW (ref 6–20)
CO2: 25 mmol/L (ref 22–32)
Calcium: 8.7 mg/dL — ABNORMAL LOW (ref 8.9–10.3)
Chloride: 106 mmol/L (ref 98–111)
Creatinine, Ser: 0.86 mg/dL (ref 0.61–1.24)
GFR calc Af Amer: >60 mL/min (ref >60)
GFR calc non Af Amer: >60 mL/min (ref >60)
Glucose, Bld: 101 mg/dL — ABNORMAL HIGH (ref 70–99)
Potassium: 3.5 mmol/L (ref 3.5–5.1)
Sodium: 139 mmol/L (ref 135–145)

## 2019-12-16 LAB — CBC
HCT: 42.2 % (ref 39.0–52.0)
Hemoglobin: 14.1 g/dL (ref 13.0–17.0)
MCH: 28.8 pg (ref 26.0–34.0)
MCHC: 33.4 g/dL (ref 30.0–36.0)
MCV: 86.1 fL (ref 80.0–100.0)
Platelets: 334 10*3/uL (ref 150–400)
RBC: 4.9 MIL/uL (ref 4.22–5.81)
RDW: 12.3 % (ref 11.5–15.5)
WBC: 9.7 10*3/uL (ref 4.0–10.5)
nRBC: 0 % (ref 0.0–0.2)

## 2019-12-16 SURGERY — REMOVAL, FOREIGN BODY
Anesthesia: General

## 2019-12-16 MED ORDER — FENTANYL CITRATE (PF) 100 MCG/2ML IJ SOLN
INTRAMUSCULAR | Status: AC
  Filled 2019-12-16: qty 2

## 2019-12-16 MED ORDER — ONDANSETRON HCL 4 MG/2ML IJ SOLN
INTRAMUSCULAR | Status: AC
  Filled 2019-12-16: qty 2

## 2019-12-16 MED ORDER — CLONAZEPAM 0.5 MG PO TABS: 0.5000 mg | ORAL_TABLET | Freq: Three times a day (TID) | ORAL | Status: DC | PRN

## 2019-12-16 MED ORDER — FENTANYL CITRATE (PF) 100 MCG/2ML IJ SOLN
INTRAMUSCULAR | Status: DC | PRN
  Administered 2019-12-16 (×2): 50 ug via INTRAVENOUS

## 2019-12-16 MED ORDER — PROPOFOL 10 MG/ML IV BOLUS
INTRAVENOUS | Status: AC
  Filled 2019-12-16: qty 20

## 2019-12-16 MED ORDER — MIDAZOLAM HCL 2 MG/2ML IJ SOLN
INTRAMUSCULAR | Status: AC
  Filled 2019-12-16: qty 2

## 2019-12-16 MED ORDER — SUCCINYLCHOLINE CHLORIDE 20 MG/ML IJ SOLN
INTRAMUSCULAR | Status: DC | PRN
  Administered 2019-12-16: 120 mg via INTRAVENOUS

## 2019-12-16 MED ORDER — MIDAZOLAM HCL 2 MG/2ML IJ SOLN
INTRAMUSCULAR | Status: DC | PRN
  Administered 2019-12-16: 2 mg via INTRAVENOUS

## 2019-12-16 MED ORDER — PROMETHAZINE HCL 25 MG/ML IJ SOLN: 6.2500 mg | INTRAMUSCULAR | Status: DC | PRN

## 2019-12-16 MED ORDER — ENOXAPARIN SODIUM 40 MG/0.4ML ~~LOC~~ SOLN
40.0000 mg | Freq: Two times a day (BID) | SUBCUTANEOUS | Status: DC
  Administered 2019-12-16: 40 mg via SUBCUTANEOUS
  Filled 2019-12-16: qty 0.4

## 2019-12-16 MED ORDER — LIDOCAINE HCL (CARDIAC) PF 100 MG/5ML IV SOSY
PREFILLED_SYRINGE | INTRAVENOUS | Status: DC | PRN
  Administered 2019-12-16: 60 mg via INTRAVENOUS

## 2019-12-16 MED ORDER — PROPOFOL 10 MG/ML IV BOLUS
INTRAVENOUS | Status: DC | PRN
  Administered 2019-12-16: 180 mg via INTRAVENOUS

## 2019-12-16 MED ORDER — FENTANYL CITRATE (PF) 100 MCG/2ML IJ SOLN: 25.0000 ug | INTRAMUSCULAR | Status: DC | PRN

## 2019-12-16 MED ORDER — ENOXAPARIN SODIUM 40 MG/0.4ML ~~LOC~~ SOLN
40.0000 mg | Freq: Two times a day (BID) | SUBCUTANEOUS | Status: DC
  Administered 2019-12-16 – 2019-12-17 (×2): 40 mg via SUBCUTANEOUS
  Filled 2019-12-16 (×2): qty 0.4

## 2019-12-16 MED ORDER — PANTOPRAZOLE SODIUM 40 MG IV SOLR
40.0000 mg | Freq: Two times a day (BID) | INTRAVENOUS | Status: DC
  Administered 2019-12-16 – 2019-12-17 (×4): 40 mg via INTRAVENOUS
  Filled 2019-12-16 (×4): qty 40

## 2019-12-16 MED ORDER — ONDANSETRON HCL 4 MG/2ML IJ SOLN
INTRAMUSCULAR | Status: DC | PRN
  Administered 2019-12-16: 4 mg via INTRAVENOUS

## 2019-12-16 NOTE — Op Note (Signed)
[facility] Gastroenterology Patient Name: Steven [name] Procedure Date: 12/16/2019 7:36 AM MRN: [id number] Account #: [id number] Date of Birth: [birth year] Admit Type: Inpatient Age: 39 Room: [facility] ENDO ROOM 3 Gender: Male Note Status: Finalized Procedure:             Upper GI endoscopy Indications:           Esophageal dysphagia, Foreign body in the esophagus Providers:             Toney Reil MD, MD Referring MD:          No Local Md, MD (Referring MD) Medicines:             General Anesthesia Complications:         No immediate complications. Estimated blood loss:                         Minimal. Procedure:             Pre-Anesthesia Assessment:                        - Prior to the procedure, a History and Physical was                         performed, and patient medications and allergies were                         reviewed. The patient is competent. The risks and                         benefits of the procedure and the sedation options and                         risks were discussed with the patient. All questions                         were answered and informed consent was obtained.                         Patient identification and proposed procedure were                         verified by the physician, the nurse, the                         anesthesiologist, the anesthetist and the technician                         in the pre-procedure area in the procedure room in the                         endoscopy suite. Mental Status Examination: alert and                         oriented. Airway Examination: normal oropharyngeal                         airway and neck mobility. Respiratory Examination:  clear to auscultation. CV Examination: normal.                         Prophylactic Antibiotics: The patient does not require                         prophylactic antibiotics. Prior Anticoagulants: The                          patient has taken no previous anticoagulant or                         antiplatelet agents. ASA Grade Assessment: III - A                         patient with severe systemic disease. After reviewing                         the risks and benefits, the patient was deemed in                         satisfactory condition to undergo the procedure. The                         anesthesia plan was to use general anesthesia.                         Immediately prior to administration of medications,                         the patient was re-assessed for adequacy to receive                         sedatives. The heart rate, respiratory rate, oxygen                         saturations, blood pressure, adequacy of pulmonary                         ventilation, and response to care were monitored                         throughout the procedure. The physical status of the                         patient was re-assessed after the procedure.                        After obtaining informed consent, the endoscope was                         passed under direct vision. Throughout the procedure,                         the patient's blood pressure, pulse, and oxygen                         saturations were monitored continuously. The Endoscope  was introduced through the mouth, and advanced to the                         second part of duodenum. The upper GI endoscopy was                         accomplished without difficulty. The patient tolerated                         the procedure well. Findings:      Pill were found in the lower third of the esophagus. Raptor grasping       device was used to grab the pill, during the process of grasping the       pill, it got pushed through into the stomach. This has resulted in       superficial tear with minimal bleeding which has stopped by the end of       procedure. Estimated blood loss was minimal. Biopsies were taken from        proximal esophagus with a cold forceps for histology.      Scope traversed through lower esophagus with mild resistance      The entire examined stomach was normal.      The duodenal bulb and second portion of the duodenum were normal.      Mucosal changes including longitudinal furrows, mucosal friability,       stenosis, tight circumferential folds and vertical lines were found in       the entire esophagus. Esophageal findings were graded using the       Eosinophilic Esophagitis Endoscopic Reference Score (EoE-EREFS) as:       Edema Grade 1 Present (decreased clarity or absence of vascular       markings), Rings Grade 1 Mild (subtle circumferential ridges seen on       esophageal distension), Exudates Grade 0 None (no white lesions seen),       Furrows Grade 1 Present (vertical lines with or without visible depth)       and Stricture present. Impression:            - Pill were found in the esophagus. Removal was                         successful.                        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                        - Normal duodenal bulb and second portion of the                         duodenum. Recommendation:        - Await pathology results.                        - Return patient to hospital ward for possible                         discharge same  day.                        - Clear liquid diet today                        - Mechanical soft diet long term.                        - Use Prilosec (omeprazole) 40 mg PO BID 3 months                         atleast.                        - Swallow Fluticasone 2 puffs BID for 3 months atleast                        - Repeat upper endoscopy in 1 month to check healing                         and assess need for dilation.                        - Follow up with Dr Norma Fredrickson Procedure Code(s):     --- Professional ---                        [phone], Esophagogastroduodenoscopy, flexible,                          transoral; with removal of foreign body(s)                        43239, Esophagogastroduodenoscopy, flexible,                         transoral; with biopsy, single or multiple CPT copyright 2019 American Medical Association. All rights reserved. The codes documented in this report are preliminary and upon coder review may  be revised to meet current compliance requirements. Dr. Libby Maw Toney Reil MD, MD 12/16/2019 8:48:12 AM This report has been signed electronically. Number of Addenda: 0 Note Initiated On: 12/16/2019 7:36 AM Estimated Blood Loss:  Estimated blood loss was minimal.      [facility]

## 2019-12-16 NOTE — Progress Notes (Signed)
Anticoagulation monitoring(Lovenox):  39yo  male ordered Lovenox 40 mg Q24h for DVT prevention  Filed Weights   12/15/19 1908  Weight: 257 lb (116.6 kg)   BMI 40.25   Lab Results  Component Value Date   CREATININE 0.86 12/15/2019   CREATININE 0.91 04/29/2019   CREATININE 0.90 06/09/2018   Estimated Creatinine Clearance: 144.9 mL/min (by C-G formula based on SCr of 0.86 mg/dL). Hemoglobin & Hematocrit     Component Value Date/Time   HGB 15.6 12/15/2019 2127   HCT 46.0 12/15/2019 2127     Per Protocol for Patient with estCrcl > 30 ml/min and BMI > 40, will transition to Lovenox 40 mg Q12h.     Clovia Cuff, PharmD, BCPS 12/16/2019 12:25 AM

## 2019-12-16 NOTE — Anesthesia Procedure Notes (Signed)
Procedure Name: Intubation Date/Time: 12/16/2019 8:23 AM Performed by: Jonna Clark, CRNA Pre-anesthesia Checklist: Patient identified, Patient being monitored, Timeout performed, Emergency Drugs available and Suction available Patient Re-evaluated:Patient Re-evaluated prior to induction Oxygen Delivery Method: Circle system utilized Preoxygenation: Pre-oxygenation with 100% oxygen Induction Type: IV induction Ventilation: Mask ventilation without difficulty Laryngoscope Size: Mac and 4 Grade View: Grade I Tube type: Oral Tube size: 7.0 mm Number of attempts: 1 Airway Equipment and Method: Stylet Placement Confirmation: ETT inserted through vocal cords under direct vision,  positive ETCO2 and breath sounds checked- equal and bilateral Secured at: 21 cm Tube secured with: Tape Dental Injury: Teeth and Oropharynx as per pre-operative assessment

## 2019-12-16 NOTE — Plan of Care (Signed)

## 2019-12-16 NOTE — Progress Notes (Signed)
D: Pt alert and oriented x 4. Pt reports experiencing a sore throat at this time and difficulty swallowing. Pt refused all meds with the exception to tylenol r/t fearing it would get stuck in his throat. Pt meds are given crushed in chocolate pudding/magic cup. Pt was given tylenol crushed and a magic cup (ice cream) for throat pain.  A: Scheduled medications administered to pt, per MD orders. Support and encouragement provided. Frequent verbal contact made.    R: No adverse drug reactions noted. Pt complaint with medications and treatment plan. Pt interacts well with staff on the unit. Pt is stable at this time, will continue to monitor and provide care for as ordered.

## 2019-12-16 NOTE — Transfer of Care (Signed)
Immediate Anesthesia Transfer of Care Note  Patient: Steven [name]  Procedure(s) Performed: FOREIGN BODY REMOVAL (N/A )  Patient Location: PACU  Anesthesia Type:General  Level of Consciousness: awake, alert  and oriented  Airway & Oxygen Therapy: Patient Spontanous Breathing  Post-op Assessment: Report given to RN and Post -op Vital signs reviewed and stable  Post vital signs: Reviewed and stable  Last Vitals:  Vitals Value Taken Time  BP 102/74 12/16/19 0851  Temp 36.4 C 12/16/19 0850  Pulse 116 12/16/19 0855  Resp 13 12/16/19 0855  SpO2 92 % 12/16/19 0855  Vitals shown include unvalidated device data.  Last Pain:  Vitals:   12/16/19 0850  TempSrc:   PainSc: 0-No pain         Complications: No apparent anesthesia complications

## 2019-12-16 NOTE — Anesthesia Preprocedure Evaluation (Signed)
Anesthesia Evaluation  Patient identified by MRN, date of birth, ID band Patient awake    Reviewed: Allergy & Precautions, NPO status , Patient's Chart, lab work & pertinent test results  History of Anesthesia Complications Negative for: history of anesthetic complications  Airway Mallampati: II  TM Distance: >3 FB Neck ROM: Full    Dental  (+) Missing, Poor Dentition   Pulmonary neg shortness of breath, asthma , neg sleep apnea, COPD,  COPD inhaler, neg recent URI, Patient abstained from smoking., former smoker,  Pulmonary fibrosis   breath sounds clear to auscultation- rhonchi (-) wheezing      Cardiovascular (-) hypertension(-) angina(-) CAD, (-) Past MI, (-) Cardiac Stents and (-) CABG  Rhythm:Regular Rate:Normal - Systolic murmurs and - Diastolic murmurs    Neuro/Psych neg Seizures PSYCHIATRIC DISORDERS Anxiety Depression negative neurological ROS     GI/Hepatic Neg liver ROS, hiatal hernia, GERD  ,Esophageal food impaction   Endo/Other  neg diabetesMorbid obesity  Renal/GU negative Renal ROS     Musculoskeletal  (+) Arthritis ,   Abdominal (+) + obese,   Peds  Hematology negative hematology ROS (+)   Anesthesia Other Findings Past Medical History: No date: Anxiety No date: Arthritis     Comment:  knee No date: Asthma No date: Chronic mental illness No date: COPD (chronic obstructive pulmonary disease) (HCC) No date: Depression No date: GERD (gastroesophageal reflux disease) No date: Heart palpitations No date: Panic attack No date: Pulmonary fibrosis (HCC)   Reproductive/Obstetrics                             Anesthesia Physical  Anesthesia Plan  ASA: III and emergent  Anesthesia Plan: General   Post-op Pain Management:    Induction: Intravenous, Rapid sequence and Cricoid pressure planned  PONV Risk Score and Plan: 1 and Ondansetron, Midazolam, Dexamethasone and  Treatment may vary due to age or medical condition  Airway Management Planned: Oral ETT  Additional Equipment:   Intra-op Plan:   Post-operative Plan: Extubation in OR  Informed Consent: I have reviewed the patients History and Physical, chart, labs and discussed the procedure including the risks, benefits and alternatives for the proposed anesthesia with the patient or authorized representative who has indicated his/her understanding and acceptance.     Dental advisory given  Plan Discussed with: CRNA and Anesthesiologist  Anesthesia Plan Comments:         Anesthesia Quick Evaluation

## 2019-12-16 NOTE — Progress Notes (Signed)
IV occluded. Notifying Charity fundraiser. Attempting to start IV left arm but was not successful.  IV consult team notified.

## 2019-12-16 NOTE — Consult Note (Signed)
Arlyss Repress, MD [address]  [address]  [place], [place] [address]  Main: [phone]  Fax: [phone] Pager: [phone]   Consultation  Referring Provider:     No ref. provider found Primary Care Physician:  [facility] Primary Gastroenterologist:  Dr. Norma Fredrickson      Reason for Consultation: Dysphagia, foreign body  Date of Admission:  12/15/2019 Date of Consultation:  12/16/2019         HPI:   Steven [name] is a 39 y.o. male with known history of reflux esophagitis presented to ER yesterday after he swallowed Allegra pill which got stuck.  Patient had food impaction last year, however it passed spontaneously and was found to have LA grade C reflux esophagitis.  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  He is also taking omeprazole 20 mg twice daily.  Patient was given glucagon, Coke in the ER.  This morning he thinks the pain may have resolved but feels irritated and less discomfort.  He initially had leukocytosis, repeat CBC was normal.  Chest x-ray did not reveal any infiltrates.  COVID-19 test negative  NSAIDs: None  Antiplts/Anticoagulants/Anti thrombotics: None  GI Procedures:  EGD 02/18/2019 - Food was found in the esophagus. Removal was successful. - LA Grade C reflux esophagitis. Biopsied. - A small amount of food (residue) in the stomach. - Gastritis. - 1 cm hiatal hernia. - Normal examined duodenum. - The examination was otherwise normal. DIAGNOSIS:  A. ESOPHAGUS, PROXIMAL AND DISTAL; COLD BIOPSY:  - BENIGN SQUAMOUS MUCOSA WITH ACANTHOSIS, SPONGIOSIS, AND BASAL CELL  HYPERPLASIA.  - FOCAL MILD INCREASE IN EOSINOPHILS (UP TO 12 PER HPF).  - NEGATIVE FOR DYSPLASIA AND MALIGNANCY.  EGD 09/05/2017 - Normal esophagus. Biopsied. - Hypertonic lower esophageal sphincter. Dilated. - Normal stomach. - Normal examined duodenum. DIAGNOSIS:  A. ESOPHAGUS; COLD BIOPSY:  -  SINGLE FRAGMENT OF REACTIVE SQUAMOUS MUCOSA WITH A MAXIMUM OF 10  EOSINOPHILS IN A HPF.  - DEEPER LEVELS WERE EXAMINED.  - NEGATIVE FOR DYSPLASIA AND MALIGNANCY.   Past Medical History:  Diagnosis Date  . Anxiety   . Arthritis    knee  . Asthma   . Chronic mental illness   . COPD (chronic obstructive pulmonary disease) (HCC)   . Depression   . Eosinophilic esophagitis   . GERD (gastroesophageal reflux disease)   . Heart palpitations   . Hiatal hernia   . Panic attack   . Pulmonary fibrosis (HCC)     Past Surgical History:  Procedure Laterality Date  . ESOPHAGOGASTRODUODENOSCOPY N/A 02/18/2019   Procedure: ESOPHAGOGASTRODUODENOSCOPY (EGD);  Surgeon: Toledo, Boykin Nearing, MD;  Location: [facility] ENDOSCOPY;  Service: Gastroenterology;  Laterality: N/A;  . ESOPHAGOGASTRODUODENOSCOPY (EGD) WITH PROPOFOL N/A 09/05/2017   Procedure: ESOPHAGOGASTRODUODENOSCOPY (EGD) WITH PROPOFOL;  Surgeon: Toledo, Boykin Nearing, MD;  Location: [facility] ENDOSCOPY;  Service: Gastroenterology;  Laterality: N/A;  . FOREIGN BODY REMOVAL    . none    . TUBES IN EARS      Prior to Admission medications   Medication Sig Start Date End Date Taking? Authorizing Provider  acetaminophen (TYLENOL) 500 MG tablet Take 1,000 mg by mouth every 6 (six) hours as needed for moderate pain.   Yes [provider]  albuterol (PROVENTIL) (2.5 MG/3ML) 0.083% nebulizer solution Inhale 3 mLs (2.5 mg total) into the lungs every 6 (six) hours as needed for wheezing or shortness of breath. 07/10/18  Yes Johnna Acosta, NP  ATROVENT HFA 17 MCG/ACT inhaler Inhale 2 puffs into the lungs every 6 (six) hours as needed for wheezing. 07/10/18  Yes Scarboro, Coralee North, NP  BREO ELLIPTA 100-25 MCG/INH AEPB Inhale 1 puff into the lungs daily. 07/10/18  Yes Scarboro, Coralee North, NP  clonazePAM (KLONOPIN) 1 MG tablet Take 1 mg by mouth 3 (three) times daily as needed. 10/31/19  Yes [provider]  COMBIVENT RESPIMAT 20-100 MCG/ACT AERS respimat  INHALE 2 PUFFS INTO LUNGS EVERY 4 HOURS. 10/04/19  Yes Freda Munro A, MD  escitalopram (LEXAPRO) 20 MG tablet TAKE 1 TABLET BY MOUTH ONCE DAILY. 07/16/19  Yes Jomarie Longs, MD  fexofenadine (ALLEGRA) 180 MG tablet Take 180 mg by mouth daily as needed for allergies.    Yes [provider]  fluticasone (FLONASE) 50 MCG/ACT nasal spray Place 1 spray into both nostrils daily. 06/23/15  Yes [provider]  fluticasone (FLOVENT HFA) 220 MCG/ACT inhaler Swallow, two sprays in the morning and two sprays at nightime. 03/21/19  Yes [provider]  omeprazole (PRILOSEC) 20 MG capsule Take 20 mg by mouth 2 (two) times daily before a meal.    Yes [provider]  EPIPEN 2-PAK 0.3 MG/0.3ML SOAJ injection 0.3 mg.  12/03/15   [provider]  OXYGEN Inhale into the lungs. 2LITERS AT NIGHT AND WHEN SICK    [provider]  PROAIR HFA 108 (90 Base) MCG/ACT inhaler INAHLE 2 PUFFS BY MOUTH EVERY 4 TO 6 HOURS AS NEEDED Patient not taking: Reported on 12/15/2019 09/15/16   Stephanie Acre, MD  propranolol (INDERAL) 10 MG tablet Take 1 tablet (10 mg total) by mouth 2 (two) times daily as needed. FOR SEVERE ANXIETY ATTACKS Patient not taking: Reported on 12/15/2019 03/04/19   Jomarie Longs, MD    Current Facility-Administered Medications:  .  0.9 %  sodium chloride infusion, , Intravenous, Continuous, Mansy, Jan A, MD, Last Rate: 100 mL/hr at 12/16/19 0132, New Bag at 12/16/19 0132 .  [MAR Hold] acetaminophen (TYLENOL) tablet 650 mg, 650 mg, Oral, Q6H PRN **OR** [MAR Hold] acetaminophen (TYLENOL) suppository 650 mg, 650 mg, Rectal, Q6H PRN, Mansy, Vernetta Honey, MD .  Kei.Heading Hold] albuterol (PROVENTIL) (2.5 MG/3ML) 0.083% nebulizer solution 2.5 mg, 2.5 mg, Inhalation, Q6H PRN, Mansy, Vernetta Honey, MD .  Mitzi Hansen Hold] clonazePAM (KLONOPIN) tablet 0.25-0.5 mg, 0.25-0.5 mg, Oral, UD, Mansy, Jan A, MD .  Mitzi Hansen Hold] divalproex (DEPAKOTE) DR tablet 125 mg, 125 mg, Oral, TID, Mansy, Jan A, MD .   Mitzi Hansen Hold] escitalopram (LEXAPRO) tablet 20 mg, 20 mg, Oral, Daily, Mansy, Jan A, MD .  Mitzi Hansen Hold] fluticasone (FLONASE) 50 MCG/ACT nasal spray 1 spray, 1 spray, Each Nare, Daily, Mansy, Jan A, MD .  Mitzi Hansen Hold] fluticasone (FLOVENT HFA) 220 MCG/ACT inhaler 2 puff, 2 puff, Inhalation, BID, Mansy, Jan A, MD, 2 puff at 12/16/19 0111 .  [MAR Hold] fluticasone furoate-vilanterol (BREO ELLIPTA) 100-25 MCG/INH 1 puff, 1 puff, Inhalation, Daily, Mansy, Jan A, MD .  Mitzi Hansen Hold] ipratropium (ATROVENT) nebulizer solution 0.5 mg, 2.5 mL, Inhalation, Q6H PRN, Mansy, Vernetta Honey, MD .  Mitzi Hansen Hold] ipratropium-albuterol (DUONEB) 0.5-2.5 (3) MG/3ML nebulizer solution 3 mL, 3 mL, Nebulization, QID, Mansy, Jan A, MD .  Mitzi Hansen Hold] loratadine (CLARITIN) tablet 10 mg, 10 mg, Oral, Daily, Mansy, Jan A, MD .  Mitzi Hansen Hold] magnesium hydroxide (MILK OF MAGNESIA) suspension 30 mL, 30 mL, Oral, Daily PRN, Mansy, Vernetta Honey, MD .  Kei.Heading Hold] ondansetron (  ZOFRAN) tablet 4 mg, 4 mg, Oral, Q6H PRN **OR** [MAR Hold] ondansetron (ZOFRAN) injection 4 mg, 4 mg, Intravenous, Q6H PRN, Mansy, Jan A, MD .  Mitzi Hansen Hold] pantoprazole (PROTONIX) injection 40 mg, 40 mg, Intravenous, Q12H, Mansy, Jan A, MD, 40 mg at 12/16/19 0137 .  [MAR Hold] traZODone (DESYREL) tablet 25 mg, 25 mg, Oral, QHS PRN, Mansy, Vernetta Honey, MD   Family History  Problem Relation Age of Onset  . COPD Mother   . Asthma Mother   . Mental illness Mother   . COPD Father   . Pulmonary fibrosis Father   . Lung cancer Father      Social History   Tobacco Use  . Smoking status: Former Smoker    Packs/day: 0.50    Years: 2.00    Pack years: 1.00    Types: Cigarettes    Quit date: 10/31/2000    Years since quitting: 19.1  . Smokeless tobacco: Current User    Types: Chew  . Tobacco comment: 2003  Substance Use Topics  . Alcohol use: No    Alcohol/week: 0.0 standard drinks  . Drug use: No    Allergies as of 12/15/2019 - Review Complete 12/15/2019  Allergen Reaction Noted  .  Diphenhydramine Anaphylaxis 03/17/2015  . Guaifenesin Swelling 03/17/2015  . Doxycycline  04/16/2018  . Pork-derived products  02/18/2019  . Amoxicillin Nausea Only 03/17/2015    Review of Systems:    All systems reviewed and negative except where noted in HPI.   Physical Exam:  Vital signs in last 24 hours: Temp:  [96.3 F (35.7 C)-98.5 F (36.9 C)] 96.3 F (35.7 C) (02/15 0741) Pulse Rate:  [91-117] 100 (02/15 0741) Resp:  [16-20] 20 (02/15 0741) BP: (125-166)/(57-97) 137/97 (02/15 0741) SpO2:  [98 %-100 %] 100 % (02/15 0741) Weight:  [116.2 kg-116.6 kg] 116.6 kg (02/15 0741) Last BM Date: 12/15/19 General:   Pleasant, cooperative in NAD Head:  Normocephalic and atraumatic. Eyes:   No icterus.   Conjunctiva pink. PERRLA. Ears:  Normal auditory acuity. Neck:  Supple; no masses or thyroidomegaly Lungs: Respirations even and unlabored. Lungs clear to auscultation bilaterally.   No wheezes, crackles, or rhonchi.  Heart:  Regular rate and rhythm;  Without murmur, clicks, rubs or gallops Abdomen:  Soft, nondistended, nontender. Normal bowel sounds. No appreciable masses or hepatomegaly.  No rebound or guarding.  Rectal:  Not performed. Msk:  Symmetrical without gross deformities.  Strength normal Extremities:  Without edema, cyanosis or clubbing. Neurologic:  Alert and oriented x3;  grossly normal neurologically. Skin:  Intact without significant lesions or rashes. Psych:  Alert and cooperative. Normal affect.  LAB RESULTS: CBC Latest Ref Rng & Units 12/16/2019 12/15/2019 04/29/2019  WBC 4.0 - 10.5 K/uL 9.7 18.7(H) 11.1(H)  Hemoglobin 13.0 - 17.0 g/dL 28.3 66.2 94.7  Hematocrit 39.0 - 52.0 % 42.2 46.0 39.8  Platelets 150 - 400 K/uL 334 407(H) 347    BMET BMP Latest Ref Rng & Units 12/16/2019 12/15/2019 04/29/2019  Glucose 70 - 99 mg/dL 654(Y) 503(T) 465(K)  BUN 6 - 20 mg/dL <8(L) <2(X) 8  Creatinine 0.61 - 1.24 mg/dL 5.17 0.01 7.49  Sodium 135 - 145 mmol/L 139 140 137   Potassium 3.5 - 5.1 mmol/L 3.5 3.9 4.1  Chloride 98 - 111 mmol/L 106 106 105  CO2 22 - 32 mmol/L 25 27 25   Calcium 8.9 - 10.3 mg/dL ) 9.8 4.4(H)    LFT No flowsheet data found.   STUDIES: DG Chest  2 View  Result Date: 12/15/2019 CLINICAL DATA:  Chest discomfort dysphagia EXAM: CHEST - 2 VIEW COMPARISON:  None. FINDINGS: The heart size and mediastinal contours are within normal limits. Both lungs are clear. Mild linear atelectasis at the left base. The visualized skeletal structures are unremarkable. IMPRESSION: No active cardiopulmonary disease. Electronically Signed   By: Donavan Foil M.D.   On: 12/15/2019 22:29      Impression / Plan:   Steven [name] is a 39 y.o. male with history of ?  Eosinophilic esophagitis, LA grade C esophagitis, dilation in the past presented with impaction of the pill in the esophagus last night  Proceed with EGD today with proximal and distal esophageal biopsies separately Increase omeprazole to 40 mg 2 times a day before meals Follow-up with Dr. Alice Reichert as outpatient  Thank you for involving me in the care of this patient.      LOS: 0 days   Sherri Sear, MD  12/16/2019, 8:17 AM   Note: This dictation was prepared with Dragon dictation along with smaller phrase technology. Any transcriptional errors that result from this process are unintentional.

## 2019-12-16 NOTE — TOC Progression Note (Signed)
Transition of Care [facility]) - Progression Note    Patient Details  Name: Steven [name] MRN: [id number] Date of Birth: [birth year]  Transition of [facility]) CM/SW Contact  Barrie Dunker, RN Phone Number: 12/16/2019, 3:58 PM  Clinical Narrative:     Patient has had an EGD done today and dilated, he is 38 and independent at home. He has no CM needs       Expected Discharge Plan and Services                                                 Social Determinants of Health (SDOH) Interventions    Readmission Risk Interventions No flowsheet data found.

## 2019-12-16 NOTE — Plan of Care (Signed)
  Problem: Activity: Goal: Risk for activity intolerance will decrease Outcome: Completed/Met

## 2019-12-16 NOTE — Anesthesia Postprocedure Evaluation (Signed)
Anesthesia Post Note  Patient: Steven [name]  Procedure(s) Performed: FOREIGN BODY REMOVAL (N/A )  Patient location during evaluation: PACU Anesthesia Type: General Level of consciousness: awake and alert Pain management: pain level controlled Vital Signs Assessment: post-procedure vital signs reviewed and stable Respiratory status: spontaneous breathing, nonlabored ventilation, respiratory function stable and patient connected to nasal cannula oxygen Cardiovascular status: blood pressure returned to baseline and stable Postop Assessment: no apparent nausea or vomiting Anesthetic complications: no     Last Vitals:  Vitals:   12/16/19 0938 12/16/19 1226  BP: 131/75   Pulse: 91   Resp:    Temp: 36.7 C   SpO2: 93% 93%    Last Pain:  Vitals:   12/16/19 1047  TempSrc:   PainSc: 4                  Lenard Simmer

## 2019-12-16 NOTE — Progress Notes (Signed)
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 S/p 2 previous esophageal dilatation twice. Continue on IV protonix. Use PPI x 3 months at least, fluticasone 2 puffs BID 3 months & repeat EGD in 1 month to check for healing  Clear liquid diet today, mechanical soft diet long term as per GI. GI following & recs apprec  Asthma with COPD. W/o exacerbation. Continue on breo ellipta, flovent.  Anxiety: severity unknown. Continue on klonopin  Depression: severity unknown. Continue on lexapro  Allergic rhinitis: will continue flonase, claritin.  Obesity: BMI 40.2. Would benefit from weight loss  DVT prophylaxis: SCDs Code Status: full  Family Communication:  Disposition Plan:    Consultants:  GI    Procedures:    Antimicrobials:   Subjective: Pt c/o throat soreness.   Objective: Vitals:   12/15/19 1933 12/15/19 2130 12/15/19 2230 12/15/19 2335  BP: 130/73 130/73 (!) 141/62 (!) 125/57  Pulse: (!) 110 (!) 113 92 91  Resp: 17 17 17 18   Temp:    98 F (36.7 C)  TempSrc:    Oral  SpO2: 99% 98% 98% 100%  Weight:    116.2 kg  Height:        Intake/Output Summary (Last 24 hours) at 12/16/2019 0742 Last data filed at 12/16/2019 0220 Gross per 24 hour  Intake 1000 ml  Output --  Net 1000 ml   Filed Weights   12/15/19 1908 12/15/19 2335  Weight: 116.6 kg 116.2 kg    Examination:  General exam: Appears calm and comfortable  Respiratory system: decreased breath sounds b/l. No wheezes, rales, or rhochi Cardiovascular system: S1 & S2 +. No rubs, gallops or clicks.  Gastrointestinal system: Abdomen is obese, soft and nontender.  Normal bowel sounds heard. Central nervous system: Alert and  oriented. Moves all 4 extremities  Psychiatry: Judgement and insight appear normal. Flat mood and affect    Data Reviewed: I have personally reviewed following labs and imaging studies  CBC: Recent Labs  Lab 12/15/19 2127 12/16/19 0332  WBC 18.7* 9.7  NEUTROABS 16.0*  --   HGB 15.6 14.1  HCT 46.0 42.2  MCV 85.8 86.1  PLT 407* 334   Basic Metabolic Panel: Recent Labs  Lab 12/15/19 2127 12/16/19 0332  NA 140 139  K 3.9 3.5  CL 106 106  CO2 27 25  GLUCOSE 157* 101*  BUN <5* <5*  CREATININE 0.86 0.86  CALCIUM 9.8 8.7*   GFR: Estimated Creatinine Clearance: 144.6 mL/min (by C-G formula based on SCr of 0.86 mg/dL). Liver Function Tests: No results for input(s): AST, ALT, ALKPHOS, BILITOT, PROT, ALBUMIN in the last 168 hours. No results for input(s): LIPASE, AMYLASE in the last 168 hours. No results for input(s): AMMONIA in the last 168 hours. Coagulation Profile: No results for input(s): INR, PROTIME in the last 168 hours. Cardiac Enzymes: No results for input(s): CKTOTAL, CKMB, CKMBINDEX, TROPONINI in the last 168 hours. BNP (last 3 results) No results for input(s): PROBNP in the last 8760 hours. HbA1C: No results for input(s): HGBA1C in the last 72 hours. CBG: No results for input(s): GLUCAP in the last 168 hours. Lipid Profile: No results for input(s): CHOL, HDL, LDLCALC, TRIG, CHOLHDL, LDLDIRECT  in the last 72 hours. Thyroid Function Tests: No results for input(s): TSH, T4TOTAL, FREET4, T3FREE, THYROIDAB in the last 72 hours. Anemia Panel: No results for input(s): VITAMINB12, FOLATE, FERRITIN, TIBC, IRON, RETICCTPCT in the last 72 hours. Sepsis Labs: No results for input(s): PROCALCITON, LATICACIDVEN in the last 168 hours.  Recent Results (from the past 240 hour(s))  Respiratory Panel by RT PCR (Flu A&B, Covid) - Nasopharyngeal Swab     Status: None   Collection Time: 12/15/19 10:28 PM   Specimen: Nasopharyngeal Swab  Result Value Ref Range Status   SARS  Coronavirus 2 by RT PCR NEGATIVE NEGATIVE Final    Comment: (NOTE) SARS-CoV-2 target nucleic acids are NOT DETECTED. The SARS-CoV-2 RNA is generally detectable in upper respiratoy specimens during the acute phase of infection. The lowest concentration of SARS-CoV-2 viral copies this assay can detect is 131 copies/mL. A negative result does not preclude SARS-Cov-2 infection and should not be used as the sole basis for treatment or other patient management decisions. A negative result may occur with  improper specimen collection/handling, submission of specimen other than nasopharyngeal swab, presence of viral mutation(s) within the areas targeted by this assay, and inadequate number of viral copies (<131 copies/mL). A negative result must be combined with clinical observations, patient history, and epidemiological information. The expected result is Negative. Fact Sheet for Patients:  https://www.moore.com/ Fact Sheet for Healthcare Providers:  https://www.young.biz/ This test is not yet ap proved or cleared by the [place] FDA and  has been authorized for detection and/or diagnosis of SARS-CoV-2 by FDA under an Emergency Use Authorization (EUA). This EUA will remain  in effect (meaning this test can be used) for the duration of the COVID-19 declaration under Section 564(b)(1) of the Act, 21 U.S.C. section 360bbb-3(b)(1), unless the authorization is terminated or revoked sooner.    Influenza A by PCR NEGATIVE NEGATIVE Final   Influenza B by PCR NEGATIVE NEGATIVE Final    Comment: (NOTE) The Xpert Xpress SARS-CoV-2/FLU/RSV assay is intended as an aid in  the diagnosis of influenza from Nasopharyngeal swab specimens and  should not be used as a sole basis for treatment. Nasal washings and  aspirates are unacceptable for Xpert Xpress SARS-CoV-2/FLU/RSV  testing. Fact Sheet for Patients: https://www.moore.com/ Fact Sheet  for Healthcare Providers: https://www.young.biz/ This test is not yet approved or cleared by the [place] FDA and  has been authorized for detection and/or diagnosis of SARS-CoV-2 by  FDA under an Emergency Use Authorization (EUA). This EUA will remain  in effect (meaning this test can be used) for the duration of the  Covid-19 declaration under Section 564(b)(1) of the Act, 21  U.S.C. section 360bbb-3(b)(1), unless the authorization is  terminated or revoked. Performed at [facility], [address] [place], [place] [address]          Radiology Studies: DG Chest 2 View  Result Date: 12/15/2019 CLINICAL DATA:  Chest discomfort dysphagia EXAM: CHEST - 2 VIEW COMPARISON:  None. FINDINGS: The heart size and mediastinal contours are within normal limits. Both lungs are clear. Mild linear atelectasis at the left base. The visualized skeletal structures are unremarkable. IMPRESSION: No active cardiopulmonary disease. Electronically Signed   By: Jasmine Pang M.D.   On: 12/15/2019 22:29        Scheduled Meds: . [MAR Hold] clonazePAM  0.25-0.5 mg Oral UD  . [MAR Hold] divalproex  125 mg Oral TID  . [MAR Hold] escitalopram  20 mg Oral Daily  . [facility]  Hold] fluticasone  1 spray Each Nare Daily  . [MAR Hold] fluticasone  2 puff Inhalation BID  . [MAR Hold] fluticasone furoate-vilanterol  1 puff Inhalation Daily  . [MAR Hold] ipratropium-albuterol  3 mL Nebulization QID  . [MAR Hold] loratadine  10 mg Oral Daily  . [MAR Hold] pantoprazole (PROTONIX) IV  40 mg Intravenous Q12H   Continuous Infusions: . sodium chloride 100 mL/hr at 12/16/19 0132     LOS: 0 days    Time spent: 32 mins     Wyvonnia Dusky, MD [facility] Pager [phone]  If 7PM-7AM, please contact night-coverage [email or web address] 12/16/2019, 7:42 AM

## 2019-12-17 ENCOUNTER — Encounter: Payer: Self-pay | Admitting: *Deleted

## 2019-12-17 DIAGNOSIS — T18108A Unspecified foreign body in esophagus causing other injury, initial encounter: Secondary | ICD-10-CM | POA: Diagnosis not present

## 2019-12-17 DIAGNOSIS — R1310 Dysphagia, unspecified: Secondary | ICD-10-CM | POA: Diagnosis not present

## 2019-12-17 DIAGNOSIS — K200 Eosinophilic esophagitis: Secondary | ICD-10-CM | POA: Diagnosis not present

## 2019-12-17 DIAGNOSIS — T18128A Food in esophagus causing other injury, initial encounter: Secondary | ICD-10-CM | POA: Diagnosis not present

## 2019-12-17 LAB — CBC
HCT: 40.7 % (ref 39.0–52.0)
Hemoglobin: 13.3 g/dL (ref 13.0–17.0)
MCH: 29 pg (ref 26.0–34.0)
MCHC: 32.7 g/dL (ref 30.0–36.0)
MCV: 88.7 fL (ref 80.0–100.0)
Platelets: 290 10*3/uL (ref 150–400)
RBC: 4.59 MIL/uL (ref 4.22–5.81)
RDW: 12.4 % (ref 11.5–15.5)
WBC: 5.7 10*3/uL (ref 4.0–10.5)
nRBC: 0 % (ref 0.0–0.2)

## 2019-12-17 LAB — BASIC METABOLIC PANEL
Anion gap: 7 (ref 5–15)
BUN: <5 mg/dL — ABNORMAL LOW (ref 6–20)
CO2: 27 mmol/L (ref 22–32)
Calcium: 8.6 mg/dL — ABNORMAL LOW (ref 8.9–10.3)
Chloride: 107 mmol/L (ref 98–111)
Creatinine, Ser: 0.93 mg/dL (ref 0.61–1.24)
GFR calc Af Amer: >60 mL/min (ref >60)
GFR calc non Af Amer: >60 mL/min (ref >60)
Glucose, Bld: 91 mg/dL (ref 70–99)
Potassium: 3.5 mmol/L (ref 3.5–5.1)
Sodium: 141 mmol/L (ref 135–145)

## 2019-12-17 LAB — HIV ANTIBODY (ROUTINE TESTING W REFLEX): HIV Screen 4th Generation wRfx: NONREACTIVE

## 2019-12-17 LAB — SURGICAL PATHOLOGY

## 2019-12-17 MED ORDER — OMEPRAZOLE 40 MG PO CPDR: 40.0000 mg | DELAYED_RELEASE_CAPSULE | Freq: Two times a day (BID) | ORAL | 0 refills | Status: AC

## 2019-12-17 MED ORDER — FLOVENT HFA 220 MCG/ACT IN AERO: 2.0000 | INHALATION_SPRAY | Freq: Two times a day (BID) | RESPIRATORY_TRACT | 0 refills | Status: DC

## 2019-12-17 NOTE — Progress Notes (Signed)
D: Pt alert and oriented. Pt reports experiencing abdominal pain r/t procedure at this time, given prn tylenol.   A: Pt received discharge and medication education/information. Pt belongings were gathered and taken with pt upon discharge.   R: Pt verbalized understanding of discharge and medication education/information.  Pt  escorted to medical mall front lobby via wheelchair by staff where wife picked pt up.

## 2019-12-17 NOTE — Discharge Summary (Signed)
Physician Discharge Summary  Steven [name]:[id number] DOB: [birth year] DOA: 12/15/2019  PCP: [facility] date: 12/15/2019 Discharge date: 12/17/2019  Admitted From: home Disposition:  home  Recommendations for Outpatient Follow-up:  1. Follow up with PCP in 1-2 weeks 2. F/u Dr. Alice Reichert in 4 weeks  Home Health: no Equipment/Devices: n/a  Discharge Condition: stable CODE STATUS: full  Diet recommendation: soft diet    Brief/Interim Summary: HPI was taken from Dr. Sidney Ace: Steven [name]  is a 39 y.o. Caucasian male with a known history of asthma, COPD, and anxiety, depression and panic attacks, who presented to the emergency room with acute onset of dysphagia after taking Allegra-D pill an hour before he presented to the ER.  He stated that he has been having sinus congestion over the last 4 to 5 months and has taken Allegra-D before including the last 4 days.  He stated that he had dysphagia like this with inability to swallow a piece of chicken in the past.  He underwent esophageal dilatation twice.  He denied any fever or chills or nausea or vomiting.  He admitted to mild epigastric heartburn.  No melena or bright red bleeding per rectum.  No chest pain or palpitations.  Upon presentation to the emergency room, heart rate was 110 with otherwise normal vital signs.  Labs revealed significant cytosis of 18.7 with neutrophilia with a blood glucose of 157 and otherwise unremarkable CMP.  Two-view chest x-ray showed no acute cardiopulmonary disease.  The patient was given 1 mg of IM glucagon, 40 mg of IV Protonix and 1 L bolus of IV normal saline.  Dr. Marius Ditch was contacted by ER physician who recommended observation overnight.  He will be admitted to an observation medical bed for further evaluation and management.  Hospital Course from Dr. Lenise Herald 2/15/-12/17/19: Pt was found to have esophageal impaction secondary to a pil which was successful removed  via EGD. EGD showed lowed esophageal stenosis secondary eosinophilic esophagitis. Continue on PPI 40mg  BID x 3 months at least, fluticasone 2 puffs BID 3 months, & repeat EGD in 1 month to check for healing and soft diet long term as per GI.  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 Continue on protonix. Use PPI x 3 months at least, fluticasone 2 puffs BID 3 months & repeat EGD in 1 month to check for healing  Clear liquid diet today, mechanical soft diet long term as per GI. GI following & recs apprec  Asthma with COPD. W/o exacerbation. Continue on breo ellipta, flovent.  Anxiety: severity unknown. Continue on klonopin  Depression: severity unknown. Continue on lexapro  Allergic rhinitis: will continue flonase, claritin.  Obesity: BMI 40.2. Would benefit from weight loss    Discharge Instructions  Discharge Instructions    Diet general   Complete by: As directed    Soft diet- please read, follow hand out given to you by MD   Discharge instructions   Complete by: As directed    F/U PCP in 1-2 weeks; F/u GI (Dr. Alice Reichert in 4 weeks); F/u pulmon in 2 weeks   Increase activity slowly   Complete by: As directed      Allergies as of 12/17/2019      Reactions  Diphenhydramine Anaphylaxis   Guaifenesin Swelling   Bodily Swelling   Doxycycline    tachycardia   Pork-derived Products    Amoxicillin Nausea Only   Other reaction(s): Other (See Comments) Increases anxiety Other reaction(s): Other (See Comments) Increases anxiety      Medication List    TAKE these medications   acetaminophen 500 MG tablet Commonly known as: TYLENOL Take 1,000 mg by mouth every 6 (six) hours as needed for moderate pain.   albuterol (2.5  MG/3ML) 0.083% nebulizer solution Commonly known as: PROVENTIL Inhale 3 mLs (2.5 mg total) into the lungs every 6 (six) hours as needed for wheezing or shortness of breath. What changed: Another medication with the same name was removed. Continue taking this medication, and follow the directions you see here.   Atrovent HFA 17 MCG/ACT inhaler Generic drug: ipratropium Inhale 2 puffs into the lungs every 6 (six) hours as needed for wheezing.   Breo Ellipta 100-25 MCG/INH Aepb Generic drug: fluticasone furoate-vilanterol Inhale 1 puff into the lungs daily.   clonazePAM 1 MG tablet Commonly known as: KLONOPIN Take 1 mg by mouth 3 (three) times daily as needed.   Combivent Respimat 20-100 MCG/ACT Aers respimat Generic drug: Ipratropium-Albuterol INHALE 2 PUFFS INTO LUNGS EVERY 4 HOURS.   EpiPen 2-Pak 0.3 mg/0.3 mL Soaj injection Generic drug: EPINEPHrine 0.3 mg.   escitalopram 20 MG tablet Commonly known as: LEXAPRO TAKE 1 TABLET BY MOUTH ONCE DAILY.   fexofenadine 180 MG tablet Commonly known as: ALLEGRA Take 180 mg by mouth daily as needed for allergies.   Flovent HFA 220 MCG/ACT inhaler Generic drug: fluticasone Inhale 2 puffs into the lungs 2 (two) times daily. What changed:   how much to take  Another medication with the same name was removed. Continue taking this medication, and follow the directions you see here.   fluticasone 50 MCG/ACT nasal spray Commonly known as: FLONASE Place 1 spray into both nostrils daily.   omeprazole 40 MG capsule Commonly known as: PRILOSEC Take 1 capsule (40 mg total) by mouth in the morning and at bedtime. What changed:   medication strength  how much to take  when to take this   OXYGEN Inhale into the lungs. 2LITERS AT NIGHT AND WHEN SICK   propranolol 10 MG tablet Commonly known as: INDERAL Take 1 tablet (10 mg total) by mouth 2 (two) times daily as needed. FOR SEVERE ANXIETY ATTACKS       Allergies  Allergen  Reactions  . Diphenhydramine Anaphylaxis  . Guaifenesin Swelling    Bodily Swelling  . Doxycycline     tachycardia  . Pork-Derived Products   . Amoxicillin Nausea Only    Other reaction(s): Other (See Comments) Increases anxiety Other reaction(s): Other (See Comments) Increases anxiety    Consultations: GI, Dr. Allegra Lai  Procedures/Studies: DG Chest 2 View  Result Date: 12/15/2019 CLINICAL DATA:  Chest discomfort dysphagia EXAM: CHEST - 2 VIEW COMPARISON:  None. FINDINGS: The heart size and mediastinal contours are within normal limits. Both lungs are clear. Mild linear atelectasis at the left base. The visualized skeletal structures are unremarkable. IMPRESSION: No active cardiopulmonary disease. Electronically Signed   By: Jasmine Pang M.D.   On: 12/15/2019 22:29   EGD as stated above   Subjective: Pt c/o sore throat  Discharge Exam: Vitals:   12/17/19 0108 12/17/19 0815  BP: (!) 120/41 131/71  Pulse: 86 (!) 108  Resp:    Temp: (!) 97.4 F (36.3 C) 98 F (36.7 C)  SpO2: 99% 99%   Vitals:   12/16/19 1613 12/16/19 2007 12/17/19 0108 12/17/19 0815  BP: 102/61  (!) 120/41 131/71  Pulse: 89  86 (!) 108  Resp: 16     Temp: 97.6 F (36.4 C)  (!) 97.4 F (36.3 C) 98 F (36.7 C)  TempSrc:   Oral Oral  SpO2: 97% 96% 99% 99%  Weight:      Height:        General: Pt is alert, awake, not in acute distress Cardiovascular: S1/S2 +, no rubs, no gallops Respiratory: decreased breath sounds b/l. No rales Abdominal: Soft, NT,obese, bowel sounds + Extremities: b/l LE edema , no cyanosis    The results of significant diagnostics from this hospitalization (including imaging, microbiology, ancillary and laboratory) are listed below for reference.     Microbiology: Recent Results (from the past 240 hour(s))  Respiratory Panel by RT PCR (Flu A&B, Covid) - Nasopharyngeal Swab     Status: None   Collection Time: 12/15/19 10:28 PM   Specimen: Nasopharyngeal Swab  Result  Value Ref Range Status   SARS Coronavirus 2 by RT PCR NEGATIVE NEGATIVE Final    Comment: (NOTE) SARS-CoV-2 target nucleic acids are NOT DETECTED. The SARS-CoV-2 RNA is generally detectable in upper respiratoy specimens during the acute phase of infection. The lowest concentration of SARS-CoV-2 viral copies this assay can detect is 131 copies/mL. A negative result does not preclude SARS-Cov-2 infection and should not be used as the sole basis for treatment or other patient management decisions. A negative result may occur with  improper specimen collection/handling, submission of specimen other than nasopharyngeal swab, presence of viral mutation(s) within the areas targeted by this assay, and inadequate number of viral copies (<131 copies/mL). A negative result must be combined with clinical observations, patient history, and epidemiological information. The expected result is Negative. Fact Sheet for Patients:  https://www.moore.com/ Fact Sheet for Healthcare Providers:  https://www.young.biz/ This test is not yet ap proved or cleared by the [place] FDA and  has been authorized for detection and/or diagnosis of SARS-CoV-2 by FDA under an Emergency Use Authorization (EUA). This EUA will remain  in effect (meaning this test can be used) for the duration of the COVID-19 declaration under Section 564(b)(1) of the Act, 21 U.S.C. section 360bbb-3(b)(1), unless the authorization is terminated or revoked sooner.    Influenza A by PCR NEGATIVE NEGATIVE Final   Influenza B by PCR NEGATIVE NEGATIVE Final    Comment: (NOTE) The Xpert Xpress SARS-CoV-2/FLU/RSV assay is intended as an aid in  the diagnosis of influenza from Nasopharyngeal swab specimens and  should not be used as a sole basis for treatment. Nasal washings and  aspirates are unacceptable for Xpert Xpress SARS-CoV-2/FLU/RSV  testing. Fact Sheet for  Patients: https://www.moore.com/ Fact Sheet for Healthcare Providers: https://www.young.biz/ This test is not yet approved or cleared by the [place] FDA and  has been authorized for detection and/or diagnosis of SARS-CoV-2 by  FDA under an Emergency Use Authorization (EUA). This EUA will remain  in effect (meaning this test can be used) for the duration of the  Covid-19 declaration under Section 564(b)(1) of the Act, 21  U.S.C. section 360bbb-3(b)(1), unless the authorization is  terminated or revoked. Performed at [facility], [address] [place], [place] [address]      Labs: BNP (last 3 results) No results for input(s): BNP in the last 8760 hours. Basic Metabolic Panel: Recent Labs  Lab 12/15/19 2127 12/16/19 0332 12/17/19  0558  NA 140 139 141  K 3.9 3.5 3.5  CL 106 106 107  CO2 27 25 27   GLUCOSE 157* 101* 91  BUN <5* <5* <5*  CREATININE 0.86 0.86 0.93  CALCIUM 9.8 8.7* 8.6*   Liver Function Tests: No results for input(s): AST, ALT, ALKPHOS, BILITOT, PROT, ALBUMIN in the last 168 hours. No results for input(s): LIPASE, AMYLASE in the last 168 hours. No results for input(s): AMMONIA in the last 168 hours. CBC: Recent Labs  Lab 12/15/19 2127 12/16/19 0332 12/17/19 0558  WBC 18.7* 9.7 5.7  NEUTROABS 16.0*  --   --   HGB 15.6 14.1 13.3  HCT 46.0 42.2 40.7  MCV 85.8 86.1 88.7  PLT 407* 334 290   Cardiac Enzymes: No results for input(s): CKTOTAL, CKMB, CKMBINDEX, TROPONINI in the last 168 hours. BNP: Invalid input(s): POCBNP CBG: No results for input(s): GLUCAP in the last 168 hours. D-Dimer No results for input(s): DDIMER in the last 72 hours. Hgb A1c No results for input(s): HGBA1C in the last 72 hours. Lipid Profile No results for input(s): CHOL, HDL, LDLCALC, TRIG, CHOLHDL, LDLDIRECT in the last 72 hours. Thyroid function studies No results for input(s): TSH, T4TOTAL, T3FREE, THYROIDAB in the  last 72 hours.  Invalid input(s): FREET3 Anemia work up No results for input(s): VITAMINB12, FOLATE, FERRITIN, TIBC, IRON, RETICCTPCT in the last 72 hours. Urinalysis No results found for: COLORURINE, APPEARANCEUR, LABSPEC, PHURINE, GLUCOSEU, HGBUR, BILIRUBINUR, KETONESUR, PROTEINUR, UROBILINOGEN, NITRITE, LEUKOCYTESUR Sepsis Labs Invalid input(s): PROCALCITONIN,  WBC,  LACTICIDVEN Microbiology Recent Results (from the past 240 hour(s))  Respiratory Panel by RT PCR (Flu A&B, Covid) - Nasopharyngeal Swab     Status: None   Collection Time: 12/15/19 10:28 PM   Specimen: Nasopharyngeal Swab  Result Value Ref Range Status   SARS Coronavirus 2 by RT PCR NEGATIVE NEGATIVE Final    Comment: (NOTE) SARS-CoV-2 target nucleic acids are NOT DETECTED. The SARS-CoV-2 RNA is generally detectable in upper respiratoy specimens during the acute phase of infection. The lowest concentration of SARS-CoV-2 viral copies this assay can detect is 131 copies/mL. A negative result does not preclude SARS-Cov-2 infection and should not be used as the sole basis for treatment or other patient management decisions. A negative result may occur with  improper specimen collection/handling, submission of specimen other than nasopharyngeal swab, presence of viral mutation(s) within the areas targeted by this assay, and inadequate number of viral copies (<131 copies/mL). A negative result must be combined with clinical observations, patient history, and epidemiological information. The expected result is Negative. Fact Sheet for Patients:  12/17/19 Fact Sheet for Healthcare Providers:  https://www.moore.com/ This test is not yet ap proved or cleared by the https://www.young.biz/ FDA and  has been authorized for detection and/or diagnosis of SARS-CoV-2 by FDA under an Emergency Use Authorization (EUA). This EUA will remain  in effect (meaning this test can be used) for the  duration of the COVID-19 declaration under Section 564(b)(1) of the Act, 21 U.S.C. section 360bbb-3(b)(1), unless the authorization is terminated or revoked sooner.    Influenza A by PCR NEGATIVE NEGATIVE Final   Influenza B by PCR NEGATIVE NEGATIVE Final    Comment: (NOTE) The Xpert Xpress SARS-CoV-2/FLU/RSV assay is intended as an aid in  the diagnosis of influenza from Nasopharyngeal swab specimens and  should not be used as a sole basis for treatment. Nasal washings and  aspirates are unacceptable for Xpert Xpress SARS-CoV-2/FLU/RSV  testing. Fact Sheet for Patients: [place] Fact  Sheet for Healthcare Providers: https://www.young.biz/ This test is not yet approved or cleared by the [place] and  has been authorized for detection and/or diagnosis of SARS-CoV-2 by  FDA under an Emergency Use Authorization (EUA). This EUA will remain  in effect (meaning this test can be used) for the duration of the  Covid-19 declaration under Section 564(b)(1) of the Act, 21  U.S.C. section 360bbb-3(b)(1), unless the authorization is  terminated or revoked. Performed at [facility], [address] [place], [place] [address]      Time coordinating discharge: Over 30 minutes  SIGNED:   Charise Killian, MD  [facility] 12/17/2019, 1:16 PM Pager   If 7PM-7AM, please contact night-coverage [email or web address]

## 2019-12-19 ENCOUNTER — Ambulatory Visit: Payer: Medicare Other | Admitting: Internal Medicine

## 2019-12-20 ENCOUNTER — Telehealth: Payer: Self-pay

## 2019-12-20 NOTE — Telephone Encounter (Signed)
Tried to call patient but mailbox is full. Sent patient a mychart message to call the office Called ENDO and cancel the procedure

## 2019-12-20 NOTE — Telephone Encounter (Signed)
-----   Message from Toney Reil, MD sent at 12/19/2019  5:17 PM EST ----- Please inform patient that he should continue taking omeprazole 40 mg 2 times a day.  He can stop taking fluticasone spray.  It does not appear that he has eosinophilic esophagitis. Please cancel his upper endoscopy with me that is scheduled in March.  He is a patient of Dr. Norma Fredrickson and he should follow-up with Dr. Norma Fredrickson closely  Lannette Donath

## 2019-12-20 NOTE — Telephone Encounter (Signed)
Tried to call patient but voicemail Is full

## 2019-12-23 NOTE — Telephone Encounter (Signed)
Tried to call patient but voicemail is not set up  

## 2020-01-03 ENCOUNTER — Other Ambulatory Visit: Payer: Self-pay

## 2020-01-03 MED ORDER — COMBIVENT RESPIMAT 20-100 MCG/ACT IN AERS: INHALATION_SPRAY | RESPIRATORY_TRACT | 3 refills | Status: DC

## 2020-01-06 ENCOUNTER — Telehealth: Payer: Self-pay

## 2020-01-06 NOTE — Telephone Encounter (Signed)
Confirmed appointment on 01/08/2020 and screened for covid. klh 

## 2020-01-08 ENCOUNTER — Other Ambulatory Visit: Payer: Self-pay

## 2020-01-08 ENCOUNTER — Ambulatory Visit (INDEPENDENT_AMBULATORY_CARE_PROVIDER_SITE_OTHER): Payer: Medicare Other | Admitting: Internal Medicine

## 2020-01-08 DIAGNOSIS — R0602 Shortness of breath: Secondary | ICD-10-CM

## 2020-01-08 LAB — PULMONARY FUNCTION TEST

## 2020-01-10 ENCOUNTER — Ambulatory Visit: Admit: 2020-01-10 | Payer: Medicare Other | Admitting: Gastroenterology

## 2020-01-10 SURGERY — ESOPHAGOGASTRODUODENOSCOPY (EGD) WITH PROPOFOL
Anesthesia: General

## 2020-01-12 NOTE — Procedures (Signed)
[facility] [address] [place], [address]  DATE OF SERVICE: January 08, 2020  Complete Pulmonary Function Testing Interpretation:  FINDINGS:  Forced vital capacity is normal.  The FEV1 is 2.55 L which is 65% of predicted and is mildly decreased.  Postbronchodilator no change in FEV1 clinical improvement may still occur.  FEV1 FVC ratio is moderately decreased.  Total lung capacity is increased residual volume increased residual volume total lung capacity ratio is increased FRC is increased.  DLCO is normal.  IMPRESSION:  This pulmonary function study is consistent with mild obstructive lung disease.  No response to bronchodilators, correlation recommended  Yevonne Pax, MD [facility] Pulmonary Critical Care Medicine Sleep Medicine

## 2020-01-17 ENCOUNTER — Other Ambulatory Visit
Admission: RE | Admit: 2020-01-17 | Discharge: 2020-01-17 | Disposition: A | Discharge status: Home / Self Care | Payer: Medicare Other | Source: Ambulatory Visit | Attending: Internal Medicine | Admitting: Internal Medicine

## 2020-01-17 ENCOUNTER — Other Ambulatory Visit: Payer: Self-pay

## 2020-01-17 DIAGNOSIS — Z01812 Encounter for preprocedural laboratory examination: Secondary | ICD-10-CM | POA: Diagnosis present

## 2020-01-17 DIAGNOSIS — Z20822 Contact with and (suspected) exposure to covid-19: Secondary | ICD-10-CM | POA: Diagnosis not present

## 2020-01-18 LAB — SARS CORONAVIRUS 2 (TAT 6-24 HRS): SARS Coronavirus 2: NEGATIVE

## 2020-01-21 ENCOUNTER — Encounter: Payer: Self-pay | Admitting: Internal Medicine

## 2020-01-22 ENCOUNTER — Encounter: Payer: Self-pay | Admitting: Internal Medicine

## 2020-01-22 ENCOUNTER — Ambulatory Visit: Payer: Medicare Other | Admitting: Anesthesiology

## 2020-01-22 ENCOUNTER — Other Ambulatory Visit: Payer: Self-pay

## 2020-01-22 ENCOUNTER — Ambulatory Visit
Admission: RE | Admit: 2020-01-22 | Discharge: 2020-01-22 | Disposition: A | Discharge status: Home / Self Care | Payer: Medicare Other | Attending: Internal Medicine | Admitting: Internal Medicine

## 2020-01-22 ENCOUNTER — Encounter
Admission: RE | Disposition: A | Discharge status: Home / Self Care | Payer: Self-pay | Source: Home / Self Care | Attending: Internal Medicine

## 2020-01-22 DIAGNOSIS — Z881 Allergy status to other antibiotic agents status: Secondary | ICD-10-CM | POA: Diagnosis not present

## 2020-01-22 DIAGNOSIS — Z91018 Allergy to other foods: Secondary | ICD-10-CM | POA: Diagnosis not present

## 2020-01-22 DIAGNOSIS — R0609 Other forms of dyspnea: Secondary | ICD-10-CM | POA: Diagnosis not present

## 2020-01-22 DIAGNOSIS — Z888 Allergy status to other drugs, medicaments and biological substances status: Secondary | ICD-10-CM | POA: Diagnosis not present

## 2020-01-22 DIAGNOSIS — K222 Esophageal obstruction: Secondary | ICD-10-CM | POA: Insufficient documentation

## 2020-01-22 DIAGNOSIS — K219 Gastro-esophageal reflux disease without esophagitis: Secondary | ICD-10-CM | POA: Diagnosis not present

## 2020-01-22 DIAGNOSIS — R1314 Dysphagia, pharyngoesophageal phase: Secondary | ICD-10-CM | POA: Insufficient documentation

## 2020-01-22 DIAGNOSIS — F329 Major depressive disorder, single episode, unspecified: Secondary | ICD-10-CM | POA: Diagnosis not present

## 2020-01-22 DIAGNOSIS — M1990 Unspecified osteoarthritis, unspecified site: Secondary | ICD-10-CM | POA: Insufficient documentation

## 2020-01-22 DIAGNOSIS — Z79899 Other long term (current) drug therapy: Secondary | ICD-10-CM | POA: Diagnosis not present

## 2020-01-22 DIAGNOSIS — Z9981 Dependence on supplemental oxygen: Secondary | ICD-10-CM | POA: Insufficient documentation

## 2020-01-22 DIAGNOSIS — Z7951 Long term (current) use of inhaled steroids: Secondary | ICD-10-CM | POA: Insufficient documentation

## 2020-01-22 DIAGNOSIS — R002 Palpitations: Secondary | ICD-10-CM | POA: Insufficient documentation

## 2020-01-22 DIAGNOSIS — Z87891 Personal history of nicotine dependence: Secondary | ICD-10-CM | POA: Diagnosis not present

## 2020-01-22 DIAGNOSIS — K200 Eosinophilic esophagitis: Secondary | ICD-10-CM | POA: Insufficient documentation

## 2020-01-22 DIAGNOSIS — J8410 Pulmonary fibrosis, unspecified: Secondary | ICD-10-CM | POA: Insufficient documentation

## 2020-01-22 DIAGNOSIS — F419 Anxiety disorder, unspecified: Secondary | ICD-10-CM | POA: Insufficient documentation

## 2020-01-22 DIAGNOSIS — J449 Chronic obstructive pulmonary disease, unspecified: Secondary | ICD-10-CM | POA: Diagnosis not present

## 2020-01-22 DIAGNOSIS — Z880 Allergy status to penicillin: Secondary | ICD-10-CM | POA: Insufficient documentation

## 2020-01-22 DIAGNOSIS — K449 Diaphragmatic hernia without obstruction or gangrene: Secondary | ICD-10-CM | POA: Diagnosis not present

## 2020-01-22 DIAGNOSIS — K228 Other specified diseases of esophagus: Secondary | ICD-10-CM | POA: Diagnosis not present

## 2020-01-22 HISTORY — PX: ESOPHAGOGASTRODUODENOSCOPY (EGD) WITH PROPOFOL: SHX5813

## 2020-01-22 SURGERY — ESOPHAGOGASTRODUODENOSCOPY (EGD) WITH PROPOFOL
Anesthesia: General

## 2020-01-22 MED ORDER — PROPOFOL 10 MG/ML IV BOLUS
INTRAVENOUS | Status: DC | PRN
  Administered 2020-01-22: 50 mg via INTRAVENOUS
  Administered 2020-01-22: 100 mg via INTRAVENOUS

## 2020-01-22 MED ORDER — DEXMEDETOMIDINE HCL IN NACL 200 MCG/50ML IV SOLN
INTRAVENOUS | Status: AC
  Filled 2020-01-22: qty 50

## 2020-01-22 MED ORDER — PROPOFOL 500 MG/50ML IV EMUL
INTRAVENOUS | Status: DC | PRN
  Administered 2020-01-22: 125 ug/kg/min via INTRAVENOUS

## 2020-01-22 MED ORDER — SODIUM CHLORIDE 0.9 % IV SOLN
INTRAVENOUS | Status: DC
  Administered 2020-01-22: 1000 mL via INTRAVENOUS

## 2020-01-22 MED ORDER — LIDOCAINE HCL (CARDIAC) PF 100 MG/5ML IV SOSY
PREFILLED_SYRINGE | INTRAVENOUS | Status: DC | PRN
  Administered 2020-01-22: 40 mg via INTRATRACHEAL

## 2020-01-22 NOTE — Interval H&P Note (Signed)
History and Physical Interval Note:  01/22/2020 2:25 PM  Steven [name]  has presented today for surgery, with the diagnosis of DYSPHAGIA.  The various methods of treatment have been discussed with the patient and family. After consideration of risks, benefits and other options for treatment, the patient has consented to  Procedure(s): ESOPHAGOGASTRODUODENOSCOPY (EGD) WITH PROPOFOL (N/A) as a surgical intervention.  The patient's history has been reviewed, patient examined, no change in status, stable for surgery.  I have reviewed the patient's chart and labs.  Questions were answered to the patient's satisfaction.     [place], [place]

## 2020-01-22 NOTE — Transfer of Care (Signed)
Immediate Anesthesia Transfer of Care Note  Patient: Steven [name]  Procedure(s) Performed: ESOPHAGOGASTRODUODENOSCOPY (EGD) WITH PROPOFOL (N/A )  Patient Location: Endoscopy Unit  Anesthesia Type:General  Level of Consciousness: awake, alert  and oriented  Airway & Oxygen Therapy: Patient Spontanous Breathing  Post-op Assessment: Report given to RN and Post -op Vital signs reviewed and stable  Post vital signs: Reviewed and stable  Last Vitals:  Vitals Value Taken Time  BP 98/42 01/22/20 1440  Temp    Pulse 86 01/22/20 1441  Resp 23 01/22/20 1441  SpO2 93 % 01/22/20 1441  Vitals shown include unvalidated device data.  Last Pain:  Vitals:   01/22/20 1348  TempSrc: Temporal  PainSc: 0-No pain         Complications: No apparent anesthesia complications

## 2020-01-22 NOTE — Op Note (Signed)
[facility] Gastroenterology Patient Name: Steven [name] Procedure Date: 01/22/2020 2:25 PM MRN: [id number] Account #: [id number] Date of Birth: 01/20/1981 Admit Type: Outpatient Age: 39 Room: [facility] ENDO ROOM 3 Gender: Male Note Status: Finalized Procedure:             Upper GI endoscopy Indications:           Esophageal dysphagia, Eosinophilic esophagitis Providers:             Boykin Nearing. Norma Fredrickson MD, MD Referring MD:          No Local Md, MD (Referring MD) Medicines:             Propofol per Anesthesia Complications:         No immediate complications. Estimated blood loss: None. Procedure:             Pre-Anesthesia Assessment:                        - The risks and benefits of the procedure and the                         sedation options and risks were discussed with the                         patient. All questions were answered and informed                         consent was obtained.                        - Patient identification and proposed procedure were                         verified prior to the procedure by the nurse. The                         procedure was verified in the procedure room.                        - ASA Grade Assessment: III - A patient with severe                         systemic disease.                        - After reviewing the risks and benefits, the patient                         was deemed in satisfactory condition to undergo the                         procedure.                        After obtaining informed consent, the endoscope was                         passed under direct vision. Throughout the procedure,  the patient's blood pressure, pulse, and oxygen                         saturations were monitored continuously. The Endoscope                         was introduced through the mouth, and advanced to the                         lower third of esophagus. The upper GI endoscopy was                     accomplished without difficulty. The patient tolerated                         the procedure well. Findings:      Mucosal changes including feline appearance were found in the entire       esophagus.      One benign-appearing, intrinsic moderate stenosis was found at the       gastroesophageal junction. This stenosis measured 1.2 cm (inner       diameter) x less than one cm (in length). The stenosis was traversed.       The scope was withdrawn. Dilation was performed with a Maloney dilator       with mild resistance at 54 Fr.      The stomach was normal.      The examined duodenum was normal.      The exam was otherwise without abnormality. Impression:            - Esophageal mucosal changes.                        - Benign-appearing esophageal stenosis. Dilated.                        - Normal stomach.                        - Normal examined duodenum.                        - The examination was otherwise normal.                        - No specimens collected. Recommendation:        - Patient has a contact number available for                         emergencies. The signs and symptoms of potential                         delayed complications were discussed with the patient.                         Return to normal activities tomorrow. Written                         discharge instructions were provided to the patient.                        -  Resume previous diet.                        - Continue present medications.                        - Repeat upper endoscopy PRN for retreatment.                        - Return to physician assistant in 6 months.                        - Follow up with Octavia Bruckner, PA-C in [ ]  months.                        - The findings and recommendations were discussed with                         the patient. Procedure Code(s):     --- Professional ---                        93818, Esophagoscopy, flexible, transoral; diagnostic,                          including collection of specimen(s) by brushing or                         washing, when performed (separate procedure)                        43450, Dilation of esophagus, by unguided sound or                         bougie, single or multiple passes Diagnosis Code(s):     --- Professional ---                        E99.3, Eosinophilic esophagitis                        R13.14, Dysphagia, pharyngoesophageal phase                        K22.2, Esophageal obstruction                        K22.8, Other specified diseases of esophagus CPT copyright 2019 American Medical Association. All rights reserved. The codes documented in this report are preliminary and upon coder review may  be revised to meet current compliance requirements. Efrain Sella MD, MD 01/22/2020 2:38:46 PM This report has been signed electronically. Number of Addenda: 0 Note Initiated On: 01/22/2020 2:25 PM Estimated Blood Loss:  Estimated blood loss: none.      [facility]

## 2020-01-22 NOTE — Anesthesia Preprocedure Evaluation (Signed)
Anesthesia Evaluation  Patient identified by MRN, date of birth, ID band Patient awake    Reviewed: Allergy & Precautions, H&P , NPO status , Patient's Chart, lab work & pertinent test results, reviewed documented beta blocker date and time   Airway Mallampati: II   Neck ROM: full    Dental  (+) Poor Dentition   Pulmonary asthma , COPD, Patient abstained from smoking., former smoker,    Pulmonary exam normal        Cardiovascular Exercise Tolerance: Poor + DOE  Normal cardiovascular exam Rhythm:regular Rate:Normal     Neuro/Psych PSYCHIATRIC DISORDERS Anxiety Depression negative neurological ROS     GI/Hepatic Neg liver ROS, hiatal hernia, GERD  Medicated,  Endo/Other  negative endocrine ROS  Renal/GU negative Renal ROS  negative genitourinary   Musculoskeletal   Abdominal   Peds  Hematology negative hematology ROS (+)   Anesthesia Other Findings Past Medical History: No date: Anxiety No date: Arthritis     Comment:  knee No date: Asthma No date: Chronic mental illness No date: COPD (chronic obstructive pulmonary disease) (HCC) No date: Depression No date: Eosinophilic esophagitis No date: GERD (gastroesophageal reflux disease) No date: Heart palpitations No date: Hiatal hernia No date: Panic attack No date: Pulmonary fibrosis [facility]) Past Surgical History: 02/18/2019: ESOPHAGOGASTRODUODENOSCOPY; N/A     Comment:  Procedure: ESOPHAGOGASTRODUODENOSCOPY (EGD);  Surgeon:               [place], Boykin Nearing, MD;  Location: [facility] ENDOSCOPY;                Service: Gastroenterology;  Laterality: N/A; 09/05/2017: ESOPHAGOGASTRODUODENOSCOPY (EGD) WITH PROPOFOL; N/A     Comment:  Procedure: ESOPHAGOGASTRODUODENOSCOPY (EGD) WITH               PROPOFOL;  Surgeon: Toledo, Boykin Nearing, MD;  Location:               [facility] ENDOSCOPY;  Service: Gastroenterology;  Laterality:               N/A; No date: FOREIGN BODY  REMOVAL 12/16/2019: FOREIGN BODY REMOVAL; N/A     Comment:  Procedure: FOREIGN BODY REMOVAL;  Surgeon: Toney Reil, MD;  Location: [facility] ENDOSCOPY;  Service:               Gastroenterology;  Laterality: N/A; No date: none No date: TUBES IN EARS   Reproductive/Obstetrics negative OB ROS                             Anesthesia Physical Anesthesia Plan  ASA: III  Anesthesia Plan: General   Post-op Pain Management:    Induction:   PONV Risk Score and Plan:   Airway Management Planned:   Additional Equipment:   Intra-op Plan:   Post-operative Plan:   Informed Consent: I have reviewed the patients History and Physical, chart, labs and discussed the procedure including the risks, benefits and alternatives for the proposed anesthesia with the patient or authorized representative who has indicated his/her understanding and acceptance.     Dental Advisory Given  Plan Discussed with: CRNA  Anesthesia Plan Comments:         Anesthesia Quick Evaluation

## 2020-01-22 NOTE — H&P (Signed)
Outpatient short stay form Pre-procedure 01/22/2020 2:22 PM Steven [name], M.D.  Primary Physician: [facility]  Reason for visit:  Dysphagia, Eosinophilic esophagitis.  History of present illness: Mr. Steven [name] is a pleasant 39 year old male presenting today for elective upper endoscopy with possible esophageal dilatation.  Patient presented for urgent endoscopy on 12/16/2019 by Dr. Allegra Lai to treat esophageal pill foreign body.  This was achieved.  Patient was advised to take fluticasone Prilosec and he is now ready today for esophageal dilation.  He denies any acute episodes of obstruction since being placed on a very soft diet.    Current Facility-Administered Medications:  .  0.9 %  sodium chloride infusion, , Intravenous, Continuous, [place], Boykin Nearing, MD, Last Rate: 20 mL/hr at 01/22/20 1403, 1,000 mL at 01/22/20 1403  Medications Prior to Admission  Medication Sig Dispense Refill Last Dose  . acetaminophen (TYLENOL) 500 MG tablet Take 1,000 mg by mouth every 6 (six) hours as needed for moderate pain.   Past Week at Unknown time  . albuterol (PROVENTIL) (2.5 MG/3ML) 0.083% nebulizer solution Inhale 3 mLs (2.5 mg total) into the lungs every 6 (six) hours as needed for wheezing or shortness of breath. 75 mL 4 01/21/2020 at Unknown time  . ATROVENT HFA 17 MCG/ACT inhaler Inhale 2 puffs into the lungs every 6 (six) hours as needed for wheezing. 1 Inhaler 0 01/21/2020 at Unknown time  . BREO ELLIPTA 100-25 MCG/INH AEPB Inhale 1 puff into the lungs daily. 1 each 3 01/21/2020 at Unknown time  . clonazePAM (KLONOPIN) 1 MG tablet Take 1 mg by mouth 3 (three) times daily as needed.   01/22/2020 at Unknown time  . COMBIVENT RESPIMAT 20-100 MCG/ACT AERS respimat INHALE 2 PUFFS INTO LUNGS EVERY 4 HOURS. 8 g 3 01/22/2020 at Unknown time  . EPIPEN 2-PAK 0.3 MG/0.3ML SOAJ injection 0.3 mg.    Past Month at Unknown time  . escitalopram (LEXAPRO) 20 MG tablet TAKE 1 TABLET BY MOUTH ONCE DAILY. 90  tablet 0 01/21/2020 at Unknown time  . fluticasone (FLONASE) 50 MCG/ACT nasal spray Place 1 spray into both nostrils daily.   01/21/2020 at Unknown time  . loratadine (CLARITIN REDITABS) 10 MG dissolvable tablet Take 10 mg by mouth daily.   01/22/2020 at Unknown time  . OXYGEN Inhale into the lungs. 2LITERS AT NIGHT AND WHEN SICK   01/22/2020 at Unknown time  . fexofenadine (ALLEGRA) 180 MG tablet Take 180 mg by mouth daily as needed for allergies.    Not Taking at Unknown time  . fluticasone (FLOVENT HFA) 220 MCG/ACT inhaler Inhale 2 puffs into the lungs 2 (two) times daily. 1 Inhaler 0   . omeprazole (PRILOSEC) 40 MG capsule Take 1 capsule (40 mg total) by mouth in the morning and at bedtime. 60 capsule 0   . propranolol (INDERAL) 10 MG tablet Take 1 tablet (10 mg total) by mouth 2 (two) times daily as needed. FOR SEVERE ANXIETY ATTACKS (Patient not taking: Reported on 12/15/2019) 60 tablet 1 Not Taking at Unknown time     Allergies  Allergen Reactions  . Diphenhydramine Anaphylaxis  . Guaifenesin Swelling    Bodily Swelling  . Beef-Derived Products Other (See Comments)  . Doxycycline     tachycardia  . Pork-Derived Products   . Amoxicillin Nausea Only    Other reaction(s): Other (See Comments) Increases anxiety Other reaction(s): Other (See Comments) Increases anxiety     Past Medical History:  Diagnosis Date  . Anxiety   .  Arthritis    knee  . Asthma   . Chronic mental illness   . COPD (chronic obstructive pulmonary disease) ([place])   . Depression   . Eosinophilic esophagitis   . GERD (gastroesophageal reflux disease)   . Heart palpitations   . Hiatal hernia   . Panic attack   . Pulmonary fibrosis ([place])     Review of systems:  Otherwise negative.    Physical Exam  Gen: Alert, oriented. Appears stated age.  HEENT: [place]/AT. PERRLA. Lungs: CTA, no wheezes. CV: RR nl S1, S2. Abd: soft, benign, no masses. BS+ Ext: No edema. Pulses 2+    Planned procedures: Proceed  with EGD. The patient understands the nature of the planned procedure, indications, risks, alternatives and potential complications including but not limited to bleeding, infection, perforation, damage to internal organs and possible oversedation/side effects from anesthesia. The patient agrees and gives consent to proceed.  Please refer to procedure notes for findings, recommendations and patient disposition/instructions.     Sean Malinowski K. Alice Reichert, M.D. Gastroenterology 01/22/2020  2:22 PM

## 2020-01-23 ENCOUNTER — Encounter: Payer: Self-pay | Admitting: *Deleted

## 2020-01-23 NOTE — Anesthesia Postprocedure Evaluation (Signed)
Anesthesia Post Note  Patient: Steven [name]  Procedure(s) Performed: ESOPHAGOGASTRODUODENOSCOPY (EGD) WITH PROPOFOL (N/A )  Patient location during evaluation: PACU Anesthesia Type: General Level of consciousness: awake and alert Pain management: pain level controlled Vital Signs Assessment: post-procedure vital signs reviewed and stable Respiratory status: spontaneous breathing, nonlabored ventilation, respiratory function stable and patient connected to nasal cannula oxygen Cardiovascular status: blood pressure returned to baseline and stable Postop Assessment: no apparent nausea or vomiting Anesthetic complications: no     Last Vitals:  Vitals:   01/22/20 1509 01/22/20 1516  BP: 99/65 115/69  Pulse: 67 63  Resp: 20 19  Temp:    SpO2: 100% 100%    Last Pain:  Vitals:   01/23/20 0727  TempSrc:   PainSc: 0-No pain                 Yevette Edwards

## 2020-02-28 ENCOUNTER — Telehealth: Payer: Self-pay

## 2020-02-28 NOTE — Telephone Encounter (Signed)
Tried contacting patient to confirm appointment on 03/03/2020, no voicemail. klh

## 2020-03-03 ENCOUNTER — Ambulatory Visit: Payer: Medicare Other | Admitting: Internal Medicine

## 2020-04-04 ENCOUNTER — Other Ambulatory Visit: Payer: Self-pay | Admitting: Adult Health

## 2020-04-09 ENCOUNTER — Telehealth: Payer: Self-pay

## 2020-04-09 ENCOUNTER — Encounter: Payer: Self-pay | Admitting: Internal Medicine

## 2020-04-09 ENCOUNTER — Other Ambulatory Visit: Payer: Self-pay

## 2020-04-09 ENCOUNTER — Ambulatory Visit (INDEPENDENT_AMBULATORY_CARE_PROVIDER_SITE_OTHER): Payer: Medicare Other | Admitting: Internal Medicine

## 2020-04-09 VITALS — BP 147/77 | HR 96 | Temp 97.7°F | Resp 16 | Ht 67.0 in | Wt 241.2 lb

## 2020-04-09 DIAGNOSIS — Z9981 Dependence on supplemental oxygen: Secondary | ICD-10-CM

## 2020-04-09 DIAGNOSIS — Z01818 Encounter for other preprocedural examination: Secondary | ICD-10-CM

## 2020-04-09 DIAGNOSIS — J301 Allergic rhinitis due to pollen: Secondary | ICD-10-CM

## 2020-04-09 DIAGNOSIS — J449 Chronic obstructive pulmonary disease, unspecified: Secondary | ICD-10-CM

## 2020-04-09 DIAGNOSIS — R0602 Shortness of breath: Secondary | ICD-10-CM | POA: Diagnosis not present

## 2020-04-09 DIAGNOSIS — F411 Generalized anxiety disorder: Secondary | ICD-10-CM

## 2020-04-09 NOTE — Telephone Encounter (Signed)
Medical pre op clearance signed and faxed to [facility] @ [phone].

## 2020-04-09 NOTE — Progress Notes (Signed)
[facility] [address] [place], [place] [address]  Pulmonary Sleep Medicine   Office Visit Note  Patient Name: Steven [name] DOB: 07/10/1981 MRN [id number]  Date of Service: 04/09/2020  Complaints/HPI: PT is here for pulmonary clearance for 11 Teeth extraction.  He has a history of COPD. His last PFT showed an FEV1 of 2.55, which is 65% of pre-predicted value.  He Had a normal CXR in February 2021.  His spiro today is also good. He continues to use oxygen 3 LPM at night while sleeping.    ROS  General: (-) fever, (-) chills, (-) night sweats, (-) weakness Skin: (-) rashes, (-) itching,. Eyes: (-) visual changes, (-) redness, (-) itching. Nose and Sinuses: (-) nasal stuffiness or itchiness, (-) postnasal drip, (-) nosebleeds, (-) sinus trouble. Mouth and Throat: (-) sore throat, (-) hoarseness. Neck: (-) swollen glands, (-) enlarged thyroid, (-) neck pain. Respiratory: - cough, (-) bloody sputum, - shortness of breath, - wheezing. Cardiovascular: - ankle swelling, (-) chest pain. Lymphatic: (-) lymph node enlargement. Neurologic: (-) numbness, (-) tingling. Psychiatric: (-) anxiety, (-) depression   Current Medication: Outpatient Encounter Medications as of 04/09/2020  Medication Sig   acetaminophen (TYLENOL) 500 MG tablet Take 1,000 mg by mouth every 6 (six) hours as needed for moderate pain.   albuterol (PROVENTIL) (2.5 MG/3ML) 0.083% nebulizer solution Inhale 3 mLs (2.5 mg total) into the lungs every 6 (six) hours as needed for wheezing or shortness of breath.   ATROVENT HFA 17 MCG/ACT inhaler Inhale 2 puffs into the lungs every 6 (six) hours as needed for wheezing.   BREO ELLIPTA 100-25 MCG/INH AEPB Inhale 1 puff into the lungs daily.   clonazePAM (KLONOPIN) 1 MG tablet Take 1 mg by mouth 3 (three) times daily as needed.   COMBIVENT RESPIMAT 20-100 MCG/ACT AERS respimat INHALE 2 PUFFS INTO LUNGS EVERY 4 HOURS.   EPIPEN 2-PAK 0.3 MG/0.3ML SOAJ  injection 0.3 mg.    escitalopram (LEXAPRO) 20 MG tablet TAKE 1 TABLET BY MOUTH ONCE DAILY.   fexofenadine (ALLEGRA) 180 MG tablet Take 180 mg by mouth daily as needed for allergies.    fluticasone (FLONASE) 50 MCG/ACT nasal spray Place 1 spray into both nostrils daily.   loratadine (CLARITIN REDITABS) 10 MG dissolvable tablet Take 10 mg by mouth daily.   OXYGEN Inhale into the lungs. 2LITERS AT NIGHT AND WHEN SICK   propranolol (INDERAL) 10 MG tablet Take 1 tablet (10 mg total) by mouth 2 (two) times daily as needed. FOR SEVERE ANXIETY ATTACKS   fluticasone (FLOVENT HFA) 220 MCG/ACT inhaler Inhale 2 puffs into the lungs 2 (two) times daily.   omeprazole (PRILOSEC) 40 MG capsule Take 1 capsule (40 mg total) by mouth in the morning and at bedtime.   No facility-administered encounter medications on file as of 04/09/2020.    Surgical History: Past Surgical History:  Procedure Laterality Date   ESOPHAGOGASTRODUODENOSCOPY N/A 02/18/2019   Procedure: ESOPHAGOGASTRODUODENOSCOPY (EGD);  Surgeon: Toledo, Boykin Nearing, MD;  Location: [facility] ENDOSCOPY;  Service: Gastroenterology;  Laterality: N/A;   ESOPHAGOGASTRODUODENOSCOPY (EGD) WITH PROPOFOL N/A 09/05/2017   Procedure: ESOPHAGOGASTRODUODENOSCOPY (EGD) WITH PROPOFOL;  Surgeon: Toledo, Boykin Nearing, MD;  Location: [facility] ENDOSCOPY;  Service: Gastroenterology;  Laterality: N/A;   ESOPHAGOGASTRODUODENOSCOPY (EGD) WITH PROPOFOL N/A 01/22/2020   Procedure: ESOPHAGOGASTRODUODENOSCOPY (EGD) WITH PROPOFOL;  Surgeon: Toledo, Boykin Nearing, MD;  Location: [facility] ENDOSCOPY;  Service: Gastroenterology;  Laterality: N/A;   FOREIGN BODY REMOVAL     FOREIGN BODY REMOVAL N/A 12/16/2019  Procedure: FOREIGN BODY REMOVAL;  Surgeon: Toney Reil, MD;  Location[facility] ENDOSCOPY;  Service: Gastroenterology;  Laterality: N/A;   none     TUBES IN EARS      Medical History: Past Medical History:  Diagnosis Date   Anxiety    Arthritis    knee   Asthma     Chronic mental illness    COPD (chronic obstructive pulmonary disease) (HCC)    Depression    Eosinophilic esophagitis    GERD (gastroesophageal reflux disease)    Heart palpitations    Hiatal hernia    Panic attack    Pulmonary fibrosis (HCC)     Family History: Family History  Problem Relation Age of Onset   COPD Mother    Asthma Mother    Mental illness Mother    COPD Father    Pulmonary fibrosis Father    Lung cancer Father     Social History: Social History   Socioeconomic History   Marital status: Married    Spouse name: heather   Number of children: 4   Years of education: Not on file   Highest education level: Associate degree: occupational, Scientist, product/process development, or vocational program  Occupational History   Not on file  Tobacco Use   Smoking status: Former Smoker    Packs/day: 0.50    Years: 2.00    Pack years: 1.00    Types: Cigarettes    Quit date: 10/31/2000    Years since quitting: 19.4   Smokeless tobacco: Current User    Types: Chew   Tobacco comment: 2003  Vaping Use   Vaping Use: Never used  Substance and Sexual Activity   Alcohol use: No    Alcohol/week: 0.0 standard drinks   Drug use: No   Sexual activity: Yes  Other Topics Concern   Not on file  Social History Narrative   Not on file   Social Determinants of Health   Financial Resource Strain:    Difficulty of Paying Living Expenses:   Food Insecurity:    Worried About Programme researcher, broadcasting/film/video in the Last Year:    Barista in the Last Year:   Transportation Needs:    Freight forwarder (Medical):    Lack of Transportation (Non-Medical):   Physical Activity:    Days of Exercise per Week:    Minutes of Exercise per Session:   Stress:    Feeling of Stress :   Social Connections:    Frequency of Communication with Friends and Family:    Frequency of Social Gatherings with Friends and Family:    Attends Religious Services:    Active Member of  Clubs or Organizations:    Attends Engineer, structural:    Marital Status:   Intimate Partner Violence:    Fear of Current or Ex-Partner:    Emotionally Abused:    Physically Abused:    Sexually Abused:     Vital Signs: Blood pressure (!) 147/77, pulse 96, temperature 97.7 F (36.5 C), resp. rate 16, height 5\' 7"  (1.702 m), weight 241 lb 3.2 oz (109.4 kg), SpO2 98 %.  Examination: General Appearance: The patient is well-developed, well-nourished, and in no distress. Skin: Gross inspection of skin unremarkable. Head: normocephalic, no gross deformities. Eyes: no gross deformities noted. ENT: ears appear grossly normal no exudates. Neck: Supple. No thyromegaly. No LAD. Respiratory: clear bilaterally. Cardiovascular: Normal S1 and S2 without murmur or rub. Extremities: No cyanosis. pulses are equal. Neurologic: Alert  and oriented. No involuntary movements.  LABS: Recent Results (from the past 2160 hour(s))  SARS CORONAVIRUS 2 (TAT 6-24 HRS) Nasopharyngeal Nasopharyngeal Swab     Status: None   Collection Time: 01/17/20  1:56 PM   Specimen: Nasopharyngeal Swab  Result Value Ref Range   SARS Coronavirus 2 NEGATIVE NEGATIVE    Comment: (NOTE) SARS-CoV-2 target nucleic acids are NOT DETECTED. The SARS-CoV-2 RNA is generally detectable in upper and lower respiratory specimens during the acute phase of infection. Negative results do not preclude SARS-CoV-2 infection, do not rule out co-infections with other pathogens, and should not be used as the sole basis for treatment or other patient management decisions. Negative results must be combined with clinical observations, patient history, and epidemiological information. The expected result is Negative. Fact Sheet for Patients: SugarRoll.be Fact Sheet for Healthcare Providers: https://www.woods-mathews.com/ This test is not yet approved or cleared by the [place] FDA  and  has been authorized for detection and/or diagnosis of SARS-CoV-2 by FDA under an Emergency Use Authorization (EUA). This EUA will remain  in effect (meaning this test can be used) for the duration of the COVID-19 declaration under Section 56 4(b)(1) of the Act, 21 U.S.C. section 360bbb-3(b)(1), unless the authorization is terminated or revoked sooner. Performed at [facility], [place] [address], [place], [place] [address]     Radiology: No results found.  No results found.  No results found.    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/2019   Intermittent explosive disorder in adult 05/08/2019   Gastroesophageal reflux disease without esophagitis 02/18/2019   PVC's (premature ventricular contractions) 01/31/2019   Allergy to meat 10/11/2018   Chronic left upper quadrant pain 10/11/2018   Functional dyspepsia 10/11/2018   Heart palpitations 05/31/2018   Anxiety, generalized 05/31/2018   Asthma, chronic 04/05/2015   Chronic obstructive airway disease with asthma ([place]) 03/17/2015   Obesity 03/17/2015   DOE (dyspnea on exertion) 03/17/2015   Environmental allergies 04/03/2014   Chest pain 08/02/2013   Thyroid nodule 03/21/2013   1. Obstructive chronic bronchitis without exacerbation ([place]) Good control at this time.   2. Dependence on nocturnal oxygen therapy Continue to to wear oxygen as directed.    3. Seasonal allergic rhinitis due to pollen Stable, continue allergy medications as directed.   4. GAD (generalized anxiety disorder) Anxious about upcoming  procedure, but manageable at this time.   5. Shortness of breath - Spirometry with Graph  6. Encounter for preoperative examination for general surgical procedure Stable FEV1 patient is clear for iv sedation during teeth extraction.  As always, it is at discretion of anaesthesia on the day of procedure.  Patient should have oxygen saturation monitored closely, and oxygen use at all times during procedure.    General Counseling: I have discussed the findings of the evaluation and examination with Louie Casa.  I have also discussed any further diagnostic evaluation thatmay be needed or ordered today. Henderson verbalizes understanding of the findings of todays visit. We also reviewed his medications today and discussed drug interactions and side effects including but not limited excessive drowsiness and altered mental states. We  also discussed that there is always a risk not just to him but also people around him. he has been encouraged to call the office with any questions or concerns that should arise related to todays visit.  Orders Placed This Encounter  Procedures   Spirometry with Graph    Order Specific Question:   Where should this test be performed?    Answer:   [facility]     Time spent: 30 This patient was seen by Blima Ledger AGNP-C in Collaboration with Dr. Freda Munro as a part of collaborative care agreement.   I have personally obtained a history, examined the patient, evaluated laboratory and imaging results, formulated the assessment and plan and placed orders.    Yevonne Pax, MD [facility] Pulmonary and Critical Care Sleep medicine

## 2020-05-21 ENCOUNTER — Emergency Department
Admission: EM | Admit: 2020-05-21 | Discharge: 2020-05-21 | Disposition: A | Discharge status: Home / Self Care | Payer: Medicare Other | Attending: Emergency Medicine | Admitting: Emergency Medicine

## 2020-05-21 ENCOUNTER — Emergency Department: Payer: Medicare Other

## 2020-05-21 ENCOUNTER — Encounter: Payer: Self-pay | Admitting: Emergency Medicine

## 2020-05-21 ENCOUNTER — Other Ambulatory Visit: Payer: Self-pay

## 2020-05-21 DIAGNOSIS — Z87891 Personal history of nicotine dependence: Secondary | ICD-10-CM | POA: Insufficient documentation

## 2020-05-21 DIAGNOSIS — J449 Chronic obstructive pulmonary disease, unspecified: Secondary | ICD-10-CM | POA: Insufficient documentation

## 2020-05-21 DIAGNOSIS — J45909 Unspecified asthma, uncomplicated: Secondary | ICD-10-CM | POA: Insufficient documentation

## 2020-05-21 DIAGNOSIS — Z79899 Other long term (current) drug therapy: Secondary | ICD-10-CM | POA: Insufficient documentation

## 2020-05-21 DIAGNOSIS — Z7951 Long term (current) use of inhaled steroids: Secondary | ICD-10-CM | POA: Insufficient documentation

## 2020-05-21 DIAGNOSIS — R079 Chest pain, unspecified: Secondary | ICD-10-CM | POA: Insufficient documentation

## 2020-05-21 LAB — BASIC METABOLIC PANEL
Anion gap: 9 (ref 5–15)
BUN: 6 mg/dL (ref 6–20)
CO2: 26 mmol/L (ref 22–32)
Calcium: 9.3 mg/dL (ref 8.9–10.3)
Chloride: 103 mmol/L (ref 98–111)
Creatinine, Ser: 0.86 mg/dL (ref 0.61–1.24)
GFR calc Af Amer: >60 mL/min (ref >60)
GFR calc non Af Amer: >60 mL/min (ref >60)
Glucose, Bld: 88 mg/dL (ref 70–99)
Potassium: 3.7 mmol/L (ref 3.5–5.1)
Sodium: 138 mmol/L (ref 135–145)

## 2020-05-21 LAB — CBC
HCT: 42.3 % (ref 39.0–52.0)
Hemoglobin: 14.1 g/dL (ref 13.0–17.0)
MCH: 29.3 pg (ref 26.0–34.0)
MCHC: 33.3 g/dL (ref 30.0–36.0)
MCV: 87.8 fL (ref 80.0–100.0)
Platelets: 371 10*3/uL (ref 150–400)
RBC: 4.82 MIL/uL (ref 4.22–5.81)
RDW: 12.3 % (ref 11.5–15.5)
WBC: 7.5 10*3/uL (ref 4.0–10.5)
nRBC: 0 % (ref 0.0–0.2)

## 2020-05-21 LAB — TROPONIN I (HIGH SENSITIVITY)
Troponin I (High Sensitivity): 9 ng/L (ref <18)
Troponin I (High Sensitivity): 8 ng/L (ref <18)

## 2020-05-21 NOTE — Discharge Instructions (Addendum)
Please seek medical attention for any high fevers, chest pain, shortness of breath, change in behavior, persistent vomiting, bloody stool or any other new or concerning symptoms.  

## 2020-05-21 NOTE — ED Triage Notes (Signed)
Patient presents to the ED with intermittent chest pain x 3 days.  Patient reports some shortness of breath and some left arm pain as well.  Patient states pain began when he had been outside doing yard work.  Patient states he checked his pulse at that time and it was in the 150s.  Patient states he checked his pulse today and it was low.

## 2020-05-21 NOTE — ED Provider Notes (Signed)
[facility] Emergency Department Provider Note   ____________________________________________   I have reviewed the triage vital signs and the nursing notes.   HISTORY  Chief Complaint Chest Pain   History limited by: Not Limited   HPI Steven [name] is a 39 y.o. male who presents to the emergency department today because of concern for abnormal heart rate and chest pain. The patient states that the symptoms first started a few days ago when he was working outside. Noticed that his heart rate was elevated. When he checked it it was 150. Went inside and rested, tried hydrating but his heart rate remained elevated around 120. Also started developing some left sided pressure type chest pain. Did have pain in his left arm as well. Does have history of asthma and has some shortness of breath with exertion. Today the patient became concerned because his heart rate was very low. The patient says he has history of tachycardia and was evaluated by cardiology and had stress test roughly 1 year ago.    Records reviewed. Per medical record review patient has a history of COPD  Past Medical History:  Diagnosis Date  . Anxiety   . Arthritis    knee  . Asthma   . Chronic mental illness   . COPD (chronic obstructive pulmonary disease) (HCC)   . Depression   . Eosinophilic esophagitis   . GERD (gastroesophageal reflux disease)   . Heart palpitations   . Hiatal hernia   . Panic attack   . Pulmonary fibrosis [facility])     Patient Active Problem List   Diagnosis Date Noted  . Foreign body in esophagus   . Eosinophilic esophagitis   . Dysphagia 12/15/2019  . High risk medication use 07/18/2019  . Flexor tendon laceration, wrist, open wound, left, subsequent encounter 05/20/2019  . GAD (generalized anxiety disorder) 05/08/2019  . Panic disorder 05/08/2019  . MDD (major depressive disorder), recurrent episode, mild (HCC) 05/08/2019  . Intermittent explosive disorder  in adult 05/08/2019  . Gastroesophageal reflux disease without esophagitis 02/18/2019  . PVC's (premature ventricular contractions) 01/31/2019  . Allergy to meat 10/11/2018  . Chronic left upper quadrant pain 10/11/2018  . 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 Heart palpitations 05/31/2018  . Anxiety, generalized 05/31/2018  . Asthma, chronic 04/05/2015  . Chronic obstructive airway disease with asthma (HCC) 03/17/2015  . Obesity 03/17/2015  . DOE (dyspnea on exertion) 03/17/2015  . Environmental allergies 04/03/2014  . Chest pain 08/02/2013  . Thyroid nodule 03/21/2013    Past Surgical History:  Procedure Laterality Date  . ESOPHAGOGASTRODUODENOSCOPY N/A 02/18/2019   Procedure: ESOPHAGOGASTRODUODENOSCOPY (EGD);  Surgeon: Toledo, Boykin Nearing, MD;  Location: [facility] ENDOSCOPY;  Service: Gastroenterology;  Laterality: N/A;  . ESOPHAGOGASTRODUODENOSCOPY (EGD) WITH PROPOFOL N/A 09/05/2017   Procedure: ESOPHAGOGASTRODUODENOSCOPY (EGD) WITH PROPOFOL;  Surgeon: Toledo, Boykin Nearing, MD;  Location: [facility] ENDOSCOPY;  Service: Gastroenterology;  Laterality: N/A;  . ESOPHAGOGASTRODUODENOSCOPY (EGD) WITH PROPOFOL N/A 01/22/2020   Procedure: ESOPHAGOGASTRODUODENOSCOPY (EGD) WITH PROPOFOL;  Surgeon: Toledo, Boykin Nearing, MD;  Location: [facility] ENDOSCOPY;  Service: Gastroenterology;  Laterality: N/A;  . FOREIGN BODY REMOVAL    . FOREIGN BODY REMOVAL N/A 12/16/2019   Procedure: FOREIGN BODY REMOVAL;  Surgeon: Toney Reil, MD;  Location: [facility] ENDOSCOPY;  Service: Gastroenterology;  Laterality: N/A;  . none    . TUBES IN EARS      Prior to Admission medications   Medication Sig Start Date End Date Taking? Authorizing Provider  acetaminophen (TYLENOL) 500 MG tablet  Take 1,000 mg by mouth every 6 (six) hours as needed for moderate pain.    [provider]  albuterol (PROVENTIL) (2.5 MG/3ML) 0.083% nebulizer solution Inhale 3 mLs (2.5 mg total) into the lungs every 6 (six) hours as needed for wheezing or  shortness of breath. 07/10/18   Johnna Acosta, NP  ATROVENT HFA 17 MCG/ACT inhaler Inhale 2 puffs into the lungs every 6 (six) hours as needed for wheezing. 07/10/18   Scarboro, Coralee North, NP  BREO ELLIPTA 100-25 MCG/INH AEPB Inhale 1 puff into the lungs daily. 07/10/18   Scarboro, Coralee North, NP  clonazePAM (KLONOPIN) 1 MG tablet Take 1 mg by mouth 3 (three) times daily as needed. 10/31/19   [provider]  COMBIVENT RESPIMAT 20-100 MCG/ACT AERS respimat INHALE 2 PUFFS INTO LUNGS EVERY 4 HOURS. 04/06/20   Johnna Acosta, NP  EPIPEN 2-PAK 0.3 MG/0.3ML SOAJ injection 0.3 mg.  12/03/15   [provider]  escitalopram (LEXAPRO) 20 MG tablet TAKE 1 TABLET BY MOUTH ONCE DAILY. 07/16/19   Jomarie Longs, MD  fexofenadine (ALLEGRA) 180 MG tablet Take 180 mg by mouth daily as needed for allergies.     [provider]  fluticasone (FLONASE) 50 MCG/ACT nasal spray Place 1 spray into both nostrils daily. 06/23/15   [provider]  fluticasone (FLOVENT HFA) 220 MCG/ACT inhaler Inhale 2 puffs into the lungs 2 (two) times daily. 12/17/19 01/16/20  Charise Killian, MD  loratadine (CLARITIN REDITABS) 10 MG dissolvable tablet Take 10 mg by mouth daily.    [provider]  omeprazole (PRILOSEC) 40 MG capsule Take 1 capsule (40 mg total) by mouth in the morning and at bedtime. 12/17/19 01/16/20  Charise Killian, MD  OXYGEN Inhale into the lungs. 2LITERS AT NIGHT AND WHEN SICK    [provider]  propranolol (INDERAL) 10 MG tablet Take 1 tablet (10 mg total) by mouth 2 (two) times daily as needed. FOR SEVERE ANXIETY ATTACKS 03/04/19   Jomarie Longs, MD    Allergies Diphenhydramine, Guaifenesin, Beef-derived products, Doxycycline, Pork-derived products, and Amoxicillin  Family History  Problem Relation Age of Onset  . COPD Mother   . Asthma Mother   . Mental illness Mother   . COPD Father   . Pulmonary fibrosis Father   . Lung cancer Father     Social  History Social History   Tobacco Use  . Smoking status: Former Smoker    Packs/day: 0.50    Years: 2.00    Pack years: 1.00    Types: Cigarettes    Quit date: 10/31/2000    Years since quitting: 19.5  . Smokeless tobacco: Current User    Types: Chew  . Tobacco comment: 2003  Vaping Use  . Vaping Use: Never used  Substance Use Topics  . Alcohol use: No    Alcohol/week: 0.0 standard drinks  . Drug use: No    Review of Systems Constitutional: No fever/chills Eyes: No visual changes. ENT: No sore throat. Cardiovascular: Positive for fast and slow heart rates and chest pain. Respiratory: Positive for shortness of breath. Gastrointestinal: No abdominal pain.  No nausea, no vomiting.  No diarrhea.   Genitourinary: Negative for dysuria. Musculoskeletal: Negative for back pain. Skin: Negative for rash. Neurological: Negative for headaches, focal weakness or numbness.  ____________________________________________   PHYSICAL EXAM:  VITAL SIGNS: ED Triage Vitals  Enc Vitals Group     BP 05/21/20 1439 113/78     Pulse Rate 05/21/20 1439  72     Resp 05/21/20 1439 21     Temp 05/21/20 1439 98.3 F (36.8 C)     Temp Source 05/21/20 1439 Oral     SpO2 05/21/20 1439 100 %     Weight 05/21/20 1441 (!) 236 lb (107 kg)     Height 05/21/20 1441 5\' 7"  (1.702 m)   Constitutional: Alert and oriented.  Eyes: Conjunctivae are normal.  ENT      Head: Normocephalic and atraumatic.      Nose: No congestion/rhinnorhea.      Mouth/Throat: Mucous membranes are moist.      Neck: No stridor. Hematological/Lymphatic/Immunilogical: No cervical lymphadenopathy. Cardiovascular: Normal rate, regular rhythm.  No murmurs, rubs, or gallops.  Respiratory: Normal respiratory effort without tachypnea nor retractions. Breath sounds are clear and equal bilaterally. No wheezes/rales/rhonchi. Gastrointestinal: Soft and non tender. No rebound. No guarding.  Genitourinary: Deferred Musculoskeletal: Normal  range of motion in all extremities. No lower extremity edema. Neurologic:  Normal speech and language. No gross focal neurologic deficits are appreciated.  Skin:  Skin is warm, dry and intact. No rash noted. Psychiatric: Mood and affect are normal. Speech and behavior are normal. Patient exhibits appropriate insight and judgment.  ____________________________________________    LABS (pertinent positives/negatives)  Trop hs 9 CBC wbc 7.5, hgb 14.1, plt 371 BMP wnl  ____________________________________________   EKG  I, , attending physician, personally viewed and interpreted this EKG  EKG Time: 1435 Rate: 69 Rhythm: sinus rhythm with frequent pacs Axis: normal Intervals: qtc 411 QRS: narrow ST changes: no st elevation Impression: abnormal ekg ____________________________________________    RADIOLOGY  CXR No acute abnormality  ____________________________________________   PROCEDURES  Procedures  ____________________________________________   INITIAL IMPRESSION / ASSESSMENT AND PLAN / ED COURSE  Pertinent labs & imaging results that were available during my care of the patient were reviewed by me and considered in my medical decision making (see chart for details).   Patient presented to the emergency department today because of concerns for abnormal heart rate as well as some chest pain.  The time my exam the patient heart rate is in a normal rhythm and rate.  Patient's blood work was negative for troponin x2.  Chest x-ray and repeat EKG without concerning findings.  This point unclear etiology the patient's symptoms however have low suspicion for ACS.  Discussed with patient importance of follow-up with his cardiologist.  ____________________________________________   FINAL CLINICAL IMPRESSION(S) / ED DIAGNOSES  Final diagnoses:  Nonspecific chest pain     Note: This dictation was prepared with Dragon dictation. Any transcriptional errors  that result from this process are unintentional     Phineas Semen, MD 05/21/20 1819

## 2020-05-29 ENCOUNTER — Other Ambulatory Visit: Payer: Self-pay | Admitting: Adult Health

## 2020-06-01 ENCOUNTER — Other Ambulatory Visit: Payer: Self-pay

## 2020-06-04 ENCOUNTER — Other Ambulatory Visit: Payer: Self-pay | Admitting: Adult Health

## 2020-06-04 ENCOUNTER — Other Ambulatory Visit: Payer: Self-pay

## 2020-06-04 MED ORDER — ATROVENT HFA 17 MCG/ACT IN AERS: 2.0000 | INHALATION_SPRAY | Freq: Four times a day (QID) | RESPIRATORY_TRACT | 0 refills | Status: DC | PRN

## 2020-06-22 ENCOUNTER — Other Ambulatory Visit: Payer: Self-pay | Admitting: Adult Health

## 2020-06-22 ENCOUNTER — Telehealth: Payer: Self-pay

## 2020-06-22 MED ORDER — CETIRIZINE HCL 5 MG/5ML PO SOLN
5.0000 mg | Freq: Every day | ORAL | 0 refills | Status: DC
Start: 2020-06-22 — End: 2020-09-01

## 2020-06-22 NOTE — Telephone Encounter (Signed)
pt called and advised that he is having very bad congested, sneezing and watery eyes, he has been taking claritin and it is not helping at all. he states its been 8 days and not any better. wants to know if there is a different allergy medication or something that can help. he states he is allergic top benadryl and robitussin. HE ALSO NEEDS MEDICATION THAT CAN BE CRUSHED, DISOLVED OR LIQUID. Renee Ramus is [place], call back is [phone].  Pt informed that zyrtec sent to pharmacy.,  dbs

## 2020-06-22 NOTE — Progress Notes (Signed)
Sent Liquid zyrtec for patient.

## 2020-07-09 ENCOUNTER — Telehealth: Payer: Self-pay

## 2020-07-09 NOTE — Telephone Encounter (Signed)
Pt called and left VM that he has been taking zyrtec 5 Mls and advised that it wasn't helping.  I spoke to Madelaine Bhat and he advised that pt try taking 10 Mls of the zyrtec for about a week and see how uit does.  I called and spoke to pt's wife and informed her of the change and told her if that didn't help that pt would need to make an appointment.

## 2020-07-10 ENCOUNTER — Telehealth: Payer: Self-pay

## 2020-07-10 NOTE — Telephone Encounter (Signed)
LMOM for office visit on 9/13 

## 2020-07-13 ENCOUNTER — Ambulatory Visit: Payer: Medicare Other | Admitting: Adult Health

## 2020-07-14 ENCOUNTER — Telehealth: Payer: Self-pay

## 2020-07-14 NOTE — Telephone Encounter (Signed)
BILLED MISSED APPT FEE 07/13/20 °

## 2020-08-24 ENCOUNTER — Other Ambulatory Visit: Payer: Self-pay | Admitting: Adult Health

## 2020-08-24 ENCOUNTER — Telehealth: Payer: Self-pay

## 2020-08-24 NOTE — Telephone Encounter (Signed)
Pt's wife called and asked for [place] to send prescription for nebulizer to his pharmacy, pt had several missed appt's and I advised that pt will need to schedule appt to get new order for nebulizer machine.  Transferred her to appt desk.

## 2020-09-01 ENCOUNTER — Ambulatory Visit (INDEPENDENT_AMBULATORY_CARE_PROVIDER_SITE_OTHER): Payer: Medicare Other | Admitting: Internal Medicine

## 2020-09-01 ENCOUNTER — Encounter: Payer: Self-pay | Admitting: Internal Medicine

## 2020-09-01 ENCOUNTER — Other Ambulatory Visit: Payer: Self-pay

## 2020-09-01 DIAGNOSIS — Z91018 Allergy to other foods: Secondary | ICD-10-CM | POA: Diagnosis not present

## 2020-09-01 DIAGNOSIS — J209 Acute bronchitis, unspecified: Secondary | ICD-10-CM

## 2020-09-01 DIAGNOSIS — J449 Chronic obstructive pulmonary disease, unspecified: Secondary | ICD-10-CM

## 2020-09-01 DIAGNOSIS — Z9981 Dependence on supplemental oxygen: Secondary | ICD-10-CM | POA: Diagnosis not present

## 2020-09-01 DIAGNOSIS — R0602 Shortness of breath: Secondary | ICD-10-CM

## 2020-09-01 DIAGNOSIS — J4540 Moderate persistent asthma, uncomplicated: Secondary | ICD-10-CM

## 2020-09-01 MED ORDER — ALBUTEROL SULFATE (2.5 MG/3ML) 0.083% IN NEBU: 2.5000 mg | INHALATION_SOLUTION | Freq: Four times a day (QID) | RESPIRATORY_TRACT | 4 refills | Status: AC | PRN

## 2020-09-01 MED ORDER — CETIRIZINE HCL 5 MG/5ML PO SOLN: 15.0000 mg | Freq: Every day | ORAL | 0 refills | Status: DC

## 2020-09-01 MED ORDER — FLUTICASONE PROPIONATE 50 MCG/ACT NA SUSP: 1.0000 | Freq: Every day | NASAL | 2 refills | Status: AC

## 2020-09-01 MED ORDER — FLOVENT HFA 220 MCG/ACT IN AERO: 2.0000 | INHALATION_SPRAY | Freq: Two times a day (BID) | RESPIRATORY_TRACT | 3 refills | Status: AC

## 2020-09-01 MED ORDER — COMBIVENT RESPIMAT 20-100 MCG/ACT IN AERS: INHALATION_SPRAY | RESPIRATORY_TRACT | 4 refills | Status: DC

## 2020-09-01 MED ORDER — ATROVENT HFA 17 MCG/ACT IN AERS: 2.0000 | INHALATION_SPRAY | Freq: Four times a day (QID) | RESPIRATORY_TRACT | 0 refills | Status: AC | PRN

## 2020-09-01 MED ORDER — ALBUTEROL SULFATE HFA 108 (90 BASE) MCG/ACT IN AERS: INHALATION_SPRAY | RESPIRATORY_TRACT | 0 refills | Status: AC

## 2020-09-01 NOTE — Progress Notes (Signed)
[facility] [address] [place], [place] [address]  Pulmonary Sleep Medicine   Office Visit Note  Patient Name: Steven [name] DOB: 02-03-1981 MRN [id number]  Date of Service: 09/04/2020  Complaints/HPI: Patient is here for routine pulmonary follow-up History of asthma, COPD as well as pulmonary fibrosis? Currently using Breo, Combivent, as well as albuterol to control his [facility] and wheezing--reports stability at this time, does frequently require albuterol His current nebulizer machine is old and at times does not function properly Also currently using Zyrtec as well as Flonase to control his allergies Has had several hospitalizations in the past requiring mechanical ventilation, last hospitalization 03/24 History of eosinophilic esophagitis--managed by GI, unable to tolerate PO pills, all medications must be crushed or in liquid form Continues to wear oxygen at night for nocturnal hypoxia Last PFT 03/10--FEV1 2.55L, 65% predicted, mild obstructive lung disease, DLCO normal  Requesting to have alpha 1 testing completed to monitor his levels due to alpha gal allergy  ROS  General: (-) fever, (-) chills, (-) night sweats, (-) weakness Skin: (-) rashes, (-) itching,. Eyes: (-) visual changes, (-) redness, (-) itching. Nose and Sinuses: (-) nasal stuffiness or itchiness, (-) postnasal drip, (-) nosebleeds, (-) sinus trouble. Mouth and Throat: (-) sore throat, (-) hoarseness. Neck: (-) swollen glands, (-) enlarged thyroid, (-) neck pain. Respiratory: + cough, (-) bloody sputum, - shortness of breath, + wheezing. Cardiovascular: - ankle swelling, (-) chest pain. Lymphatic: (-) lymph node enlargement. Neurologic: (-) numbness, (-) tingling. Psychiatric: (-) anxiety, (-) depression   Current Medication: Outpatient Encounter Medications as of 09/01/2020  Medication Sig  . acetaminophen (TYLENOL) 500 MG tablet Take 1,000 mg by mouth every 6 (six) hours as needed for  moderate pain.  Marland Kitchen albuterol (PROVENTIL) (2.5 MG/3ML) 0.083% nebulizer solution Inhale 3 mLs (2.5 mg total) into the lungs every 6 (six) hours as needed for wheezing or shortness of breath.  Marland Kitchen albuterol (VENTOLIN HFA) 108 (90 Base) MCG/ACT inhaler INHALE 2 PUFFS BY MOUTH EVERY 4-6 HOURS  . ATROVENT HFA 17 MCG/ACT inhaler Inhale 2 puffs into the lungs every 6 (six) hours as needed for wheezing.  Marland Kitchen BREO ELLIPTA 100-25 MCG/INH AEPB Inhale 1 puff into the lungs daily.  . cetirizine HCl (ZYRTEC) 5 MG/5ML SOLN Take 15 mLs (15 mg total) by mouth daily.  . clonazePAM (KLONOPIN) 1 MG tablet Take 1 mg by mouth 3 (three) times daily as needed.  . COMBIVENT RESPIMAT 20-100 MCG/ACT AERS respimat Use as directed  . EPIPEN 2-PAK 0.3 MG/0.3ML SOAJ injection 0.3 mg.   . escitalopram (LEXAPRO) 20 MG tablet TAKE 1 TABLET BY MOUTH ONCE DAILY.  . fluticasone (FLONASE) 50 MCG/ACT nasal spray Place 1 spray into both nostrils daily.  . OXYGEN Inhale into the lungs. 2LITERS AT NIGHT AND WHEN SICK  . [DISCONTINUED] albuterol (PROVENTIL) (2.5 MG/3ML) 0.083% nebulizer solution Inhale 3 mLs (2.5 mg total) into the lungs every 6 (six) hours as needed for wheezing or shortness of breath.  . [DISCONTINUED] albuterol (VENTOLIN HFA) 108 (90 Base) MCG/ACT inhaler INHALE 2 PUFFS BY MOUTH EVERY 4-6 HOURS  . [DISCONTINUED] ATROVENT HFA 17 MCG/ACT inhaler Inhale 2 puffs into the lungs every 6 (six) hours as needed for wheezing.  . [DISCONTINUED] cetirizine HCl (ZYRTEC) 5 MG/5ML SOLN Take 5 mLs (5 mg total) by mouth daily.  . [DISCONTINUED] COMBIVENT RESPIMAT 20-100 MCG/ACT AERS respimat INHALE 2 PUFFS INTO LUNGS EVERY 4 HOURS.  . [DISCONTINUED] fexofenadine (ALLEGRA) 180 MG tablet Take 180 mg by  mouth daily as needed for allergies.   . [DISCONTINUED] fluticasone (FLONASE) 50 MCG/ACT nasal spray Place 1 spray into both nostrils daily.  . [DISCONTINUED] loratadine (CLARITIN REDITABS) 10 MG dissolvable tablet Take 10 mg by mouth daily.  .  [DISCONTINUED] propranolol (INDERAL) 10 MG tablet Take 1 tablet (10 mg total) by mouth 2 (two) times daily as needed. FOR SEVERE ANXIETY ATTACKS  . fluticasone (FLOVENT HFA) 220 MCG/ACT inhaler Inhale 2 puffs into the lungs 2 (two) times daily.  Marland Kitchen. KAPSPARGO SPRINKLE 25 MG CS24 Take 1 capsule by mouth daily.  Marland Kitchen. omeprazole (PRILOSEC) 40 MG capsule Take 1 capsule (40 mg total) by mouth in the morning and at bedtime.  . [DISCONTINUED] fluticasone (FLOVENT HFA) 220 MCG/ACT inhaler Inhale 2 puffs into the lungs 2 (two) times daily.   No facility-administered encounter medications on file as of 09/01/2020.    Surgical History: Past Surgical History:  Procedure Laterality Date  . ESOPHAGOGASTRODUODENOSCOPY N/A 02/18/2019   Procedure: ESOPHAGOGASTRODUODENOSCOPY (EGD);  Surgeon: Toledo, Boykin Nearingeodoro K, MD;  Location: [facility] ENDOSCOPY;  Service: Gastroenterology;  Laterality: N/A;  . ESOPHAGOGASTRODUODENOSCOPY (EGD) WITH PROPOFOL N/A 09/05/2017   Procedure: ESOPHAGOGASTRODUODENOSCOPY (EGD) WITH PROPOFOL;  Surgeon: Toledo, Boykin Nearingeodoro K, MD;  Location: [facility] ENDOSCOPY;  Service: Gastroenterology;  Laterality: N/A;  . ESOPHAGOGASTRODUODENOSCOPY (EGD) WITH PROPOFOL N/A 01/22/2020   Procedure: ESOPHAGOGASTRODUODENOSCOPY (EGD) WITH PROPOFOL;  Surgeon: Toledo, Boykin Nearingeodoro K, MD;  Location: [facility] ENDOSCOPY;  Service: Gastroenterology;  Laterality: N/A;  . FOREIGN BODY REMOVAL    . FOREIGN BODY REMOVAL N/A 12/16/2019   Procedure: FOREIGN BODY REMOVAL;  Surgeon: Toney ReilVanga, Rohini Reddy, MD;  [facility];  Service: Gastroenterology;  Laterality: N/A;  . none    . TUBES IN EARS      Medical History: Past Medical History:  Diagnosis Date  . Anxiety   . Arthritis    knee  . Asthma   . Chronic mental illness   . COPD (chronic obstructive pulmonary disease) (HCC)   . Depression   . Eosinophilic esophagitis   . GERD (gastroesophageal reflux disease)   . Heart palpitations   . Hiatal hernia   . Panic attack   .  Pulmonary fibrosis (HCC)     Family History: Family History  Problem Relation Age of Onset  . COPD Mother   . Asthma Mother   . Mental illness Mother   . COPD Father   . Pulmonary fibrosis Father   . Lung cancer Father     Social History: Social History   Socioeconomic History  . Marital status: Married    Spouse name: heather  . Number of children: 4  . Years of education: Not on file  . Highest education level: Associate degree: occupational, Scientist, product/process developmenttechnical, or vocational program  Occupational History  . Not on file  Tobacco Use  . Smoking status: Former Smoker    Packs/day: 0.50    Years: 2.00    Pack years: 1.00    Types: Cigarettes    Quit date: 10/31/2000    Years since quitting: 19.8  . Smokeless tobacco: Current User    Types: Chew  . Tobacco comment: 2003  Vaping Use  . Vaping Use: Never used  Substance and Sexual Activity  . Alcohol use: No    Alcohol/week: 0.0 standard drinks  . Drug use: No  . Sexual activity: Yes  Other Topics Concern  . Not on file  Social History Narrative  . Not on file   Social Determinants of Health   Financial Resource  Strain:   . Difficulty of Paying Living Expenses: Not on file  Food Insecurity:   . Worried About Programme researcher, broadcasting/film/video in the Last Year: Not on file  . Ran Out of Food in the Last Year: Not on file  Transportation Needs:   . Lack of Transportation (Medical): Not on file  . Lack of Transportation (Non-Medical): Not on file  Physical Activity:   . Days of Exercise per Week: Not on file  . Minutes of Exercise per Session: Not on file  Stress:   . Feeling of Stress : Not on file  Social Connections:   . Frequency of Communication with Friends and Family: Not on file  . Frequency of Social Gatherings with Friends and Family: Not on file  . Attends Religious Services: Not on file  . Active Member of Clubs or Organizations: Not on file  . Attends Banker Meetings: Not on file  . Marital Status: Not  on file  Intimate Partner Violence:   . Fear of Current or Ex-Partner: Not on file  . Emotionally Abused: Not on file  . Physically Abused: Not on file  . Sexually Abused: Not on file    Vital Signs: Blood pressure 130/84, pulse 95, temperature 97.9 F (36.6 C), resp. rate 16, height 5\' 7"  (1.702 m), weight 240 lb 6.4 oz (109 kg), SpO2 98 %.  Examination: General Appearance: The patient is well-developed, well-nourished, and in no distress. Skin: Gross inspection of skin unremarkable. Head: normocephalic, no gross deformities. Eyes: no gross deformities noted. ENT: ears appear grossly normal no exudates. Neck: Supple. No thyromegaly. No LAD. Respiratory: Wheezing prominent bilateral bases, no rhonchi or rales noted. Cardiovascular: Normal S1 and S2 without murmur or rub. Extremities: No cyanosis. pulses are equal. Neurologic: Alert and oriented. No involuntary movements.  LABS: Recent Results (from the past 2160 hour(s))  Alpha-1-antitrypsin     Status: None   Collection Time: 09/02/20  4:05 PM  Result Value Ref Range   A-1 Antitrypsin 105 95 - 164 mg/dL    Radiology: DG Chest 2 View  Result Date: 05/21/2020 CLINICAL DATA:  Dyspnea, intermittent chest pain EXAM: CHEST - 2 VIEW COMPARISON:  12/15/2019 FINDINGS: Mild left basilar scarring is unchanged. Lungs are otherwise clear. No pneumothorax or pleural effusion. Cardiac size within normal limits. The pulmonary vascularity is normal. No acute bone abnormality. IMPRESSION: No active cardiopulmonary disease. Electronically Signed   By: 12/17/2019 MD   On: 05/21/2020 15:15    No results found.  No results found.    Assessment and Plan: Patient Active Problem List   Diagnosis Date Noted  . Foreign body in esophagus   . Eosinophilic esophagitis   . Dysphagia 12/15/2019  . High risk medication use 07/18/2019  . Flexor tendon laceration, wrist, open wound, left, subsequent encounter 05/20/2019  . GAD (generalized  anxiety disorder) 05/08/2019  . Panic disorder 05/08/2019  . MDD (major depressive disorder), recurrent episode, mild (HCC) 05/08/2019  . Intermittent explosive disorder in adult 05/08/2019  . Gastroesophageal reflux disease without esophagitis 02/18/2019  . PVC's (premature ventricular contractions) 01/31/2019  . Allergy to meat 10/11/2018  . Chronic left upper quadrant pain 10/11/2018  . 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 Heart palpitations 05/31/2018  . Anxiety, generalized 05/31/2018  . Asthma, chronic 04/05/2015  . Chronic obstructive airway disease with asthma (HCC) 03/17/2015  . Obesity 03/17/2015  . DOE (dyspnea on exertion) 03/17/2015  . Environmental allergies 04/03/2014  . Chest pain 08/02/2013  .  Thyroid nodule 03/21/2013    1. Chronic obstructive bronchitis without exacerbation (HCC) Symptoms fairly stable today, requesting refills on all medications Nebulizer machine given today in office--will have [facility] patient follow-up - albuterol (PROVENTIL) (2.5 MG/3ML) 0.083% nebulizer solution; Inhale 3 mLs (2.5 mg total) into the lungs every 6 (six) hours as needed for wheezing or shortness of breath.  Dispense: 75 mL; Refill: 4 - albuterol (VENTOLIN HFA) 108 (90 Base) MCG/ACT inhaler; INHALE 2 PUFFS BY MOUTH EVERY 4-6 HOURS  Dispense: 25.5 g; Refill: 0 - ATROVENT HFA 17 MCG/ACT inhaler; Inhale 2 puffs into the lungs every 6 (six) hours as needed for wheezing.  Dispense: 38.7 g; Refill: 0 - COMBIVENT RESPIMAT 20-100 MCG/ACT AERS respimat; Use as directed  Dispense: 8 g; Refill: 4 - fluticasone (FLOVENT HFA) 220 MCG/ACT inhaler; Inhale 2 puffs into the lungs 2 (two) times daily.  Dispense: 1 each; Refill: 3 - For home use only DME Nebulizer machine  2. Dependence on nocturnal oxygen therapy Continue with nocturnal oxygen therapy at this time  3. Allergy to alpha-gal - Alpha-1-antitrypsin  4. Moderate persistent extrinsic asthma without complication Symptoms fairly  stable at this time, continue with current therapy, requesting refills at this time - cetirizine HCl (ZYRTEC) 5 MG/5ML SOLN; Take 15 mLs (15 mg total) by mouth daily.  Dispense: 300 mL; Refill: 0 - fluticasone (FLONASE) 50 MCG/ACT nasal spray; Place 1 spray into both nostrils daily.  Dispense: 11.1 mL; Refill: 2  5. SOB (shortness of breath) FEV1 2.3L, 60% predicted, slight decrease in function since PFT--will continue to monitor - Spirometry with Graph  General Counseling: I have discussed the findings of the evaluation and examination with Harvie Heck.  I have also discussed any further diagnostic evaluation thatmay be needed or ordered today. Traves verbalizes understanding of the findings of todays visit. We also reviewed his medications today and discussed drug interactions and side effects including but not limited excessive drowsiness and altered mental states. We also discussed that there is always a risk not just to him but also people around him. he has been encouraged to call the office with any questions or concerns that should arise related to todays visit.  Orders Placed This Encounter  Procedures  . For home use only DME Nebulizer machine    Order Specific Question:   Patient needs a nebulizer to treat with the following condition    Answer:   COPD (chronic obstructive pulmonary disease) (HCC) [277412]    Order Specific Question:   Length of Need    Answer:   Lifetime  . Alpha-1-antitrypsin  . Spirometry with Graph    Order Specific Question:   Where should this test be performed?    Answer:   [facility]    Order Specific Question:   Basic spirometry    Answer:   Yes    Order Specific Question:   Spirometry pre & post bronchodilator    Answer:   No     Time spent: 30  I have personally obtained a history, examined the patient, evaluated laboratory and imaging results, formulated the assessment and plan and placed orders. This patient was seen by Brent General AGNP-C  in Collaboration with Dr. Freda Munro as a part of collaborative care agreement.    Yevonne Pax, MD [facility] Pulmonary and Critical Care Sleep medicine

## 2020-09-03 LAB — ALPHA-1-ANTITRYPSIN: A-1 Antitrypsin: 105 mg/dL (ref 95–164)

## 2020-09-03 NOTE — Progress Notes (Signed)
Reviewed labs, will discuss at next follow-up.

## 2020-09-04 ENCOUNTER — Encounter: Payer: Self-pay | Admitting: Internal Medicine

## 2020-09-04 NOTE — Patient Instructions (Signed)
Chronic Obstructive Pulmonary Disease Chronic obstructive pulmonary disease (COPD) is a long-term (chronic) lung problem. When you have COPD, it is hard for air to get in and out of your lungs. Usually the condition gets worse over time, and your lungs will never return to normal. There are things you can do to keep yourself as healthy as possible.  Your doctor may treat your condition with: ? Medicines. ? Oxygen. ? Lung surgery.  Your doctor may also recommend: ? Rehabilitation. This includes steps to make your body work better. It may involve a team of specialists. ? Quitting smoking, if you smoke. ? Exercise and changes to your diet. ? Comfort measures (palliative care). Follow these instructions at home: Medicines  Take over-the-counter and prescription medicines only as told by your doctor.  Talk to your doctor before taking any cough or allergy medicines. You may need to avoid medicines that cause your lungs to be dry. Lifestyle  If you smoke, stop. Smoking makes the problem worse. If you need help quitting, ask your doctor.  Avoid being around things that make your breathing worse. This may include smoke, chemicals, and fumes.  Stay active, but remember to rest as well.  Learn and use tips on how to relax.  Make sure you get enough sleep. Most adults need at least 7 hours of sleep every night.  Eat healthy foods. Eat smaller meals more often. Rest before meals. Controlled breathing Learn and use tips on how to control your breathing as told by your doctor. Try:  Breathing in (inhaling) through your nose for 1 second. Then, pucker your lips and breath out (exhale) through your lips for 2 seconds.  Putting one hand on your belly (abdomen). Breathe in slowly through your nose for 1 second. Your hand on your belly should move out. Pucker your lips and breathe out slowly through your lips. Your hand on your belly should move in as you breathe out.  Controlled coughing Learn  and use controlled coughing to clear mucus from your lungs. Follow these steps: 1. Lean your head a little forward. 2. Breathe in deeply. 3. Try to hold your breath for 3 seconds. 4. Keep your mouth slightly open while coughing 2 times. 5. Spit any mucus out into a tissue. 6. Rest and do the steps again 1 or 2 times as needed. General instructions  Make sure you get all the shots (vaccines) that your doctor recommends. Ask your doctor about a flu shot and a pneumonia shot.  Use oxygen therapy and pulmonary rehabilitation if told by your doctor. If you need home oxygen therapy, ask your doctor if you should buy a tool to measure your oxygen level (oximeter).  Make a COPD action plan with your doctor. This helps you to know what to do if you feel worse than usual.  Manage any other conditions you have as told by your doctor.  Avoid going outside when it is very hot, cold, or humid.  Avoid people who have a sickness you can catch (contagious).  Keep all follow-up visits as told by your doctor. This is important. Contact a doctor if:  You cough up more mucus than usual.  There is a change in the color or thickness of the mucus.  It is harder to breathe than usual.  Your breathing is faster than usual.  You have trouble sleeping.  You need to use your medicines more often than usual.  You have trouble doing your normal activities such as getting dressed   or walking around the house. Get help right away if:  You have shortness of breath while resting.  You have shortness of breath that stops you from: ? Being able to talk. ? Doing normal activities.  Your chest hurts for longer than 5 minutes.  Your skin color is more blue than usual.  Your pulse oximeter shows that you have low oxygen for longer than 5 minutes.  You have a fever.  You feel too tired to breathe normally. Summary  Chronic obstructive pulmonary disease (COPD) is a long-term lung problem.  The way your  lungs work will never return to normal. Usually the condition gets worse over time. There are things you can do to keep yourself as healthy as possible.  Take over-the-counter and prescription medicines only as told by your doctor.  If you smoke, stop. Smoking makes the problem worse. This information is not intended to replace advice given to you by your health care provider. Make sure you discuss any questions you have with your health care provider. Document Revised: 09/29/2017 Document Reviewed: 11/21/2016 Elsevier Patient Education  2020 [employer]  

## 2020-09-24 IMAGING — CR RIGHT HAND - 2 VIEW
1 series · 6 of 6 positions shown · non-contrast
Comparison: Radiographs dated 04/07/2007

CLINICAL DATA: Multiple lacerations.

EXAM:
RIGHT HAND - 2 VIEW

[Series 1: oblique · 0.17mm/px · 6 of 6 slices shown]
[im 1/6]
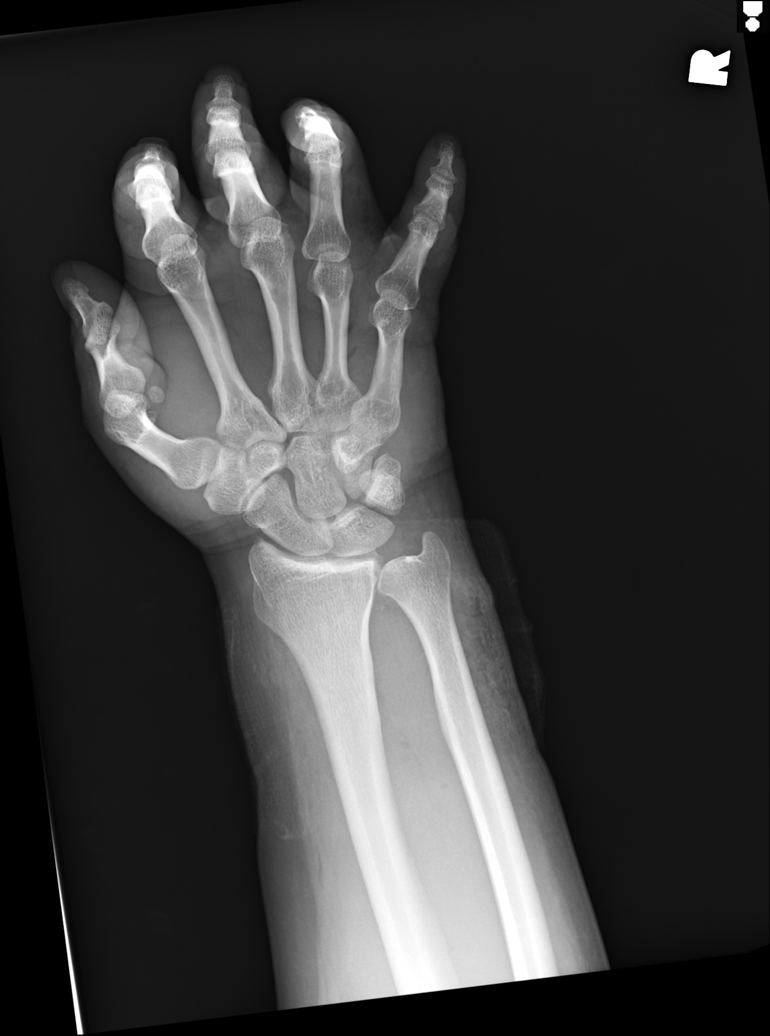
[im 2/6]
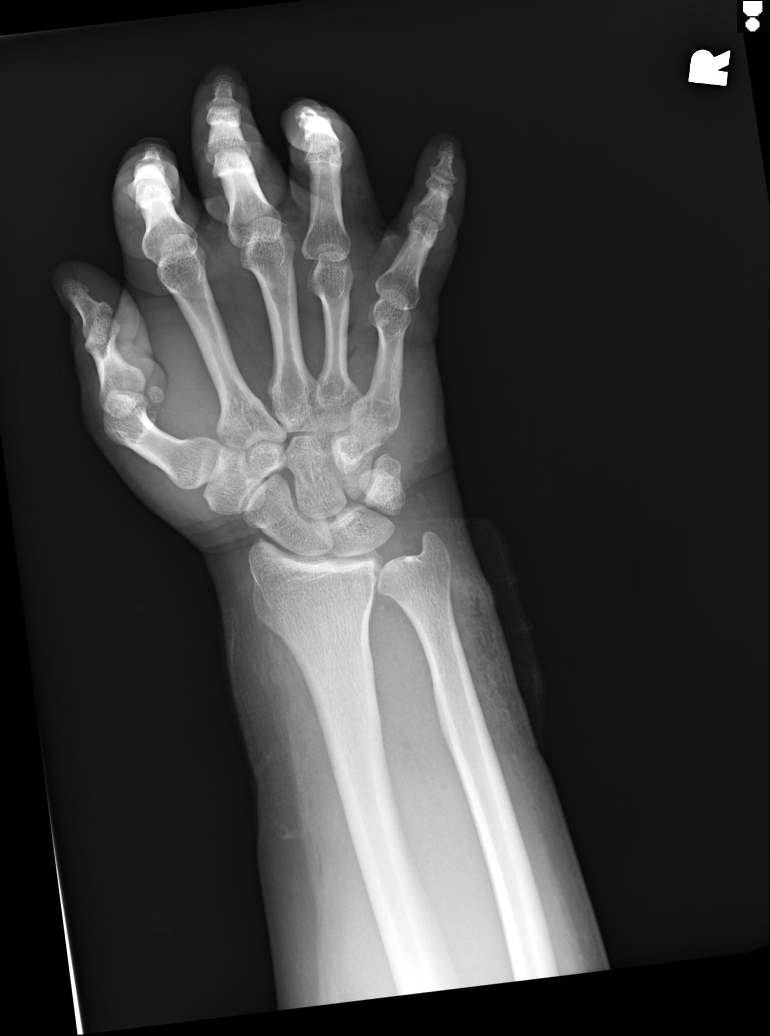
[im 3/6]
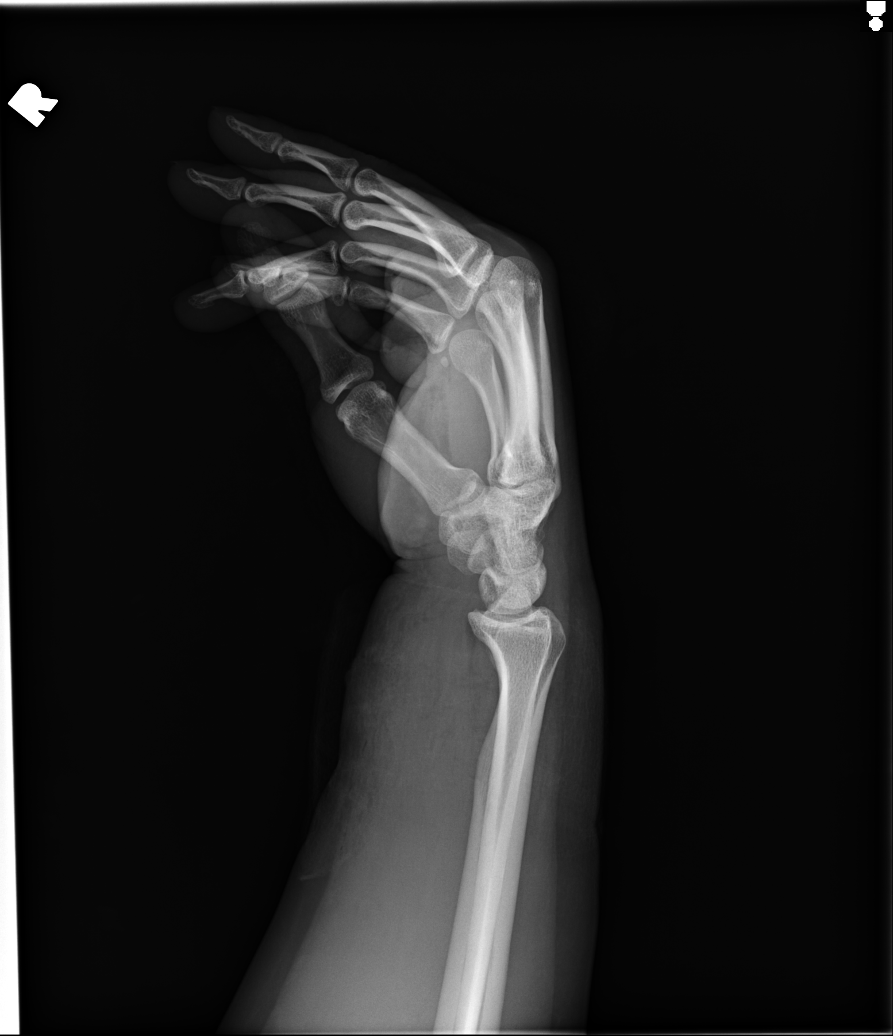
[im 4/6]
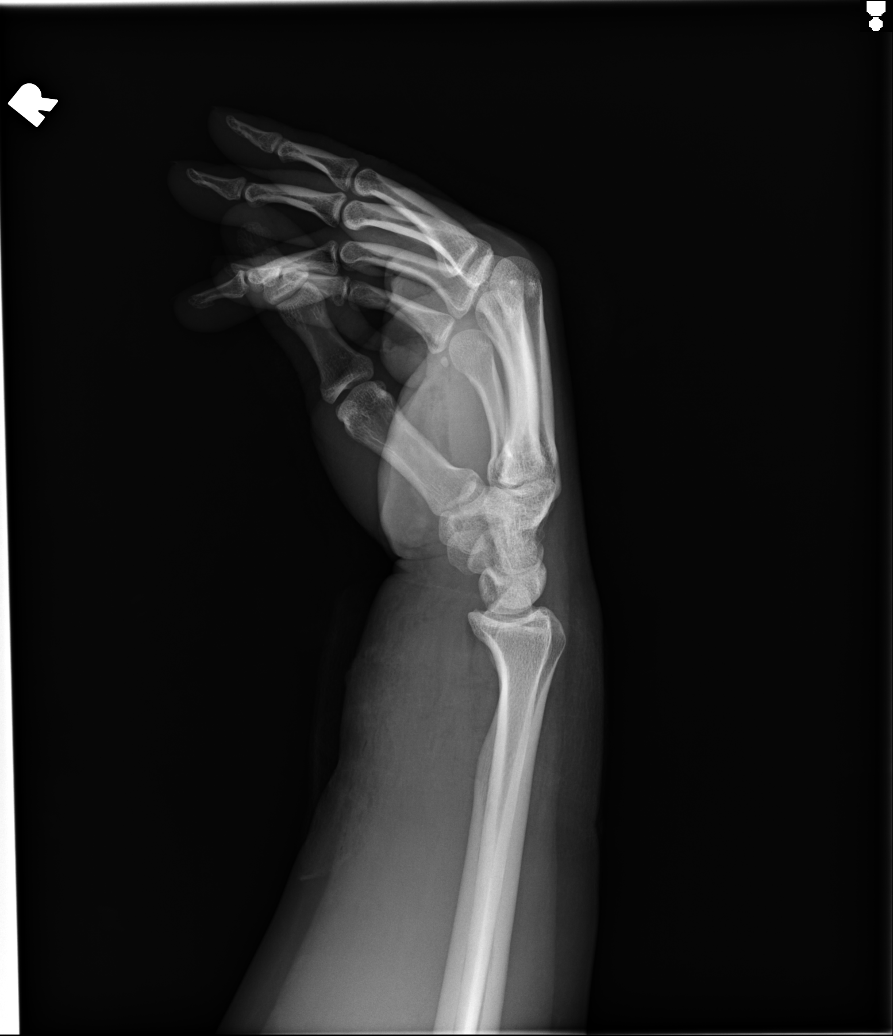
[im 5/6]
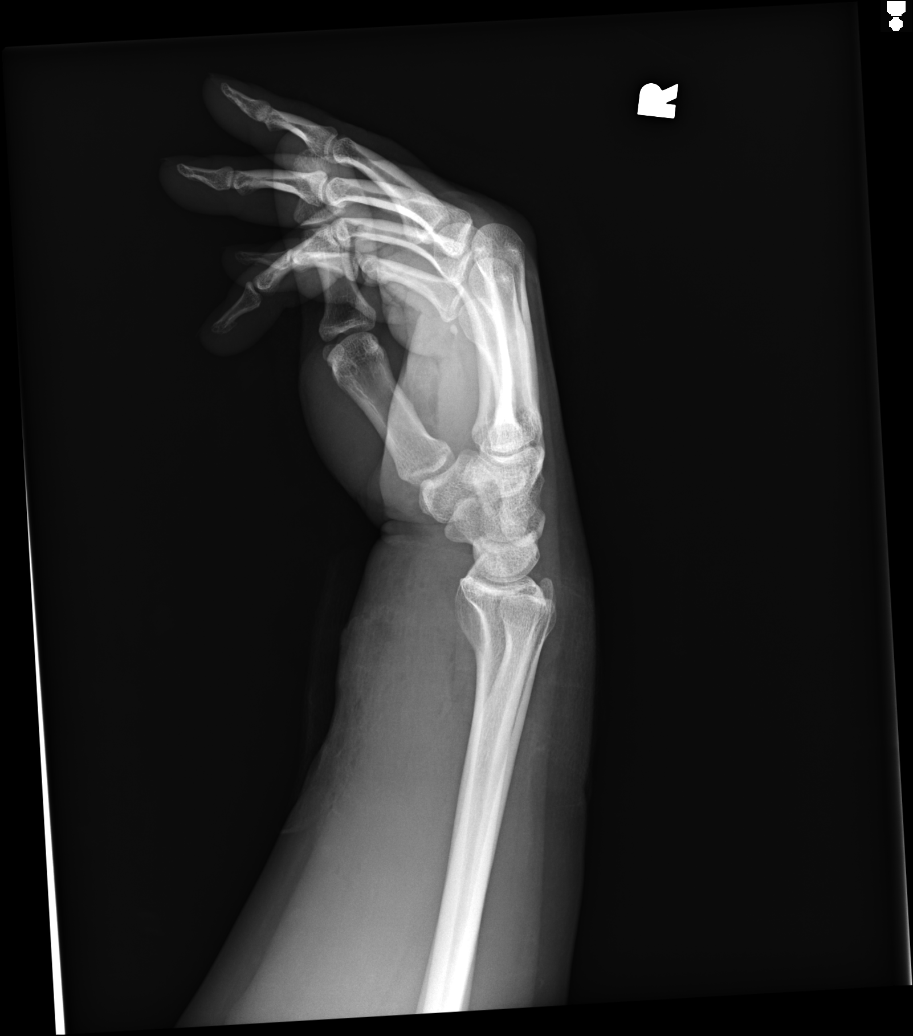
[im 6/6]
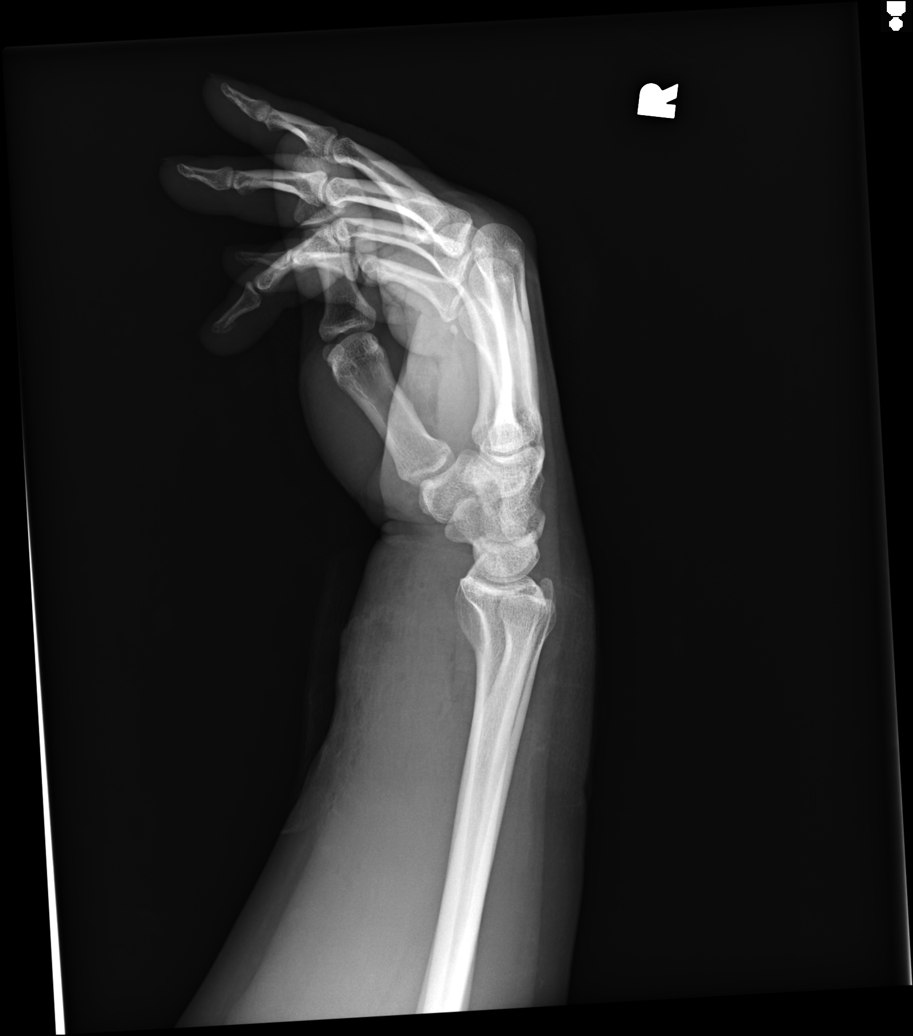

[6 of 6 positions shown; findings below may reference images not displayed]

FINDINGS: There is no fracture or dislocation. Soft tissue lacerations. No
visible radiodense foreign body in the soft tissues.
IMPRESSION: Lacerations.  Normal bones.  No foreign bodies.

## 2020-10-13 ENCOUNTER — Ambulatory Visit: Payer: Medicare Other | Admitting: Internal Medicine

## 2020-10-27 ENCOUNTER — Ambulatory Visit: Payer: Medicare Other | Admitting: Internal Medicine

## 2020-10-29 ENCOUNTER — Ambulatory Visit (INDEPENDENT_AMBULATORY_CARE_PROVIDER_SITE_OTHER): Payer: Medicare Other | Admitting: Internal Medicine

## 2020-10-29 ENCOUNTER — Encounter: Payer: Self-pay | Admitting: Internal Medicine

## 2020-10-29 VITALS — BP 110/84 | HR 91 | Temp 97.3°F | Resp 16 | Ht 67.0 in | Wt 236.6 lb

## 2020-10-29 DIAGNOSIS — K200 Eosinophilic esophagitis: Secondary | ICD-10-CM | POA: Diagnosis not present

## 2020-10-29 DIAGNOSIS — J301 Allergic rhinitis due to pollen: Secondary | ICD-10-CM

## 2020-10-29 DIAGNOSIS — I471 Supraventricular tachycardia, unspecified: Secondary | ICD-10-CM

## 2020-10-29 DIAGNOSIS — J4540 Moderate persistent asthma, uncomplicated: Secondary | ICD-10-CM | POA: Diagnosis not present

## 2020-10-29 NOTE — Progress Notes (Signed)
[facility] [address] [place], [place] [address]  Pulmonary Sleep Medicine   Office Visit Note  Patient Name: Steven [name] DOB: Nov 28, 1980 MRN [id number]  Date of Service: 10/29/2020  Complaints/HPI: Patient is here for routine pulmonary follow-up Since our last visit, breathing has remained stable, no recent exacerbations or hospitalizations Has been seen by cardiology--scheduled for CT angiogram of coronary arteries for further evaluation of SVT, PVC's and atypical chest pain History of EOE, has had allergy testing--unable to tolerate allergy injections Continues to have issues with swallowing, followed by GI, likely will need esophageal stretching in the New Year as symptoms have significantly progressed Continues to use Breo as well as Combivent daily, uses albuterol as well as nebulizer as needed  ROS  General: (-) fever, (-) chills, (-) night sweats, (-) weakness Skin: (-) rashes, (-) itching,. Eyes: (-) visual changes, (-) redness, (-) itching. Nose and Sinuses: (-) nasal stuffiness or itchiness, (-) postnasal drip, (-) nosebleeds, (-) sinus trouble. Mouth and Throat: (-) sore throat, (-) hoarseness. Neck: (-) swollen glands, (-) enlarged thyroid, (-) neck pain. Respiratory: - cough, (-) bloody sputum, + shortness of breath, + wheezing. Cardiovascular: - ankle swelling, (-) chest pain. Lymphatic: (-) lymph node enlargement. Neurologic: (-) numbness, (-) tingling. Psychiatric: (-) anxiety, (-) depression   Current Medication: Outpatient Encounter Medications as of 10/29/2020  Medication Sig  . acetaminophen (TYLENOL) 500 MG tablet Take 1,000 mg by mouth every 6 (six) hours as needed for moderate pain.  Marland Kitchen albuterol (PROVENTIL) (2.5 MG/3ML) 0.083% nebulizer solution Inhale 3 mLs (2.5 mg total) into the lungs every 6 (six) hours as needed for wheezing or shortness of breath.  Marland Kitchen albuterol (VENTOLIN HFA) 108 (90 Base) MCG/ACT inhaler INHALE 2 PUFFS BY  MOUTH EVERY 4-6 HOURS  . ATROVENT HFA 17 MCG/ACT inhaler Inhale 2 puffs into the lungs every 6 (six) hours as needed for wheezing.  Marland Kitchen BREO ELLIPTA 100-25 MCG/INH AEPB Inhale 1 puff into the lungs daily.  . cetirizine HCl (ZYRTEC) 5 MG/5ML SOLN Take 15 mLs (15 mg total) by mouth daily.  . clonazePAM (KLONOPIN) 1 MG tablet Take 1 mg by mouth 3 (three) times daily as needed.  . COMBIVENT RESPIMAT 20-100 MCG/ACT AERS respimat Use as directed  . EPIPEN 2-PAK 0.3 MG/0.3ML SOAJ injection 0.3 mg.   . escitalopram (LEXAPRO) 20 MG tablet TAKE 1 TABLET BY MOUTH ONCE DAILY.  . fluticasone (FLONASE) 50 MCG/ACT nasal spray Place 1 spray into both nostrils daily.  Marland Kitchen KAPSPARGO SPRINKLE 25 MG CS24 Take 1 capsule by mouth daily.  . OXYGEN Inhale into the lungs. 2LITERS AT NIGHT AND WHEN SICK  . fluticasone (FLOVENT HFA) 220 MCG/ACT inhaler Inhale 2 puffs into the lungs 2 (two) times daily.  Marland Kitchen omeprazole (PRILOSEC) 40 MG capsule Take 1 capsule (40 mg total) by mouth in the morning and at bedtime.   No facility-administered encounter medications on file as of 10/29/2020.    Surgical History: Past Surgical History:  Procedure Laterality Date  . ESOPHAGOGASTRODUODENOSCOPY N/A 02/18/2019   Procedure: ESOPHAGOGASTRODUODENOSCOPY (EGD);  Surgeon: Toledo, Boykin Nearing, MD;  Location: [facility] ENDOSCOPY;  Service: Gastroenterology;  Laterality: N/A;  . ESOPHAGOGASTRODUODENOSCOPY (EGD) WITH PROPOFOL N/A 09/05/2017   Procedure: ESOPHAGOGASTRODUODENOSCOPY (EGD) WITH PROPOFOL;  Surgeon: Toledo, Boykin Nearing, MD;  Location: [facility] ENDOSCOPY;  Service: Gastroenterology;  Laterality: N/A;  . ESOPHAGOGASTRODUODENOSCOPY (EGD) WITH PROPOFOL N/A 01/22/2020   Procedure: ESOPHAGOGASTRODUODENOSCOPY (EGD) WITH PROPOFOL;  Surgeon: Toledo, Boykin Nearing, MD;  Location: [facility] ENDOSCOPY;  Service: Gastroenterology;  Laterality: N/A;  . FOREIGN BODY REMOVAL    . FOREIGN BODY REMOVAL N/A 12/16/2019   Procedure: FOREIGN BODY REMOVAL;  Surgeon: Toney Reil, MD;  Location: [facility] ENDOSCOPY;  Service: Gastroenterology;  Laterality: N/A;  . none    . TUBES IN EARS      Medical History: Past Medical History:  Diagnosis Date  . Anxiety   . Arthritis    knee  . Asthma   . Chronic mental illness   . COPD (chronic obstructive pulmonary disease) (HCC)   . Depression   . Eosinophilic esophagitis   . GERD (gastroesophageal reflux disease)   . Heart palpitations   . Hiatal hernia   . Panic attack   . Pulmonary fibrosis (HCC)     Family History: Family History  Problem Relation Age of Onset  . COPD Mother   . Asthma Mother   . Mental illness Mother   . COPD Father   . Pulmonary fibrosis Father   . Lung cancer Father     Social History: Social History   Socioeconomic History  . Marital status: Married    Spouse name: heather  . Number of children: 4  . Years of education: Not on file  . Highest education level: Associate degree: occupational, Scientist, product/process development, or vocational program  Occupational History  . Not on file  Tobacco Use  . Smoking status: Former Smoker    Packs/day: 0.50    Years: 2.00    Pack years: 1.00    Types: Cigarettes    Quit date: 10/31/2000    Years since quitting: 20.0  . Smokeless tobacco: Current User    Types: Chew  . Tobacco comment: 2003  Vaping Use  . Vaping Use: Never used  Substance and Sexual Activity  . Alcohol use: No    Alcohol/week: 0.0 standard drinks  . Drug use: No  . Sexual activity: Yes  Other Topics Concern  . Not on file  Social History Narrative  . Not on file   Social Determinants of Health   Financial Resource Strain: Not on file  Food Insecurity: Not on file  Transportation Needs: Not on file  Physical Activity: Not on file  Stress: Not on file  Social Connections: Not on file  Intimate Partner Violence: Not on file    Vital Signs: Blood pressure 110/84, pulse 91, temperature (!) 97.3 F (36.3 C), resp. rate 16, height 5\' 7"  (1.702 m), weight 236 lb 9.6 oz  (107.3 kg), SpO2 98 %.  Examination: General Appearance: The patient is well-developed, well-nourished, and in no distress. Skin: Gross inspection of skin unremarkable. Head: normocephalic, no gross deformities. Eyes: no gross deformities noted. ENT: ears appear grossly normal no exudates. Neck: Supple. No thyromegaly. No LAD. Respiratory: Wheezing prominent at bilateral bases, no rhonchi or rales noted. Cardiovascular: Normal S1 and S2 without murmur or rub. Extremities: No cyanosis. pulses are equal. Neurologic: Alert and oriented. No involuntary movements.  LABS: Recent Results (from the past 2160 hour(s))  Alpha-1-antitrypsin     Status: None   Collection Time: 09/02/20  4:05 PM  Result Value Ref Range   A-1 Antitrypsin 105 95 - 164 mg/dL    Radiology: DG Chest 2 View  Result Date: 05/21/2020 CLINICAL DATA:  Dyspnea, intermittent chest pain EXAM: CHEST - 2 VIEW COMPARISON:  12/15/2019 FINDINGS: Mild left basilar scarring is unchanged. Lungs are otherwise clear. No pneumothorax or pleural effusion. Cardiac size within normal limits. The pulmonary vascularity is normal. No acute bone abnormality.  IMPRESSION: No active cardiopulmonary disease. Electronically Signed   By: Helyn Numbers MD   On: 05/21/2020 15:15    No results found.  No results found.    Assessment and Plan: Patient Active Problem List   Diagnosis Date Noted  . Foreign body in esophagus   . Eosinophilic esophagitis   . Dysphagia 12/15/2019  . High risk medication use 07/18/2019  . Flexor tendon laceration, wrist, open wound, left, subsequent encounter 05/20/2019  . GAD (generalized anxiety disorder) 05/08/2019  . Panic disorder 05/08/2019  . MDD (major depressive disorder), recurrent episode, mild (HCC) 05/08/2019  . Intermittent explosive disorder in adult 05/08/2019  . Gastroesophageal reflux disease without esophagitis 02/18/2019  . PVC's (premature ventricular contractions) 01/31/2019  . Allergy to  meat 10/11/2018  . Chronic left upper quadrant pain 10/11/2018  . 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 Heart palpitations 05/31/2018  . Anxiety, generalized 05/31/2018  . Asthma, chronic 04/05/2015  . Chronic obstructive airway disease with asthma (HCC) 03/17/2015  . Obesity 03/17/2015  . DOE (dyspnea on exertion) 03/17/2015  . Environmental allergies 04/03/2014  . Chest pain 08/02/2013  . Thyroid nodule 03/21/2013    1. Moderate persistent extrinsic asthma without complication Symptoms appear stable today, continue with current therapy and routine montioring  2. Seasonal allergic rhinitis due to pollen Symptoms remain stable at this time, continue to monitor  3. Eosinophilic esophagitis Followed by GI--may require further intervention as he continues to have issues with swallowing especially medications  4. SVT (supraventricular tachycardia) (HCC) Follow-up with cardiac MRI and cardiology follow-up as recommended  General Counseling: I have discussed the findings of the evaluation and examination with Harvie Heck.  I have also discussed any further diagnostic evaluation thatmay be needed or ordered today. Sundance verbalizes understanding of the findings of todays visit. We also reviewed his medications today and discussed drug interactions and side effects including but not limited excessive drowsiness and altered mental states. We also discussed that there is always a risk not just to him but also people around him. he has been encouraged to call the office with any questions or concerns that should arise related to todays visit.     Time spent: 30  I have personally obtained a history, examined the patient, evaluated laboratory and imaging results, formulated the assessment and plan and placed orders. This patient was seen by Brent General AGNP-C in Collaboration with Dr. Freda Munro as a part of collaborative care agreement.    Yevonne Pax, MD [facility] Pulmonary and Critical Care Sleep  medicine

## 2020-11-02 ENCOUNTER — Encounter: Payer: Self-pay | Admitting: Internal Medicine

## 2020-11-05 ENCOUNTER — Telehealth: Payer: Self-pay

## 2020-11-05 MED ORDER — KAPSPARGO SPRINKLE 25 MG PO CS24: 1.0000 | EXTENDED_RELEASE_CAPSULE | Freq: Every day | ORAL | 3 refills | Status: AC

## 2020-11-05 NOTE — Telephone Encounter (Signed)
PA approved for [facility] CAP 25 mg through 10/30/2021.  Pt was informed

## 2020-12-15 ENCOUNTER — Other Ambulatory Visit: Payer: Self-pay

## 2020-12-15 DIAGNOSIS — J4540 Moderate persistent asthma, uncomplicated: Secondary | ICD-10-CM

## 2020-12-15 MED ORDER — CETIRIZINE HCL 5 MG/5ML PO SOLN: 15.0000 mg | Freq: Every day | ORAL | 0 refills | Status: DC

## 2020-12-16 ENCOUNTER — Other Ambulatory Visit (HOSPITAL_COMMUNITY): Payer: Self-pay | Admitting: Cardiology

## 2020-12-16 DIAGNOSIS — R079 Chest pain, unspecified: Secondary | ICD-10-CM

## 2020-12-16 DIAGNOSIS — I493 Ventricular premature depolarization: Secondary | ICD-10-CM

## 2020-12-30 ENCOUNTER — Telehealth (HOSPITAL_COMMUNITY): Payer: Self-pay | Admitting: *Deleted

## 2020-12-30 NOTE — Telephone Encounter (Signed)
Attempted to call patient regarding upcoming cardiac CT appointment. Unable to leave a message.  Lupita Rosales RN Navigator Cardiac Imaging [facility] [phone] Office [phone] Cell  

## 2020-12-30 NOTE — Telephone Encounter (Signed)
Pt returning call regarding upcoming cardiac imaging study; pt verbalizes understanding of appt date/time, parking situation and where to check in, pre-test NPO status and medications ordered, and verified current allergies; name and call back number provided for further questions should they arise  Rachell Druckenmiller RN Navigator Cardiac Imaging [facility] [phone] office [phone] cell  

## 2020-12-31 ENCOUNTER — Ambulatory Visit
Admission: RE | Admit: 2020-12-31 | Discharge: 2020-12-31 | Disposition: A | Discharge status: Home / Self Care | Payer: Medicare Other | Source: Ambulatory Visit | Attending: Cardiology | Admitting: Cardiology

## 2020-12-31 ENCOUNTER — Other Ambulatory Visit: Payer: Self-pay

## 2020-12-31 DIAGNOSIS — I493 Ventricular premature depolarization: Secondary | ICD-10-CM | POA: Insufficient documentation

## 2020-12-31 DIAGNOSIS — R9439 Abnormal result of other cardiovascular function study: Secondary | ICD-10-CM | POA: Diagnosis not present

## 2020-12-31 DIAGNOSIS — R079 Chest pain, unspecified: Secondary | ICD-10-CM | POA: Insufficient documentation

## 2020-12-31 LAB — POCT I-STAT CREATININE: Creatinine, Ser: 0.9 mg/dL (ref 0.61–1.24)

## 2020-12-31 MED ORDER — METOPROLOL TARTRATE 5 MG/5ML IV SOLN: 10.0000 mg | Freq: Once | INTRAVENOUS | Status: DC

## 2020-12-31 MED ORDER — METOPROLOL TARTRATE 5 MG/5ML IV SOLN
5.0000 mg | Freq: Once | INTRAVENOUS | Status: AC
  Administered 2020-12-31: 5 mg via INTRAVENOUS

## 2020-12-31 MED ORDER — METOPROLOL TARTRATE 5 MG/5ML IV SOLN
10.0000 mg | Freq: Once | INTRAVENOUS | Status: AC
  Administered 2020-12-31: 10 mg via INTRAVENOUS

## 2020-12-31 MED ORDER — NITROGLYCERIN 0.4 MG SL SUBL
0.8000 mg | SUBLINGUAL_TABLET | Freq: Once | SUBLINGUAL | Status: AC
  Administered 2020-12-31: 0.8 mg via SUBLINGUAL

## 2020-12-31 MED ORDER — IOHEXOL 350 MG/ML SOLN
100.0000 mL | Freq: Once | INTRAVENOUS | Status: AC | PRN
  Administered 2020-12-31: 100 mL via INTRAVENOUS

## 2020-12-31 MED ORDER — DILTIAZEM HCL 25 MG/5ML IV SOLN: 10.0000 mg | Freq: Once | INTRAVENOUS | Status: DC

## 2020-12-31 MED ORDER — DILTIAZEM HCL 25 MG/5ML IV SOLN
5.0000 mg | Freq: Once | INTRAVENOUS | Status: AC
  Administered 2020-12-31: 5 mg via INTRAVENOUS

## 2020-12-31 NOTE — Progress Notes (Signed)
Patient tolerated procedure well. Ambulate w/o difficulty. Sitting in chair drinking water provided. Encouraged to drink extra water today and reasoning explained. Verbalized understanding. All questions answered. ABC intact. No further needs. Discharge from procedure area w/o issues.  

## 2021-02-02 ENCOUNTER — Ambulatory Visit: Payer: Medicare Other | Admitting: Internal Medicine

## 2021-02-08 ENCOUNTER — Other Ambulatory Visit: Payer: Medicare Other | Attending: Internal Medicine

## 2021-02-08 ENCOUNTER — Other Ambulatory Visit: Payer: Self-pay

## 2021-02-08 ENCOUNTER — Telehealth: Payer: Self-pay

## 2021-02-08 DIAGNOSIS — J449 Chronic obstructive pulmonary disease, unspecified: Secondary | ICD-10-CM

## 2021-02-08 MED ORDER — COMBIVENT RESPIMAT 20-100 MCG/ACT IN AERS: INHALATION_SPRAY | RESPIRATORY_TRACT | 2 refills | Status: AC

## 2021-02-08 NOTE — Telephone Encounter (Signed)
Pt called that he tested positive for covid and like to know if he can get monoclonal antibody advised him to call Health department and also call PCP and advised him if symptoms getting worse need to go to ED

## 2021-02-09 ENCOUNTER — Other Ambulatory Visit: Payer: Self-pay | Admitting: Adult Health

## 2021-02-09 ENCOUNTER — Encounter: Payer: Self-pay | Admitting: Adult Health

## 2021-02-09 DIAGNOSIS — U071 COVID-19: Secondary | ICD-10-CM

## 2021-02-09 NOTE — Progress Notes (Signed)
I connected by phone with Steven [name] on 02/09/2021 at 2:52 PM to discuss the potential use of a new treatment for mild to moderate COVID-19 viral infection in non-hospitalized patients.  This patient is a 40 y.o. male that meets the FDA criteria for Emergency Use Authorization of COVID monoclonal antibody bebtelovimab.  Has a (+) direct SARS-CoV-2 viral test result  Has mild or moderate COVID-19   Is NOT hospitalized due to COVID-19  Is within 10 days of symptom onset  Has at least one of the high risk factor(s) for progression to severe COVID-19 and/or hospitalization as defined in EUA.  Specific high risk criteria : Chronic Lung Disease   Cost reviewed  Sx onset 02/07/2021   I have spoken and communicated the following to the patient or parent/caregiver regarding COVID monoclonal antibody treatment:  1. FDA has authorized the emergency use for the treatment of mild to moderate COVID-19 in adults and pediatric patients with positive results of direct SARS-CoV-2 viral testing who are 73 years of age and older weighing at least 40 kg, and who are at high risk for progressing to severe COVID-19 and/or hospitalization.  2. The significant known and potential risks and benefits of COVID monoclonal antibody, and the extent to which such potential risks and benefits are unknown.  3. Information on available alternative treatments and the risks and benefits of those alternatives, including clinical trials.  4. Patients treated with COVID monoclonal antibody should continue to self-isolate and use infection control measures (e.g., wear mask, isolate, social distance, avoid sharing personal items, clean and disinfect "high touch" surfaces, and frequent handwashing) according to CDC guidelines.   5. The patient or parent/caregiver has the option to accept or refuse COVID monoclonal antibody treatment.  After reviewing this information with the patient, the patient has agreed to receive  one of the available covid 19 monoclonal antibodies and will be provided an appropriate fact sheet prior to infusion. Noreene Filbert, NP 02/09/2021 2:52 PM

## 2021-02-10 ENCOUNTER — Ambulatory Visit: Admission: RE | Admit: 2021-02-10 | Payer: Medicare Other | Source: Home / Self Care | Admitting: Internal Medicine

## 2021-02-10 ENCOUNTER — Ambulatory Visit (HOSPITAL_COMMUNITY)
Admission: RE | Admit: 2021-02-10 | Discharge: 2021-02-10 | Disposition: A | Discharge status: Home / Self Care | Payer: Medicare Other | Source: Ambulatory Visit | Attending: Pulmonary Disease | Admitting: Pulmonary Disease

## 2021-02-10 ENCOUNTER — Encounter: Admission: RE | Payer: Self-pay | Source: Home / Self Care

## 2021-02-10 DIAGNOSIS — U071 COVID-19: Secondary | ICD-10-CM | POA: Insufficient documentation

## 2021-02-10 SURGERY — ESOPHAGOGASTRODUODENOSCOPY (EGD) WITH PROPOFOL
Anesthesia: General

## 2021-02-10 MED ORDER — DIPHENHYDRAMINE HCL 50 MG/ML IJ SOLN: 50.0000 mg | Freq: Once | INTRAMUSCULAR | Status: DC | PRN

## 2021-02-10 MED ORDER — EPINEPHRINE 0.3 MG/0.3ML IJ SOAJ: 0.3000 mg | Freq: Once | INTRAMUSCULAR | Status: DC | PRN

## 2021-02-10 MED ORDER — BEBTELOVIMAB 175 MG/2 ML IV (EUA)
175.0000 mg | Freq: Once | INTRAMUSCULAR | Status: AC
  Administered 2021-02-10: 175 mg via INTRAVENOUS

## 2021-02-10 MED ORDER — METHYLPREDNISOLONE SODIUM SUCC 125 MG IJ SOLR: 125.0000 mg | Freq: Once | INTRAMUSCULAR | Status: DC | PRN

## 2021-02-10 MED ORDER — ALBUTEROL SULFATE HFA 108 (90 BASE) MCG/ACT IN AERS: 2.0000 | INHALATION_SPRAY | Freq: Once | RESPIRATORY_TRACT | Status: DC | PRN

## 2021-02-10 MED ORDER — SODIUM CHLORIDE 0.9 % IV SOLN: INTRAVENOUS | Status: DC | PRN

## 2021-02-10 MED ORDER — FAMOTIDINE IN NACL 20-0.9 MG/50ML-% IV SOLN: 20.0000 mg | Freq: Once | INTRAVENOUS | Status: DC | PRN

## 2021-02-10 NOTE — Progress Notes (Signed)
Patient reviewed Fact Sheet for Patients, Parents, and Caregivers for Emergency Use Authorization (EUA) of bebtelovimab for the Treatment of Coronavirus. Patient also reviewed and is agreeable to the estimated cost of treatment. Patient is agreeable to proceed.   

## 2021-02-10 NOTE — Discharge Instructions (Signed)
10 Things You Can Do to Manage Your COVID-19 Symptoms at Home If you have possible or confirmed COVID-19: 1. Stay home except to get medical care. 2. Monitor your symptoms carefully. If your symptoms get worse, call your healthcare provider immediately. 3. Get rest and stay hydrated. 4. If you have a medical appointment, call the healthcare provider ahead of time and tell them that you have or may have COVID-19. 5. For medical emergencies, call 911 and notify the dispatch personnel that you have or may have COVID-19. 6. Cover your cough and sneezes with a tissue or use the inside of your elbow. 7. Wash your hands often with soap and water for at least 20 seconds or clean your hands with an alcohol-based hand sanitizer that contains at least 60% alcohol. 8. As much as possible, stay in a specific room and away from other people in your home. Also, you should use a separate bathroom, if available. If you need to be around other people in or outside of the home, wear a mask. 9. Avoid sharing personal items with other people in your household, like dishes, towels, and bedding. 10. Clean all surfaces that are touched often, like counters, tabletops, and doorknobs. Use household cleaning sprays or wipes according to the label instructions. cdc.gov/coronavirus 05/15/2020 This information is not intended to replace advice given to you by your health care provider. Make sure you discuss any questions you have with your health care provider. Document Revised: 08/31/2020 Document Reviewed: 08/31/2020 Elsevier Patient Education  2021 Elsevier Inc.  What types of side effects do monoclonal antibody drugs cause?  Common side effects  In general, the more common side effects caused by monoclonal antibody drugs include: . Allergic reactions, such as hives or itching . Flu-like signs and symptoms, including chills, fatigue, fever, and muscle aches and pains . Nausea, vomiting . Diarrhea . Skin  rashes . Low blood pressure   The CDC is recommending patients who receive monoclonal antibody treatments wait at least 90 days before being vaccinated.  Currently, there are no data on the safety and efficacy of mRNA COVID-19 vaccines in persons who received monoclonal antibodies or convalescent plasma as part of COVID-19 treatment. Based on the estimated half-life of such therapies as well as evidence suggesting that reinfection is uncommon in the 90 days after initial infection, vaccination should be deferred for at least 90 days, as a precautionary measure until additional information becomes available, to avoid interference of the antibody treatment with vaccine-induced immune responses.  

## 2021-02-10 NOTE — Progress Notes (Signed)
Diagnosis: COVID-19  Physician: Dr. Patrick Wright  Procedure: Covid Infusion Clinic Med: Bebtelovimab Provided patient with Bebtelovimab fact sheet for patients, parents, and caregivers prior to infusion.   Complications: No immediate complications noted  Discharge: Discharged home    

## 2021-03-18 ENCOUNTER — Ambulatory Visit (INDEPENDENT_AMBULATORY_CARE_PROVIDER_SITE_OTHER): Payer: Medicare Other | Admitting: Internal Medicine

## 2021-03-18 ENCOUNTER — Encounter: Payer: Self-pay | Admitting: Internal Medicine

## 2021-03-18 ENCOUNTER — Other Ambulatory Visit: Payer: Self-pay

## 2021-03-18 VITALS — BP 137/57 | HR 76 | Temp 97.8°F | Resp 16 | Ht 67.0 in | Wt 214.0 lb

## 2021-03-18 DIAGNOSIS — J301 Allergic rhinitis due to pollen: Secondary | ICD-10-CM

## 2021-03-18 DIAGNOSIS — R0602 Shortness of breath: Secondary | ICD-10-CM | POA: Diagnosis not present

## 2021-03-18 DIAGNOSIS — J4540 Moderate persistent asthma, uncomplicated: Secondary | ICD-10-CM | POA: Diagnosis not present

## 2021-03-18 DIAGNOSIS — U099 Post covid-19 condition, unspecified: Secondary | ICD-10-CM

## 2021-03-18 NOTE — Progress Notes (Signed)
[facility] [address] [place], [place] [address]  Pulmonary Sleep Medicine   Office Visit Note  Patient Name: Steven [name] DOB: 01/05/1981 MRN [id number]  Date of Service: 03/18/2021  Complaints/HPI: Covid follow up in April, Asthma patient apparently had COVID back in April states that he is doing okay has history of asthma and has been having occasional shortness of breath.  Not clear whether he is having any sputum production has some cough which reportedly is mostly dry he has not had follow-up pulmonary functions done either.  Patient denies having any fevers no chills no chest pain no palpitations  ROS  General: (-) fever, (-) chills, (-) night sweats, (-) weakness Skin: (-) rashes, (-) itching,. Eyes: (-) visual changes, (-) redness, (-) itching. Nose and Sinuses: (-) nasal stuffiness or itchiness, (-) postnasal drip, (-) nosebleeds, (-) sinus trouble. Mouth and Throat: (-) sore throat, (-) hoarseness. Neck: (-) swollen glands, (-) enlarged thyroid, (-) neck pain. Respiratory: + cough, (-) bloody sputum, + shortness of breath, + wheezing. Cardiovascular: - ankle swelling, (-) chest pain. Lymphatic: (-) lymph node enlargement. Neurologic: (-) numbness, (-) tingling. Psychiatric: (-) anxiety, (-) depression   Current Medication: Outpatient Encounter Medications as of 03/18/2021  Medication Sig   acetaminophen (TYLENOL) 500 MG tablet Take 1,000 mg by mouth every 6 (six) hours as needed for moderate pain.   albuterol (PROVENTIL) (2.5 MG/3ML) 0.083% nebulizer solution Inhale 3 mLs (2.5 mg total) into the lungs every 6 (six) hours as needed for wheezing or shortness of breath.   albuterol (VENTOLIN HFA) 108 (90 Base) MCG/ACT inhaler INHALE 2 PUFFS BY MOUTH EVERY 4-6 HOURS   ATROVENT HFA 17 MCG/ACT inhaler Inhale 2 puffs into the lungs every 6 (six) hours as needed for wheezing.   BREO ELLIPTA 100-25 MCG/INH AEPB Inhale 1 puff into the lungs daily.    cetirizine HCl (ZYRTEC) 5 MG/5ML SOLN Take 15 mLs (15 mg total) by mouth daily.   clonazePAM (KLONOPIN) 1 MG tablet Take 1 mg by mouth 3 (three) times daily as needed.   COMBIVENT RESPIMAT 20-100 MCG/ACT AERS respimat Use as directed   EPIPEN 2-PAK 0.3 MG/0.3ML SOAJ injection 0.3 mg.    escitalopram (LEXAPRO) 20 MG tablet TAKE 1 TABLET BY MOUTH ONCE DAILY.   fluticasone (FLONASE) 50 MCG/ACT nasal spray Place 1 spray into both nostrils daily.   KAPSPARGO SPRINKLE 25 MG CS24 Take 1 capsule by mouth daily.   OXYGEN Inhale into the lungs. 2LITERS AT NIGHT AND WHEN SICK   fluticasone (FLOVENT HFA) 220 MCG/ACT inhaler Inhale 2 puffs into the lungs 2 (two) times daily.   omeprazole (PRILOSEC) 40 MG capsule Take 1 capsule (40 mg total) by mouth in the morning and at bedtime.   No facility-administered encounter medications on file as of 03/18/2021.    Surgical History: Past Surgical History:  Procedure Laterality Date   ESOPHAGOGASTRODUODENOSCOPY N/A 02/18/2019   Procedure: ESOPHAGOGASTRODUODENOSCOPY (EGD);  Surgeon: Toledo, Boykin Nearing, MD;  Location: [facility] ENDOSCOPY;  Service: Gastroenterology;  Laterality: N/A;   ESOPHAGOGASTRODUODENOSCOPY (EGD) WITH PROPOFOL N/A 09/05/2017   Procedure: ESOPHAGOGASTRODUODENOSCOPY (EGD) WITH PROPOFOL;  Surgeon: Toledo, Boykin Nearing, MD;  Location: [facility] ENDOSCOPY;  Service: Gastroenterology;  Laterality: N/A;   ESOPHAGOGASTRODUODENOSCOPY (EGD) WITH PROPOFOL N/A 01/22/2020   Procedure: ESOPHAGOGASTRODUODENOSCOPY (EGD) WITH PROPOFOL;  Surgeon: Toledo, Boykin Nearing, MD;  Location: [facility] ENDOSCOPY;  Service: Gastroenterology;  Laterality: N/A;   FOREIGN BODY REMOVAL     FOREIGN BODY REMOVAL N/A 12/16/2019   Procedure: FOREIGN BODY  REMOVAL;  Surgeon: Toney Reil, MD;  Location: [facility] ENDOSCOPY;  Service: Gastroenterology;  Laterality: N/A;   none     TUBES IN EARS      Medical History: Past Medical History:  Diagnosis Date   Anxiety    Arthritis    knee   Asthma     Chronic mental illness    COPD (chronic obstructive pulmonary disease) (HCC)    Depression    Eosinophilic esophagitis    GERD (gastroesophageal reflux disease)    Heart palpitations    Hiatal hernia    Panic attack    Pulmonary fibrosis (HCC)     Family History: Family History  Problem Relation Age of Onset   COPD Mother    Asthma Mother    Mental illness Mother    COPD Father    Pulmonary fibrosis Father    Lung cancer Father     Social History: Social History   Socioeconomic History   Marital status: Married    Spouse name: heather   Number of children: 4   Years of education: Not on file   Highest education level: Associate degree: occupational, Scientist, product/process development, or vocational program  Occupational History   Not on file  Tobacco Use   Smoking status: Former Smoker    Packs/day: 0.50    Years: 2.00    Pack years: 1.00    Types: Cigarettes    Quit date: 10/31/2000    Years since quitting: 20.3   Smokeless tobacco: Current User    Types: Chew   Tobacco comment: 2003  Vaping Use   Vaping Use: Never used  Substance and Sexual Activity   Alcohol use: No    Alcohol/week: 0.0 standard drinks   Drug use: No   Sexual activity: Yes  Other Topics Concern   Not on file  Social History Narrative   Not on file   Social Determinants of Health   Financial Resource Strain: Not on file  Food Insecurity: Not on file  Transportation Needs: Not on file  Physical Activity: Not on file  Stress: Not on file  Social Connections: Not on file  Intimate Partner Violence: Not on file    Vital Signs: Blood pressure (!) 137/57, pulse 76, temperature 97.8 F (36.6 C), resp. rate 16, height 5\' 7"  (1.702 m), weight 214 lb (97.1 kg), SpO2 97 %.  Examination: General Appearance: The patient is well-developed, well-nourished, and in no distress. Skin: Gross inspection of skin unremarkable. Head: normocephalic, no gross deformities. Eyes: no gross deformities noted. ENT: ears appear  grossly normal no exudates. Neck: Supple. No thyromegaly. No LAD. Respiratory: few crackles noted. Cardiovascular: Normal S1 and S2 without murmur or rub. Extremities: No cyanosis. pulses are equal. Neurologic: Alert and oriented. No involuntary movements.  LABS: Recent Results (from the past 2160 hour(s))  I-STAT creatinine     Status: None   Collection Time: 12/31/20  3:39 PM  Result Value Ref Range   Creatinine, Ser 0.90 0.61 - 1.24 mg/dL    Radiology: No results found.  No results found.  No results found.    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/08/2019   Intermittent explosive disorder in adult 05/08/2019   Gastroesophageal reflux disease without esophagitis 02/18/2019   PVC's (premature ventricular contractions) 01/31/2019   Allergy to meat 10/11/2018   Chronic left upper quadrant pain 10/11/2018   Functional dyspepsia 10/11/2018   Heart palpitations 05/31/2018   Anxiety, generalized 05/31/2018   Asthma, chronic 04/05/2015   Chronic obstructive airway disease with asthma (HCC) 03/17/2015   Obesity 03/17/2015   DOE (dyspnea on exertion) 03/17/2015   Environmental allergies 04/03/2014   Chest pain 08/02/2013   Thyroid nodule 03/21/2013    1. Post covid-19 condition, unspecified Concern for COVID long-haul her suggested that we get a follow-up CT scan done for evaluation of possibility of pulmonary fibrosis this will be ordered today and then we will see him back after it is done - CT Chest High Resolution; Future  2. SOB (shortness of breath) Multifactorial again COVID probably  playing a major role in this and would recommend doing follow-up complete pulmonary function spirometry was done today - Spirometry with Graph  3. Moderate persistent extrinsic asthma without complication Underlying asthma by history inhaler should be continued. - Pulmonary function test; Future  4. Seasonal allergic rhinitis due to pollen These are under control use as needed antihistamines.  General Counseling: I have discussed the findings of the evaluation and examination with Harvie Heck.  I have also discussed any further diagnostic evaluation thatmay be needed or ordered today. Ephraim verbalizes understanding of the findings of todays visit. We also reviewed his medications today and discussed drug interactions and side effects including but not limited excessive drowsiness and altered mental states. We also discussed that there is always a risk not just to him but also people around him. he has been encouraged to call the office with any questions or concerns that should arise related to todays visit.  Orders Placed This Encounter  Procedures   Spirometry with Graph    Order Specific Question:   Where should this test be performed?    Answer:   Other    Order Specific Question:   Basic spirometry    Answer:   Yes     Time spent: 11  I have personally obtained a history, examined the patient, evaluated laboratory and imaging results, formulated the assessment and plan and placed orders.    Yevonne Pax, MD [facility] Pulmonary and Critical Care Sleep medicine

## 2021-03-18 NOTE — Patient Instructions (Signed)
Asthma, Adult  Asthma is a long-term (chronic) condition in which the airways get tight and narrow. The airways are the breathing passages that lead from the nose and mouth down into the lungs. A person with asthma will have times when symptoms get worse. These are called asthma attacks. They can cause coughing, whistling sounds when you breathe (wheezing), shortness of breath, and chest pain. They can make it hard to breathe. There is no cure for asthma, but medicines and lifestyle changes can help control it. There are many things that can bring on an asthma attack or make asthma symptoms worse (triggers). Common triggers include:  Mold.  Dust.  Cigarette smoke.  Cockroaches.  Things that can cause allergy symptoms (allergens). These include animal skin flakes (dander) and pollen from trees or grass.  Things that pollute the air. These may include household cleaners, wood smoke, smog, or chemical odors.  Cold air, weather changes, and wind.  Crying or laughing hard.  Stress.  Certain medicines or drugs.  Certain foods such as dried fruit, potato chips, and grape juice.  Infections, such as a cold or the flu.  Certain medical conditions or diseases.  Exercise or tiring activities. Asthma may be treated with medicines and by staying away from the things that cause asthma attacks. Types of medicines may include:  Controller medicines. These help prevent asthma symptoms. They are usually taken every day.  Fast-acting reliever or rescue medicines. These quickly relieve asthma symptoms. They are used as needed and provide short-term relief.  Allergy medicines if your attacks are brought on by allergens.  Medicines to help control the body's defense (immune) system. Follow these instructions at home: Avoiding triggers in your home  Change your heating and air conditioning filter often.  Limit your use of fireplaces and wood stoves.  Get rid of pests (such as roaches and  mice) and their droppings.  Throw away plants if you see mold on them.  Clean your floors. Dust regularly. Use cleaning products that do not smell.  Have someone vacuum when you are not home. Use a vacuum cleaner with a HEPA filter if possible.  Replace carpet with wood, tile, or vinyl flooring. Carpet can trap animal skin flakes and dust.  Use allergy-proof pillows, mattress covers, and box spring covers.  Wash bed sheets and blankets every week in hot water. Dry them in a dryer.  Keep your bedroom free of any triggers.  Avoid pets and keep windows closed when things that cause allergy symptoms are in the air.  Use blankets that are made of polyester or cotton.  Clean bathrooms and kitchens with bleach. If possible, have someone repaint the walls in these rooms with mold-resistant paint. Keep out of the rooms that are being cleaned and painted.  Wash your hands often with soap and water. If soap and water are not available, use hand sanitizer.  Do not allow anyone to smoke in your home. General instructions  Take over-the-counter and prescription medicines only as told by your doctor. ? Talk with your doctor if you have questions about how or when to take your medicines. ? Make note if you need to use your medicines more often than usual.  Do not use any products that contain nicotine or tobacco, such as cigarettes and e-cigarettes. If you need help quitting, ask your doctor.  Stay away from secondhand smoke.  Avoid doing things outdoors when allergen counts are high and when air quality is low.  Wear a ski mask   when doing outdoor activities in the winter. The mask should cover your nose and mouth. Exercise indoors on cold days if you can.  Warm up before you exercise. Take time to cool down after exercise.  Use a peak flow meter as told by your doctor. A peak flow meter is a tool that measures how well the lungs are working.  Keep track of the peak flow meter's readings.  Write them down.  Follow your asthma action plan. This is a written plan for taking care of your asthma and treating your attacks.  Make sure you get all the shots (vaccines) that your doctor recommends. Ask your doctor about a flu shot and a pneumonia shot.  Keep all follow-up visits as told by your doctor. This is important. Contact a doctor if:  You have wheezing, shortness of breath, or a cough even while taking medicine to prevent attacks.  The mucus you cough up (sputum) is thicker than usual.  The mucus you cough up changes from clear or white to yellow, green, gray, or bloody.  You have problems from the medicine you are taking, such as: ? A rash. ? Itching. ? Swelling. ? Trouble breathing.  You need reliever medicines more than 2-3 times a week.  Your peak flow reading is still at 50-79% of your personal best after following the action plan for 1 hour.  You have a fever. Get help right away if:  You seem to be worse and are not responding to medicine during an asthma attack.  You are short of breath even at rest.  You get short of breath when doing very little activity.  You have trouble eating, drinking, or talking.  You have chest pain or tightness.  You have a fast heartbeat.  Your lips or fingernails start to turn blue.  You are light-headed or dizzy, or you faint.  Your peak flow is less than 50% of your personal best.  You feel too tired to breathe normally. Summary  Asthma is a long-term (chronic) condition in which the airways get tight and narrow. An asthma attack can make it hard to breathe.  Asthma cannot be cured, but medicines and lifestyle changes can help control it.  Make sure you understand how to avoid triggers and how and when to use your medicines. This information is not intended to replace advice given to you by your health care provider. Make sure you discuss any questions you have with your health care provider. Document Revised:  02/19/2020 Document Reviewed: 02/19/2020 [employer] Patient Education  2021 [employer]   

## 2021-03-31 ENCOUNTER — Ambulatory Visit: Payer: Medicare Other | Admitting: Internal Medicine

## 2021-04-02 ENCOUNTER — Ambulatory Visit
Admission: RE | Admit: 2021-04-02 | Discharge: 2021-04-02 | Disposition: A | Discharge status: Home / Self Care | Payer: Medicare Other | Source: Ambulatory Visit | Attending: Internal Medicine | Admitting: Internal Medicine

## 2021-04-02 ENCOUNTER — Other Ambulatory Visit: Payer: Self-pay

## 2021-04-02 DIAGNOSIS — U099 Post covid-19 condition, unspecified: Secondary | ICD-10-CM | POA: Insufficient documentation

## 2021-04-02 DIAGNOSIS — J8410 Pulmonary fibrosis, unspecified: Secondary | ICD-10-CM | POA: Insufficient documentation

## 2021-04-28 ENCOUNTER — Ambulatory Visit: Payer: Medicare Other | Admitting: Internal Medicine

## 2021-04-29 ENCOUNTER — Ambulatory Visit (INDEPENDENT_AMBULATORY_CARE_PROVIDER_SITE_OTHER): Payer: Medicare Other | Admitting: Internal Medicine

## 2021-04-29 ENCOUNTER — Encounter: Payer: Self-pay | Admitting: Internal Medicine

## 2021-04-29 ENCOUNTER — Other Ambulatory Visit: Payer: Self-pay

## 2021-04-29 VITALS — BP 124/90 | HR 78 | Temp 97.3°F | Resp 16 | Ht 67.0 in | Wt 204.6 lb

## 2021-04-29 DIAGNOSIS — U099 Post covid-19 condition, unspecified: Secondary | ICD-10-CM | POA: Diagnosis not present

## 2021-04-29 DIAGNOSIS — J301 Allergic rhinitis due to pollen: Secondary | ICD-10-CM

## 2021-04-29 DIAGNOSIS — J4540 Moderate persistent asthma, uncomplicated: Secondary | ICD-10-CM | POA: Diagnosis not present

## 2021-04-29 LAB — PULMONARY FUNCTION TEST

## 2021-04-29 NOTE — Progress Notes (Signed)
[facility] [address] [place], [place] [address]  Pulmonary Sleep Medicine   Office Visit Note  Patient Name: Steven [name] DOB: 1981/09/19 MRN [id number]  Date of Service: 04/29/2021  Complaints/HPI: Post Covid. He had a Ct scan done which shows no GGOs and some scarring noted which is not severe. Patient has been doing better with his breathing. Cleda Daub shows an improvement also. Patient has no cough noted at this time denies having CP. Patient is supposed to have dental surgery for which he required clearence  ROS  General: (-) fever, (-) chills, (-) night sweats, (-) weakness Skin: (-) rashes, (-) itching,. Eyes: (-) visual changes, (-) redness, (-) itching. Nose and Sinuses: (-) nasal stuffiness or itchiness, (-) postnasal drip, (-) nosebleeds, (-) sinus trouble. Mouth and Throat: (-) sore throat, (-) hoarseness. Neck: (-) swollen glands, (-) enlarged thyroid, (-) neck pain. Respiratory: + cough, (-) bloody sputum, - shortness of breath, - wheezing. Cardiovascular: - ankle swelling, (-) chest pain. Lymphatic: (-) lymph node enlargement. Neurologic: (-) numbness, (-) tingling. Psychiatric: (-) anxiety, (-) depression   Current Medication: Outpatient Encounter Medications as of 04/29/2021  Medication Sig   acetaminophen (TYLENOL) 500 MG tablet Take 1,000 mg by mouth every 6 (six) hours as needed for moderate pain.   albuterol (PROVENTIL) (2.5 MG/3ML) 0.083% nebulizer solution Inhale 3 mLs (2.5 mg total) into the lungs every 6 (six) hours as needed for wheezing or shortness of breath.   albuterol (VENTOLIN HFA) 108 (90 Base) MCG/ACT inhaler INHALE 2 PUFFS BY MOUTH EVERY 4-6 HOURS   ATROVENT HFA 17 MCG/ACT inhaler Inhale 2 puffs into the lungs every 6 (six) hours as needed for wheezing.   BREO ELLIPTA 100-25 MCG/INH AEPB Inhale 1 puff into the lungs daily.   cetirizine HCl (ZYRTEC) 5 MG/5ML SOLN Take 15 mLs (15 mg total) by mouth daily.   clonazePAM (KLONOPIN)  1 MG tablet Take 1 mg by mouth 3 (three) times daily as needed.   COMBIVENT RESPIMAT 20-100 MCG/ACT AERS respimat Use as directed   EPIPEN 2-PAK 0.3 MG/0.3ML SOAJ injection 0.3 mg.    escitalopram (LEXAPRO) 20 MG tablet TAKE 1 TABLET BY MOUTH ONCE DAILY.   fluticasone (FLONASE) 50 MCG/ACT nasal spray Place 1 spray into both nostrils daily.   KAPSPARGO SPRINKLE 25 MG CS24 Take 1 capsule by mouth daily.   OXYGEN Inhale into the lungs. 2LITERS AT NIGHT AND WHEN SICK   fluticasone (FLOVENT HFA) 220 MCG/ACT inhaler Inhale 2 puffs into the lungs 2 (two) times daily.   omeprazole (PRILOSEC) 40 MG capsule Take 1 capsule (40 mg total) by mouth in the morning and at bedtime.   No facility-administered encounter medications on file as of 04/29/2021.    Surgical History: Past Surgical History:  Procedure Laterality Date   ESOPHAGOGASTRODUODENOSCOPY N/A 02/18/2019   Procedure: ESOPHAGOGASTRODUODENOSCOPY (EGD);  Surgeon: Toledo, Boykin Nearing, MD;  Location: [facility] ENDOSCOPY;  Service: Gastroenterology;  Laterality: N/A;   ESOPHAGOGASTRODUODENOSCOPY (EGD) WITH PROPOFOL N/A 09/05/2017   Procedure: ESOPHAGOGASTRODUODENOSCOPY (EGD) WITH PROPOFOL;  Surgeon: Toledo, Boykin Nearing, MD;  Location: [facility] ENDOSCOPY;  Service: Gastroenterology;  Laterality: N/A;   ESOPHAGOGASTRODUODENOSCOPY (EGD) WITH PROPOFOL N/A 01/22/2020   Procedure: ESOPHAGOGASTRODUODENOSCOPY (EGD) WITH PROPOFOL;  Surgeon: Toledo, Boykin Nearing, MD;  Location: [facility] ENDOSCOPY;  Service: Gastroenterology;  Laterality: N/A;   FOREIGN BODY REMOVAL     FOREIGN BODY REMOVAL N/A 12/16/2019   Procedure: FOREIGN BODY REMOVAL;  Surgeon: Toney Reil, MD;  Location: [facility] ENDOSCOPY;  Service: Gastroenterology;  Laterality: N/A;   none     TUBES IN EARS      Medical History: Past Medical History:  Diagnosis Date   Anxiety    Arthritis    knee   Asthma    Chronic mental illness    COPD (chronic obstructive pulmonary disease) (HCC)    Depression     Eosinophilic esophagitis    GERD (gastroesophageal reflux disease)    Heart palpitations    Hiatal hernia    Panic attack    Pulmonary fibrosis (HCC)     Family History: Family History  Problem Relation Age of Onset   COPD Mother    Asthma Mother    Mental illness Mother    COPD Father    Pulmonary fibrosis Father    Lung cancer Father     Social History: Social History   Socioeconomic History   Marital status: Married    Spouse name: heather   Number of children: 4   Years of education: Not on file   Highest education level: Associate degree: occupational, Scientist, product/process development, or vocational program  Occupational History   Not on file  Tobacco Use   Smoking status: Former    Packs/day: 0.50    Years: 2.00    Pack years: 1.00    Types: Cigarettes    Quit date: 10/31/2000    Years since quitting: 20.5   Smokeless tobacco: Current    Types: Chew   Tobacco comments:    2003  Vaping Use   Vaping Use: Never used  Substance and Sexual Activity   Alcohol use: No    Alcohol/week: 0.0 standard drinks   Drug use: No   Sexual activity: Yes  Other Topics Concern   Not on file  Social History Narrative   Not on file   Social Determinants of Health   Financial Resource Strain: Not on file  Food Insecurity: Not on file  Transportation Needs: Not on file  Physical Activity: Not on file  Stress: Not on file  Social Connections: Not on file  Intimate Partner Violence: Not on file    Vital Signs: Blood pressure 124/90, pulse 78, temperature (!) 97.3 F (36.3 C), resp. rate 16, height 5\' 7"  (1.702 m), weight 204 lb 9.6 oz (92.8 kg), SpO2 98 %.  Examination: General Appearance: The patient is well-developed, well-nourished, and in no distress. Skin: Gross inspection of skin unremarkable. Head: normocephalic, no gross deformities. Eyes: no gross deformities noted. ENT: ears appear grossly normal no exudates. Neck: Supple. No thyromegaly. No LAD. Respiratory: no rhonchi noted  at this time. Cardiovascular: Normal S1 and S2 without murmur or rub. Extremities: No cyanosis. pulses are equal. Neurologic: Alert and oriented. No involuntary movements.  LABS: Recent Results (from the past 2160 hour(s))  Pulmonary function test     Status: None   Collection Time: 04/29/21 12:01 PM  Result Value Ref Range   FEV1     FVC     FEV1/FVC     TLC     DLCO      Radiology: CT Chest High Resolution  Result Date: 04/03/2021 CLINICAL DATA:  Pulmonary fibrosis, continued shortness of breath, COVID infection in April, former smoker history of asthma EXAM: CT CHEST WITHOUT CONTRAST TECHNIQUE: Multidetector CT imaging of the chest was performed following the standard protocol without intravenous contrast. High resolution imaging of the lungs, as well as inspiratory and expiratory imaging, was performed. COMPARISON:  CT coronary angiogram, 12/31/2020 FINDINGS: Cardiovascular: No significant vascular findings. Normal  heart size. No pericardial effusion. Mediastinum/Nodes: No enlarged mediastinal, hilar, or axillary lymph nodes. Thyroid gland, trachea, and esophagus demonstrate no significant findings. Lungs/Pleura: Mild, bland appearing bandlike scarring of bilateral lung bases (series 5, image 237). No evidence of fibrotic interstitial lung disease. No significant air trapping on expiratory phase imaging. No pleural effusion or pneumothorax. Upper Abdomen: No acute abnormality. Musculoskeletal: No chest wall mass or suspicious bone lesions identified. IMPRESSION: 1. Mild, bland appearing bandlike scarring of bilateral lung bases. No evidence of fibrotic interstitial lung disease. 2.  No acute abnormality of the lungs. Electronically Signed   By: Lauralyn Primes M.D.   On: 04/03/2021 11:33    No results found.  CT Chest High Resolution  Result Date: 04/03/2021 CLINICAL DATA:  Pulmonary fibrosis, continued shortness of breath, COVID infection in April, former smoker history of asthma EXAM: CT  CHEST WITHOUT CONTRAST TECHNIQUE: Multidetector CT imaging of the chest was performed following the standard protocol without intravenous contrast. High resolution imaging of the lungs, as well as inspiratory and expiratory imaging, was performed. COMPARISON:  CT coronary angiogram, 12/31/2020 FINDINGS: Cardiovascular: No significant vascular findings. Normal heart size. No pericardial effusion. Mediastinum/Nodes: No enlarged mediastinal, hilar, or axillary lymph nodes. Thyroid gland, trachea, and esophagus demonstrate no significant findings. Lungs/Pleura: Mild, bland appearing bandlike scarring of bilateral lung bases (series 5, image 237). No evidence of fibrotic interstitial lung disease. No significant air trapping on expiratory phase imaging. No pleural effusion or pneumothorax. Upper Abdomen: No acute abnormality. Musculoskeletal: No chest wall mass or suspicious bone lesions identified. IMPRESSION: 1. Mild, bland appearing bandlike scarring of bilateral lung bases. No evidence of fibrotic interstitial lung disease. 2.  No acute abnormality of the lungs. Electronically Signed   By: Lauralyn Primes M.D.   On: 04/03/2021 11:33      Assessment and Plan: Patient Active Problem List   Diagnosis Date Noted   Foreign body in esophagus    Eosinophilic esophagitis    Dysphagia 12/15/2019   High risk medication use 07/18/2019   Flexor tendon laceration, wrist, open wound, left, subsequent encounter 05/20/2019   GAD (generalized anxiety disorder) 05/08/2019   Panic disorder 05/08/2019   MDD (major depressive disorder), recurrent episode, mild (HCC) 05/08/2019   Intermittent explosive disorder in adult 05/08/2019   Gastroesophageal reflux disease without esophagitis 02/18/2019   PVC's (premature ventricular contractions) 01/31/2019   Allergy to meat 10/11/2018   Chronic left upper quadrant pain 10/11/2018   Functional dyspepsia 10/11/2018   Heart palpitations 05/31/2018   Anxiety, generalized  05/31/2018   Asthma, chronic 04/05/2015   Chronic obstructive airway disease with asthma (HCC) 03/17/2015   Obesity 03/17/2015   DOE (dyspnea on exertion) 03/17/2015   Environmental allergies 04/03/2014   Chest pain 08/02/2013   Thyroid nodule 03/21/2013    1. Moderate persistent extrinsic asthma without complication Improving patient seems o clinically doing well also - Pulmonary function test  2. Post covid-19 condition, unspecified CT scan done and shows no GGOs clinically and radiologically improved  3. Seasonal allergic rhinitis due to pollen Controlled at this time   General Counseling: I have discussed the findings of the evaluation and examination with Harvie Heck.  I have also discussed any further diagnostic evaluation thatmay be needed or ordered today. Ralphie verbalizes understanding of the findings of todays visit. We also reviewed his medications today and discussed drug interactions and side effects including but not limited excessive drowsiness and altered mental states. We also discussed that there is always a risk not  just to him but also people around him. he has been encouraged to call the office with any questions or concerns that should arise related to todays visit.  No orders of the defined types were placed in this encounter.    Time spent: 62  I have personally obtained a history, examined the patient, evaluated laboratory and imaging results, formulated the assessment and plan and placed orders.    Allyne Gee, MD [facility] Pulmonary and Critical Care Sleep medicine

## 2021-04-29 NOTE — Patient Instructions (Signed)

## 2021-05-12 ENCOUNTER — Ambulatory Visit: Payer: Medicare Other | Admitting: Internal Medicine

## 2021-05-14 ENCOUNTER — Telehealth: Payer: Self-pay

## 2021-05-14 NOTE — Telephone Encounter (Signed)
Completed medical records for [facility] Mailed to [id number] [address] [place], [place], [address]  

## 2021-05-26 ENCOUNTER — Ambulatory Visit: Payer: Medicare Other | Admitting: Internal Medicine

## 2021-06-07 ENCOUNTER — Ambulatory Visit: Payer: Medicare Other | Admitting: Internal Medicine

## 2021-06-22 ENCOUNTER — Other Ambulatory Visit: Payer: Self-pay | Admitting: Nurse Practitioner

## 2021-06-22 DIAGNOSIS — J4540 Moderate persistent asthma, uncomplicated: Secondary | ICD-10-CM

## 2021-06-23 ENCOUNTER — Ambulatory Visit: Payer: Medicare Other | Admitting: Internal Medicine

## 2021-06-23 ENCOUNTER — Ambulatory Visit (INDEPENDENT_AMBULATORY_CARE_PROVIDER_SITE_OTHER): Payer: Medicare Other | Admitting: Internal Medicine

## 2021-06-23 ENCOUNTER — Other Ambulatory Visit: Payer: Self-pay

## 2021-06-23 DIAGNOSIS — J4540 Moderate persistent asthma, uncomplicated: Secondary | ICD-10-CM

## 2021-06-23 LAB — PULMONARY FUNCTION TEST

## 2021-06-30 NOTE — Procedures (Signed)
[facility] [address] [place], [address]    Complete Pulmonary Function Testing Interpretation:  FINDINGS:  The forced vital capacity is normal.  FEV1 is 2.83 L which is 73% of predicted and is mildly decreased.  FEV1 FVC ratio is moderately decreased.  Total lung capacity was increased residual volume is increased residual internal capacity ratio is increased.  Patient did not perform the maneuvers for DLCO.  IMPRESSION:  This pulmonary function study is suggestive of mild obstructive lung disease.  There did not seem to be significant response to bronchodilators clinical improvement may still occur in the absence of spirometric improvement  Yevonne Pax, MD [facility] [place] Pulmonary Critical Care Medicine Sleep Medicine

## 2021-07-06 ENCOUNTER — Ambulatory Visit (INDEPENDENT_AMBULATORY_CARE_PROVIDER_SITE_OTHER): Payer: Medicare Other | Admitting: Internal Medicine

## 2021-07-06 ENCOUNTER — Other Ambulatory Visit: Payer: Self-pay

## 2021-07-06 ENCOUNTER — Encounter: Payer: Self-pay | Admitting: Internal Medicine

## 2021-07-06 VITALS — HR 100 | Temp 98.7°F | Resp 16 | Ht 67.0 in | Wt 203.0 lb

## 2021-07-06 DIAGNOSIS — J4540 Moderate persistent asthma, uncomplicated: Secondary | ICD-10-CM

## 2021-07-06 DIAGNOSIS — Z9981 Dependence on supplemental oxygen: Secondary | ICD-10-CM

## 2021-07-06 DIAGNOSIS — I471 Supraventricular tachycardia, unspecified: Secondary | ICD-10-CM

## 2021-07-06 DIAGNOSIS — J301 Allergic rhinitis due to pollen: Secondary | ICD-10-CM | POA: Diagnosis not present

## 2021-07-06 NOTE — Progress Notes (Signed)
[facility] [address] [place], [place] [address]  Pulmonary Sleep Medicine   Office Visit Note  Patient Name: Steven [name] DOB: 23-Nov-1980 MRN [id number]  Date of Service: 07/06/2021  Complaints/HPI: Asthma PFT.  He is doing okay has not had any recent flareups or exacerbations of his breathing.  Denies having any chest pain or tightness.  No cough or congestion is noted at this time.  No admissions to the hospital.  He still is trying to work on losing weight he has had some success and has had significant weight loss.  ROS  General: (-) fever, (-) chills, (-) night sweats, (-) weakness Skin: (-) rashes, (-) itching,. Eyes: (-) visual changes, (-) redness, (-) itching. Nose and Sinuses: (-) nasal stuffiness or itchiness, (-) postnasal drip, (-) nosebleeds, (-) sinus trouble. Mouth and Throat: (-) sore throat, (-) hoarseness. Neck: (-) swollen glands, (-) enlarged thyroid, (-) neck pain. Respiratory: - cough, (-) bloody sputum, - shortness of breath, - wheezing. Cardiovascular: - ankle swelling, (-) chest pain. Lymphatic: (-) lymph node enlargement. Neurologic: (-) numbness, (-) tingling. Psychiatric: (-) anxiety, (-) depression   Current Medication: Outpatient Encounter Medications as of 07/06/2021  Medication Sig   acetaminophen (TYLENOL) 500 MG tablet Take 1,000 mg by mouth every 6 (six) hours as needed for moderate pain.   albuterol (PROVENTIL) (2.5 MG/3ML) 0.083% nebulizer solution Inhale 3 mLs (2.5 mg total) into the lungs every 6 (six) hours as needed for wheezing or shortness of breath.   albuterol (VENTOLIN HFA) 108 (90 Base) MCG/ACT inhaler INHALE 2 PUFFS BY MOUTH EVERY 4-6 HOURS   ATROVENT HFA 17 MCG/ACT inhaler Inhale 2 puffs into the lungs every 6 (six) hours as needed for wheezing.   BREO ELLIPTA 100-25 MCG/INH AEPB Inhale 1 puff into the lungs daily.   cetirizine HCl (ZYRTEC) 5 MG/5ML SOLN Take 15 mLs (15 mg total) by mouth daily.   clonazePAM  (KLONOPIN) 1 MG tablet Take 1 mg by mouth 3 (three) times daily as needed.   COMBIVENT RESPIMAT 20-100 MCG/ACT AERS respimat Use as directed   EPIPEN 2-PAK 0.3 MG/0.3ML SOAJ injection 0.3 mg.    escitalopram (LEXAPRO) 20 MG tablet TAKE 1 TABLET BY MOUTH ONCE DAILY.   fluticasone (FLONASE) 50 MCG/ACT nasal spray Place 1 spray into both nostrils daily.   KAPSPARGO SPRINKLE 25 MG CS24 Take 1 capsule by mouth daily.   OXYGEN Inhale into the lungs. 2LITERS AT NIGHT AND WHEN SICK   fluticasone (FLOVENT HFA) 220 MCG/ACT inhaler Inhale 2 puffs into the lungs 2 (two) times daily. (Patient taking differently: Inhale 2 puffs into the lungs 2 (two) times daily. Pt was told to swallow 2 puffs due to having EOE)   omeprazole (PRILOSEC) 40 MG capsule Take 1 capsule (40 mg total) by mouth in the morning and at bedtime.   No facility-administered encounter medications on file as of 07/06/2021.    Surgical History: Past Surgical History:  Procedure Laterality Date   ESOPHAGOGASTRODUODENOSCOPY N/A 02/18/2019   Procedure: ESOPHAGOGASTRODUODENOSCOPY (EGD);  Surgeon: Toledo, Boykin Nearing, MD;  Location: [facility] ENDOSCOPY;  Service: Gastroenterology;  Laterality: N/A;   ESOPHAGOGASTRODUODENOSCOPY (EGD) WITH PROPOFOL N/A 09/05/2017   Procedure: ESOPHAGOGASTRODUODENOSCOPY (EGD) WITH PROPOFOL;  Surgeon: Toledo, Boykin Nearing, MD;  Location: [facility] ENDOSCOPY;  Service: Gastroenterology;  Laterality: N/A;   ESOPHAGOGASTRODUODENOSCOPY (EGD) WITH PROPOFOL N/A 01/22/2020   Procedure: ESOPHAGOGASTRODUODENOSCOPY (EGD) WITH PROPOFOL;  Surgeon: Toledo, Boykin Nearing, MD;  Location: [facility] ENDOSCOPY;  Service: Gastroenterology;  Laterality: N/A;   FOREIGN  BODY REMOVAL     FOREIGN BODY REMOVAL N/A 12/16/2019   Procedure: FOREIGN BODY REMOVAL;  Surgeon: Toney Reil, MD;  Location: [facility] ENDOSCOPY;  Service: Gastroenterology;  Laterality: N/A;   none     TUBES IN EARS      Medical History: Past Medical History:  Diagnosis Date   Anxiety     Arthritis    knee   Asthma    Chronic mental illness    COPD (chronic obstructive pulmonary disease) (HCC)    Depression    Eosinophilic esophagitis    GERD (gastroesophageal reflux disease)    Heart palpitations    Hiatal hernia    Panic attack    Pulmonary fibrosis (HCC)     Family History: Family History  Problem Relation Age of Onset   COPD Mother    Asthma Mother    Mental illness Mother    COPD Father    Pulmonary fibrosis Father    Lung cancer Father     Social History: Social History   Socioeconomic History   Marital status: Married    Spouse name: heather   Number of children: 4   Years of education: Not on file   Highest education level: Associate degree: occupational, Scientist, product/process development, or vocational program  Occupational History   Not on file  Tobacco Use   Smoking status: Former    Packs/day: 0.50    Years: 2.00    Pack years: 1.00    Types: Cigarettes    Quit date: 10/31/2000    Years since quitting: 20.6   Smokeless tobacco: Current    Types: Chew   Tobacco comments:    2003  Vaping Use   Vaping Use: Never used  Substance and Sexual Activity   Alcohol use: No    Alcohol/week: 0.0 standard drinks   Drug use: No   Sexual activity: Yes  Other Topics Concern   Not on file  Social History Narrative   Not on file   Social Determinants of Health   Financial Resource Strain: Not on file  Food Insecurity: Not on file  Transportation Needs: Not on file  Physical Activity: Not on file  Stress: Not on file  Social Connections: Not on file  Intimate Partner Violence: Not on file    Vital Signs: Pulse 100, temperature 98.7 F (37.1 C), resp. rate 16, height 5\' 7"  (1.702 m), weight 203 lb (92.1 kg), SpO2 98 %.  Examination: General Appearance: The patient is well-developed, well-nourished, and in no distress. Skin: Gross inspection of skin unremarkable. Head: normocephalic, no gross deformities. Eyes: no gross deformities noted. ENT: ears  appear grossly normal no exudates. Neck: Supple. No thyromegaly. No LAD. Respiratory: no rhonchi noted at this time. Cardiovascular: Normal S1 and S2 without murmur or rub. Extremities: No cyanosis. pulses are equal. Neurologic: Alert and oriented. No involuntary movements.  LABS: Recent Results (from the past 2160 hour(s))  Pulmonary function test     Status: None   Collection Time: 04/29/21 12:01 PM  Result Value Ref Range   FEV1     FVC     FEV1/FVC     TLC     DLCO      Radiology: CT Chest High Resolution  Result Date: 04/03/2021 CLINICAL DATA:  Pulmonary fibrosis, continued shortness of breath, COVID infection in April, former smoker history of asthma EXAM: CT CHEST WITHOUT CONTRAST TECHNIQUE: Multidetector CT imaging of the chest was performed following the standard protocol without intravenous contrast. High resolution imaging  of the lungs, as well as inspiratory and expiratory imaging, was performed. COMPARISON:  CT coronary angiogram, 12/31/2020 FINDINGS: Cardiovascular: No significant vascular findings. Normal heart size. No pericardial effusion. Mediastinum/Nodes: No enlarged mediastinal, hilar, or axillary lymph nodes. Thyroid gland, trachea, and esophagus demonstrate no significant findings. Lungs/Pleura: Mild, bland appearing bandlike scarring of bilateral lung bases (series 5, image 237). No evidence of fibrotic interstitial lung disease. No significant air trapping on expiratory phase imaging. No pleural effusion or pneumothorax. Upper Abdomen: No acute abnormality. Musculoskeletal: No chest wall mass or suspicious bone lesions identified. IMPRESSION: 1. Mild, bland appearing bandlike scarring of bilateral lung bases. No evidence of fibrotic interstitial lung disease. 2.  No acute abnormality of the lungs. Electronically Signed   By: Lauralyn Primes M.D.   On: 04/03/2021 11:33    No results found.  No results found.    Assessment and Plan: Patient Active Problem List    Diagnosis Date Noted   Foreign body in esophagus    Eosinophilic esophagitis    Dysphagia 12/15/2019   High risk medication use 07/18/2019   Flexor tendon laceration, wrist, open wound, left, subsequent encounter 05/20/2019   GAD (generalized anxiety disorder) 05/08/2019   Panic disorder 05/08/2019   MDD (major depressive disorder), recurrent episode, mild (HCC) 05/08/2019   Intermittent explosive disorder in adult 05/08/2019   Gastroesophageal reflux disease without esophagitis 02/18/2019   PVC's (premature ventricular contractions) 01/31/2019   Allergy to meat 10/11/2018   Chronic left upper quadrant pain 10/11/2018   Functional dyspepsia 10/11/2018   Heart palpitations 05/31/2018   Anxiety, generalized 05/31/2018   Asthma, chronic 04/05/2015   Chronic obstructive airway disease with asthma (HCC) 03/17/2015   Obesity 03/17/2015   DOE (dyspnea on exertion) 03/17/2015   Environmental allergies 04/03/2014   Chest pain 08/02/2013   Thyroid nodule 03/21/2013    1. Moderate persistent extrinsic asthma without complication Under good control.  Plan is going to be to continue with current medication management.  He has not had any admissions required to the hospital.  2. Dependence on nocturnal oxygen therapy Continue with oxygen therapy as prescribed  3. SVT (supraventricular tachycardia) (HCC) Rate has been under good control we will continue to follow along closely  4. Seasonal allergic rhinitis due to pollen Antiallergens as prescribed as needed.   General Counseling: I have discussed the findings of the evaluation and examination with Harvie Heck.  I have also discussed any further diagnostic evaluation thatmay be needed or ordered today. Atul verbalizes understanding of the findings of todays visit. We also reviewed his medications today and discussed drug interactions and side effects including but not limited excessive drowsiness and altered mental states. We also discussed that  there is always a risk not just to him but also people around him. he has been encouraged to call the office with any questions or concerns that should arise related to todays visit.  No orders of the defined types were placed in this encounter.    Time spent: 62  I have personally obtained a history, examined the patient, evaluated laboratory and imaging results, formulated the assessment and plan and placed orders.    Yevonne Pax, MD [facility] Pulmonary and Critical Care Sleep medicine

## 2021-07-06 NOTE — Patient Instructions (Signed)
Asthma, Adult  Asthma is a long-term (chronic) condition in which the airways get tight and narrow. The airways are the breathing passages that lead from the nose and mouth down into the lungs. A person with asthma will have times when symptoms get worse. These are called asthma attacks. They can cause coughing, whistling sounds when you breathe (wheezing), shortness of breath, and chest pain. They can make it hard to breathe. Thereis no cure for asthma, but medicines and lifestyle changes can help control it. There are many things that can bring on an asthma attack or make asthma symptoms worse (triggers). Common triggers include: Mold. Dust. Cigarette smoke. Cockroaches. Things that can cause allergy symptoms (allergens). These include animal skin flakes (dander) and pollen from trees or grass. Things that pollute the air. These may include household cleaners, wood smoke, smog, or chemical odors. Cold air, weather changes, and wind. Crying or laughing hard. Stress. Certain medicines or drugs. Certain foods such as dried fruit, potato chips, and grape juice. Infections, such as a cold or the flu. Certain medical conditions or diseases. Exercise or tiring activities. Asthma may be treated with medicines and by staying away from the things that cause asthma attacks. Types of medicines may include: Controller medicines. These help prevent asthma symptoms. They are usually taken every day. Fast-acting reliever or rescue medicines. These quickly relieve asthma symptoms. They are used as needed and provide short-term relief. Allergy medicines if your attacks are brought on by allergens. Medicines to help control the body's defense (immune) system. Follow these instructions at home: Avoiding triggers in your home Change your heating and air conditioning filter often. Limit your use of fireplaces and wood stoves. Get rid of pests (such as roaches and mice) and their droppings. Throw away plants  if you see mold on them. Clean your floors. Dust regularly. Use cleaning products that do not smell. Have someone vacuum when you are not home. Use a vacuum cleaner with a HEPA filter if possible. Replace carpet with wood, tile, or vinyl flooring. Carpet can trap animal skin flakes and dust. Use allergy-proof pillows, mattress covers, and box spring covers. Wash bed sheets and blankets every week in hot water. Dry them in a dryer. Keep your bedroom free of any triggers. Avoid pets and keep windows closed when things that cause allergy symptoms are in the air. Use blankets that are made of polyester or cotton. Clean bathrooms and kitchens with bleach. If possible, have someone repaint the walls in these rooms with mold-resistant paint. Keep out of the rooms that are being cleaned and painted. Wash your hands often with soap and water. If soap and water are not available, use hand sanitizer. Do not allow anyone to smoke in your home. General instructions Take over-the-counter and prescription medicines only as told by your doctor. Talk with your doctor if you have questions about how or when to take your medicines. Make note if you need to use your medicines more often than usual. Do not use any products that contain nicotine or tobacco, such as cigarettes and e-cigarettes. If you need help quitting, ask your doctor. Stay away from secondhand smoke. Avoid doing things outdoors when allergen counts are high and when air quality is low. Wear a ski mask when doing outdoor activities in the winter. The mask should cover your nose and mouth. Exercise indoors on cold days if you can. Warm up before you exercise. Take time to cool down after exercise. Use a peak flow meter as   told by your doctor. A peak flow meter is a tool that measures how well the lungs are working. Keep track of the peak flow meter's readings. Write them down. Follow your asthma action plan. This is a written plan for taking care  of your asthma and treating your attacks. Make sure you get all the shots (vaccines) that your doctor recommends. Ask your doctor about a flu shot and a pneumonia shot. Keep all follow-up visits as told by your doctor. This is important. Contact a doctor if: You have wheezing, shortness of breath, or a cough even while taking medicine to prevent attacks. The mucus you cough up (sputum) is thicker than usual. The mucus you cough up changes from clear or white to yellow, green, gray, or bloody. You have problems from the medicine you are taking, such as: A rash. Itching. Swelling. Trouble breathing. You need reliever medicines more than 2-3 times a week. Your peak flow reading is still at 50-79% of your personal best after following the action plan for 1 hour. You have a fever. Get help right away if: You seem to be worse and are not responding to medicine during an asthma attack. You are short of breath even at rest. You get short of breath when doing very little activity. You have trouble eating, drinking, or talking. You have chest pain or tightness. You have a fast heartbeat. Your lips or fingernails start to turn blue. You are light-headed or dizzy, or you faint. Your peak flow is less than 50% of your personal best. You feel too tired to breathe normally. Summary Asthma is a long-term (chronic) condition in which the airways get tight and narrow. An asthma attack can make it hard to breathe. Asthma cannot be cured, but medicines and lifestyle changes can help control it. Make sure you understand how to avoid triggers and how and when to use your medicines. This information is not intended to replace advice given to you by your health care provider. Make sure you discuss any questions you have with your healthcare provider. Document Revised: 02/19/2020 Document Reviewed: 02/19/2020 [employer] Patient Education  2022 [employer]  

## 2021-07-30 ENCOUNTER — Other Ambulatory Visit: Payer: Self-pay

## 2021-07-30 DIAGNOSIS — J4540 Moderate persistent asthma, uncomplicated: Secondary | ICD-10-CM

## 2021-07-30 MED ORDER — CETIRIZINE HCL 5 MG/5ML PO SOLN: 15.0000 mg | Freq: Every day | ORAL | 5 refills | Status: AC

## 2021-08-05 ENCOUNTER — Ambulatory Visit: Payer: Medicare Other | Admitting: Nurse Practitioner

## 2021-08-09 ENCOUNTER — Ambulatory Visit: Payer: Medicare Other | Admitting: Nurse Practitioner

## 2021-12-14 ENCOUNTER — Encounter: Payer: Self-pay | Admitting: Internal Medicine

## 2022-01-04 ENCOUNTER — Ambulatory Visit: Payer: Medicare Other | Admitting: Internal Medicine

## 2022-06-16 ENCOUNTER — Telehealth: Payer: Self-pay

## 2022-06-16 NOTE — Telephone Encounter (Signed)
He has another pcp in his chart

## 2022-06-21 ENCOUNTER — Telehealth: Payer: Self-pay

## 2022-06-21 NOTE — Telephone Encounter (Signed)
Lvm to schedule follow up appointment with dsk[facility]Toni

## 2022-07-19 ENCOUNTER — Ambulatory Visit: Payer: Medicare Other | Admitting: Internal Medicine

## 2022-08-02 ENCOUNTER — Ambulatory Visit: Payer: Medicare Other | Admitting: Internal Medicine

## 2022-08-16 ENCOUNTER — Ambulatory Visit: Payer: Medicare Other | Admitting: Internal Medicine

## 2022-08-18 ENCOUNTER — Ambulatory Visit: Payer: Medicare Other | Admitting: Internal Medicine

## 2022-08-22 ENCOUNTER — Ambulatory Visit: Payer: Medicare Other | Admitting: Nurse Practitioner

## 2022-09-13 ENCOUNTER — Ambulatory Visit: Payer: Medicare Other | Admitting: Internal Medicine

## 2022-11-08 ENCOUNTER — Ambulatory Visit: Payer: 59 | Admitting: Internal Medicine

## 2022-11-22 ENCOUNTER — Ambulatory Visit: Payer: 59 | Admitting: Internal Medicine

## 2022-12-20 ENCOUNTER — Ambulatory Visit: Payer: 59 | Admitting: Internal Medicine

## 2023-01-03 ENCOUNTER — Ambulatory Visit: Payer: 59 | Admitting: Internal Medicine

## 2023-01-03 ENCOUNTER — Telehealth: Payer: Self-pay | Admitting: Internal Medicine

## 2023-01-03 NOTE — Telephone Encounter (Signed)
Patient called to reschedule appointment 3 minutes before his appointment even after he was given warning that if he did not come for today's appointment, he would be discharged from [facility]
